# Patient Record
Sex: Male | Born: 1935 | Race: White | Hispanic: No | Marital: Married | Smoking: Never smoker
Health system: Southern US, Community
[De-identification: ages and names within clinical notes are randomized; demographics above are authoritative.]

## PROBLEM LIST (undated history)

## (undated) DIAGNOSIS — E669 Obesity, unspecified: Secondary | ICD-10-CM

## (undated) DIAGNOSIS — E875 Hyperkalemia: Secondary | ICD-10-CM

## (undated) DIAGNOSIS — Z96659 Presence of unspecified artificial knee joint: Secondary | ICD-10-CM

## (undated) DIAGNOSIS — T8459XA Infection and inflammatory reaction due to other internal joint prosthesis, initial encounter: Secondary | ICD-10-CM

## (undated) DIAGNOSIS — R001 Bradycardia, unspecified: Secondary | ICD-10-CM

## (undated) DIAGNOSIS — N183 Chronic kidney disease, stage 3 unspecified: Secondary | ICD-10-CM

## (undated) DIAGNOSIS — E785 Hyperlipidemia, unspecified: Secondary | ICD-10-CM

## (undated) DIAGNOSIS — K922 Gastrointestinal hemorrhage, unspecified: Secondary | ICD-10-CM

## (undated) DIAGNOSIS — I2510 Atherosclerotic heart disease of native coronary artery without angina pectoris: Secondary | ICD-10-CM

## (undated) DIAGNOSIS — F419 Anxiety disorder, unspecified: Secondary | ICD-10-CM

## (undated) DIAGNOSIS — I482 Chronic atrial fibrillation, unspecified: Secondary | ICD-10-CM

## (undated) DIAGNOSIS — D649 Anemia, unspecified: Secondary | ICD-10-CM

## (undated) DIAGNOSIS — N289 Disorder of kidney and ureter, unspecified: Secondary | ICD-10-CM

## (undated) DIAGNOSIS — I10 Essential (primary) hypertension: Secondary | ICD-10-CM

## (undated) DIAGNOSIS — E7800 Pure hypercholesterolemia, unspecified: Secondary | ICD-10-CM

## (undated) HISTORY — PX: CORONARY ARTERY BYPASS GRAFT: SHX141

## (undated) HISTORY — PX: TONSILLECTOMY: SUR1361

## (undated) HISTORY — DX: Hyperkalemia: E87.5

## (undated) HISTORY — PX: LEG SURGERY: SHX1003

## (undated) HISTORY — PX: APPENDECTOMY: SHX54

## (undated) HISTORY — PX: FOOT SURGERY: SHX648

## (undated) HISTORY — DX: Anemia, unspecified: D64.9

## (undated) HISTORY — DX: Disorder of kidney and ureter, unspecified: N28.9

## (undated) HISTORY — PX: JOINT REPLACEMENT: SHX530

---

## 1999-04-09 ENCOUNTER — Ambulatory Visit (HOSPITAL_COMMUNITY): Admission: RE | Admit: 1999-04-09 | Discharge: 1999-04-09 | Payer: Self-pay | Admitting: Cardiovascular Disease

## 2000-05-04 ENCOUNTER — Emergency Department (HOSPITAL_COMMUNITY): Admission: EM | Admit: 2000-05-04 | Discharge: 2000-05-04 | Payer: Self-pay | Admitting: Emergency Medicine

## 2000-06-24 ENCOUNTER — Encounter: Payer: Self-pay | Admitting: Physical Medicine and Rehabilitation

## 2000-06-24 ENCOUNTER — Ambulatory Visit (HOSPITAL_COMMUNITY)
Admission: RE | Admit: 2000-06-24 | Discharge: 2000-06-24 | Payer: Self-pay | Admitting: Physical Medicine and Rehabilitation

## 2000-07-08 ENCOUNTER — Encounter: Payer: Self-pay | Admitting: Physical Medicine and Rehabilitation

## 2000-07-08 ENCOUNTER — Ambulatory Visit (HOSPITAL_COMMUNITY)
Admission: RE | Admit: 2000-07-08 | Discharge: 2000-07-08 | Payer: Self-pay | Admitting: Physical Medicine and Rehabilitation

## 2001-11-02 ENCOUNTER — Ambulatory Visit (HOSPITAL_COMMUNITY)
Admission: RE | Admit: 2001-11-02 | Discharge: 2001-11-02 | Payer: Self-pay | Admitting: Physical Medicine and Rehabilitation

## 2001-11-03 ENCOUNTER — Encounter: Payer: Self-pay | Admitting: Physical Medicine and Rehabilitation

## 2001-11-03 ENCOUNTER — Encounter: Payer: Self-pay | Admitting: Cardiovascular Disease

## 2001-11-03 ENCOUNTER — Ambulatory Visit (HOSPITAL_COMMUNITY)
Admission: RE | Admit: 2001-11-03 | Discharge: 2001-11-03 | Payer: Self-pay | Admitting: Physical Medicine and Rehabilitation

## 2004-12-24 ENCOUNTER — Ambulatory Visit: Payer: Self-pay | Admitting: Gastroenterology

## 2006-09-14 ENCOUNTER — Encounter: Admission: RE | Admit: 2006-09-14 | Discharge: 2006-09-14 | Payer: Self-pay | Admitting: Orthopedic Surgery

## 2006-11-18 ENCOUNTER — Inpatient Hospital Stay (HOSPITAL_COMMUNITY): Admission: RE | Admit: 2006-11-18 | Discharge: 2006-11-26 | Payer: Self-pay | Admitting: Orthopedic Surgery

## 2009-01-27 IMAGING — CR DG CHEST 2V
2 series · 2 of 2 positions shown · non-contrast
Comparison: No prior studies are available for comparison.

CLINICAL DATA: Loose left THR. CABG. Some shortness of breath.
 CHEST - 2 VIEW:

[w chest pa *]
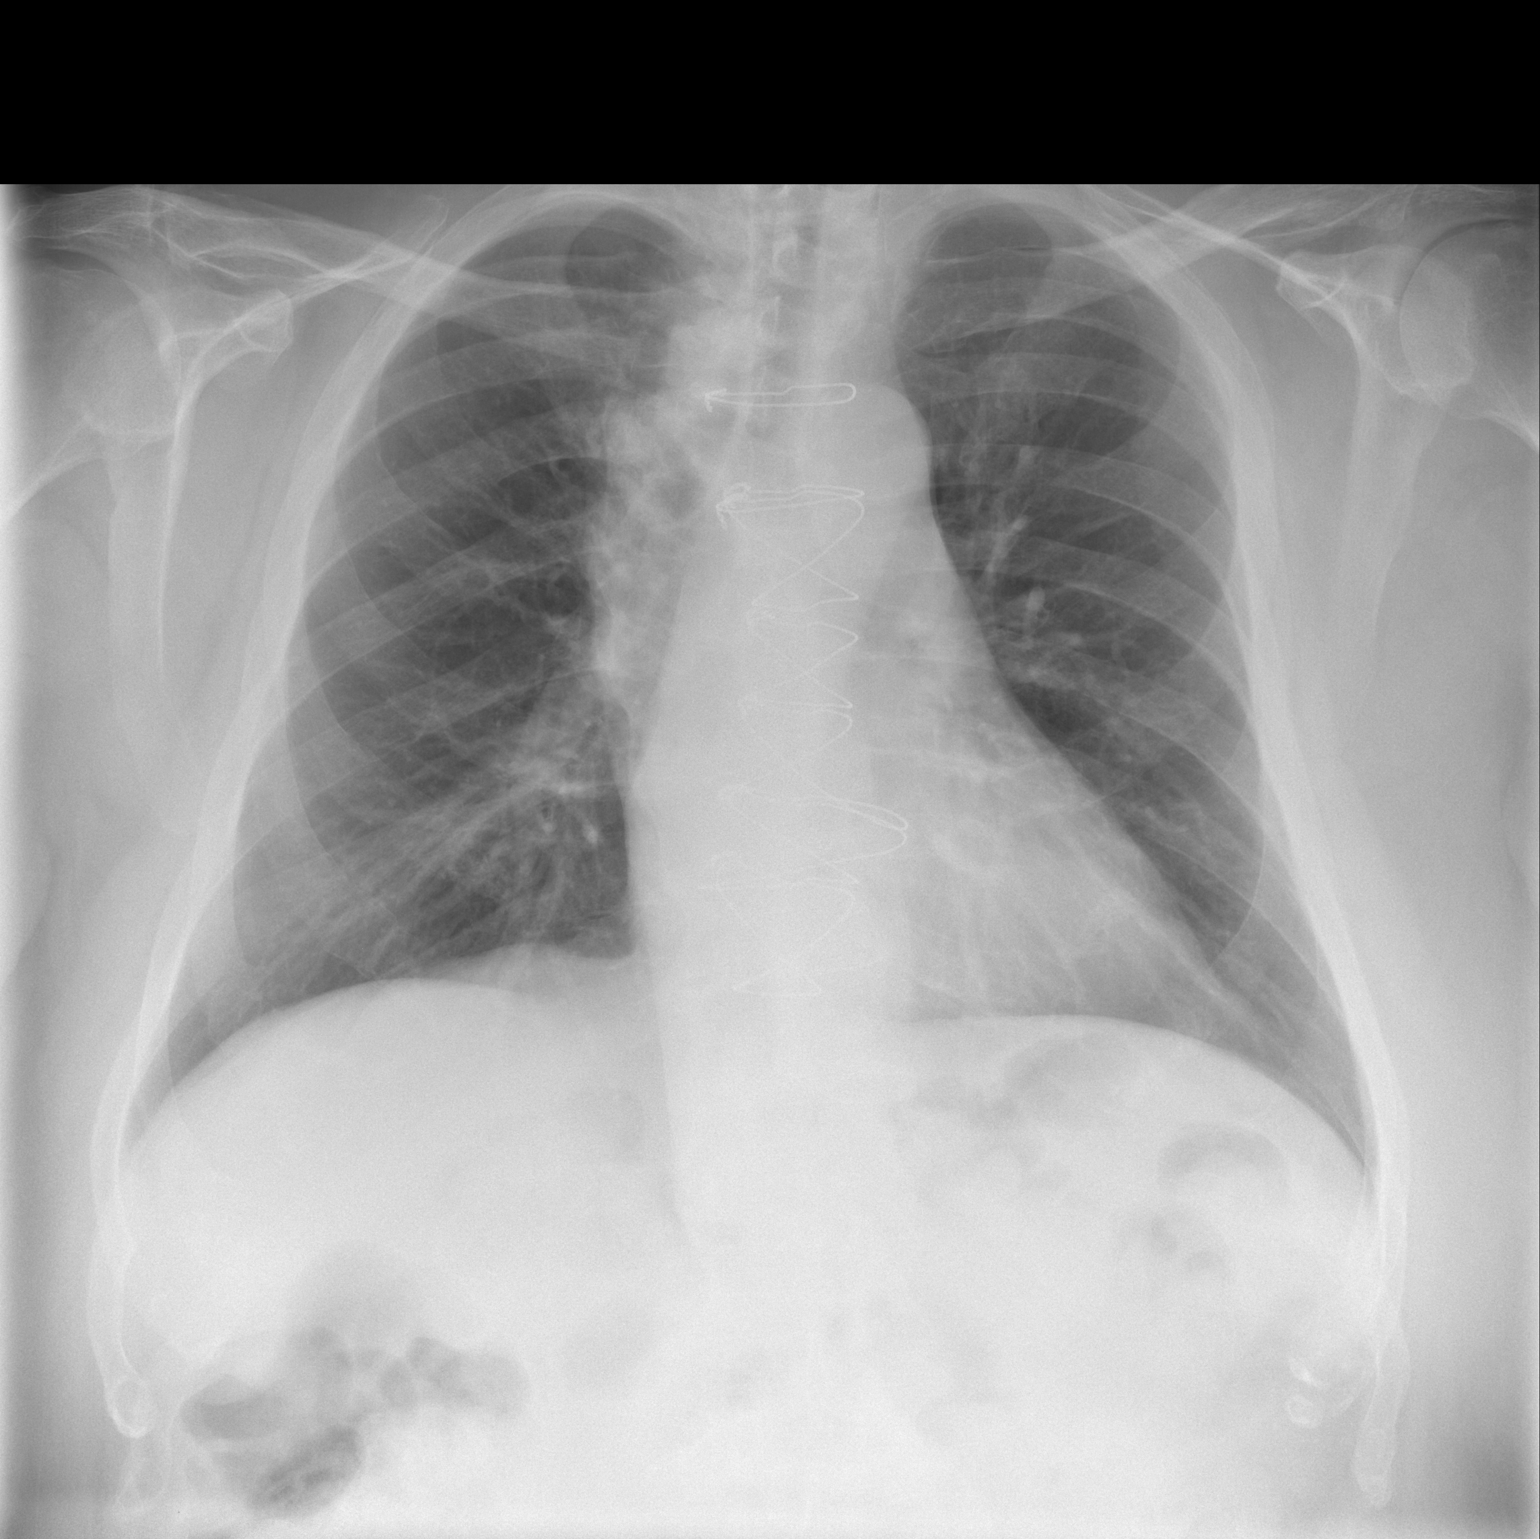

[w chest lat *]
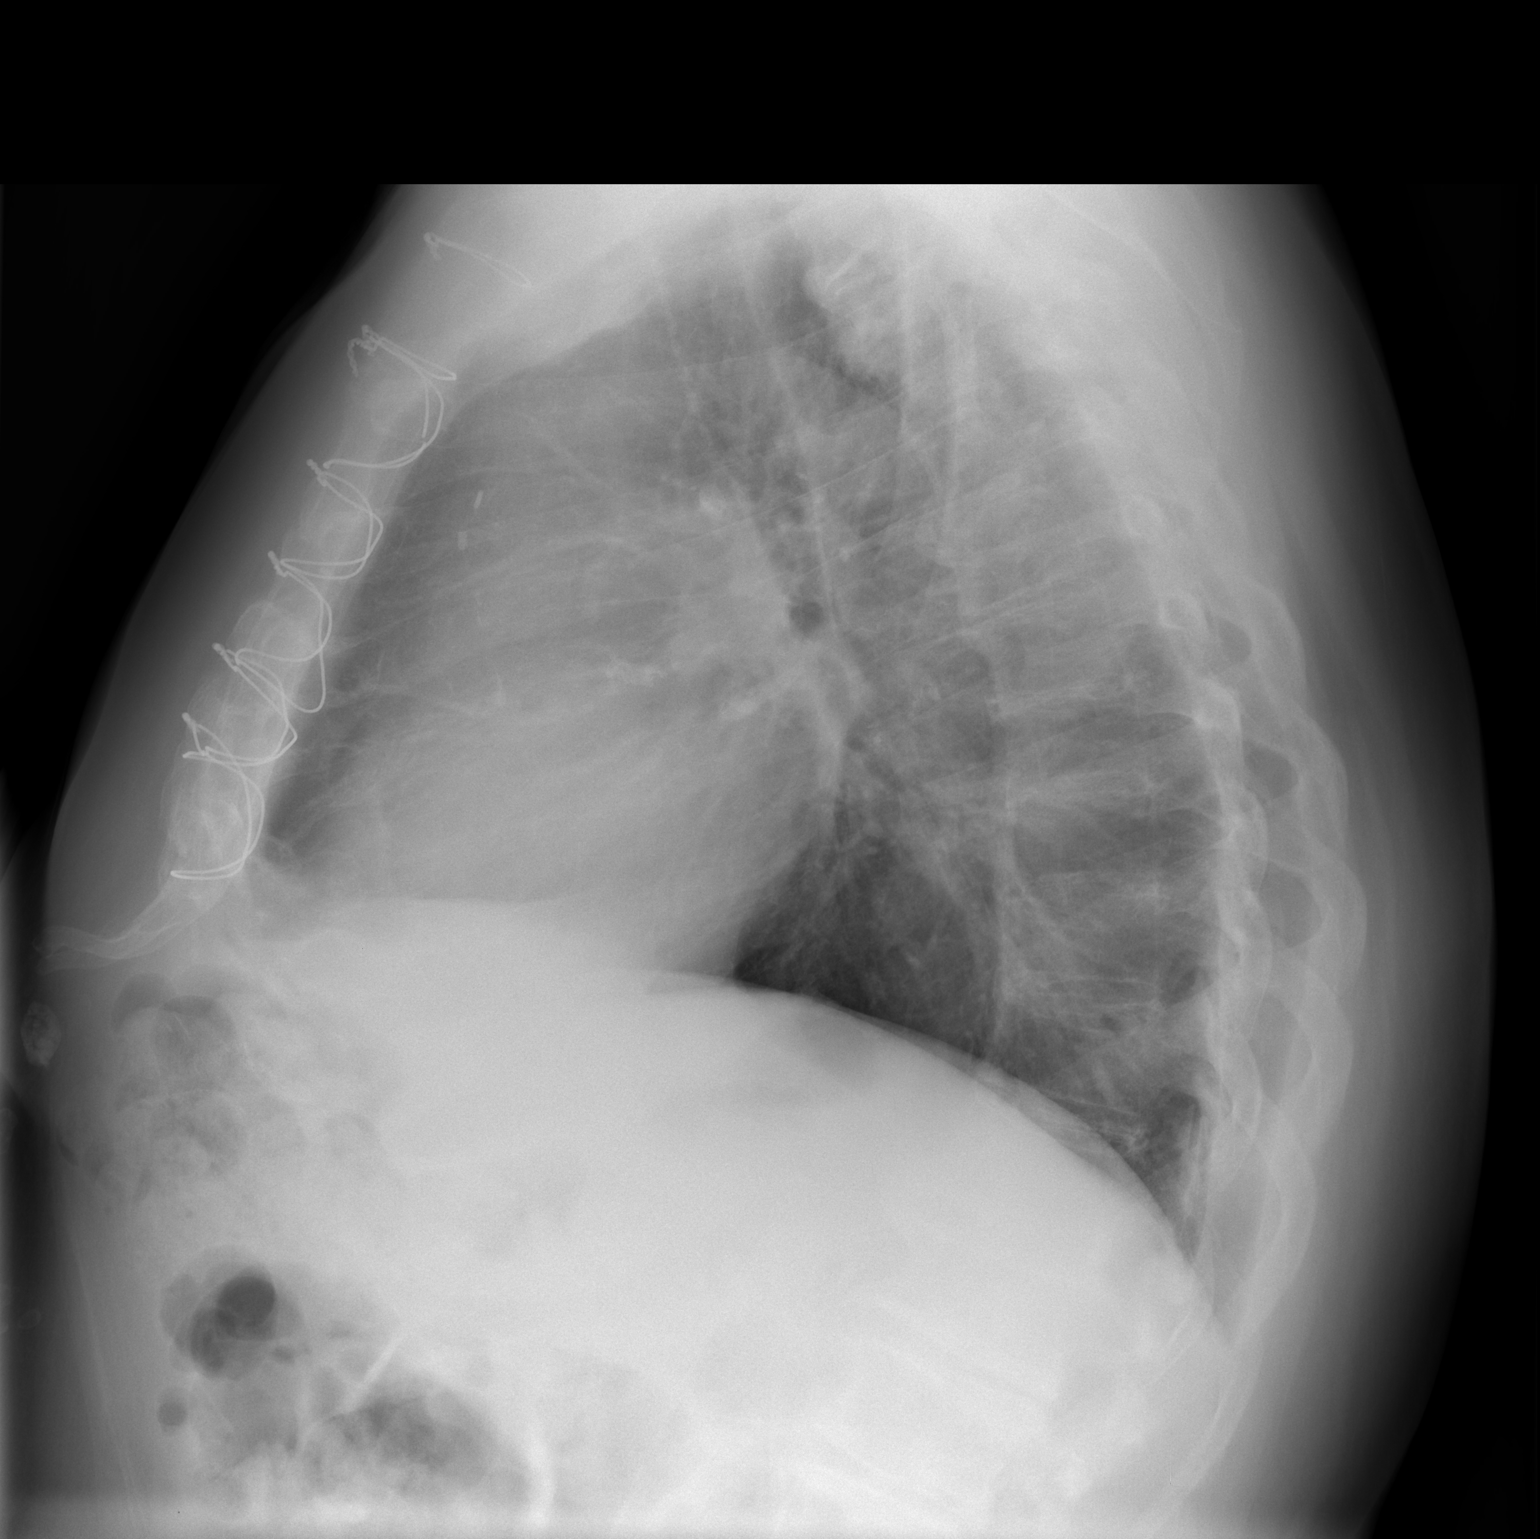

[2 of 2 positions shown; findings below may reference images not displayed]

FINDINGS: The cardiomediastinal silhouette appears unremarkable.  Cardiac size normal.  No pulmonary vascular congestion or active lung process.  Degenerative spondylotic changes in the thoracic spine. Sternal wire sutures and mediastinal clips.
IMPRESSION: No active chest disease. Moderate hyperaeration of the lungs.

## 2009-06-13 ENCOUNTER — Encounter: Admission: RE | Admit: 2009-06-13 | Discharge: 2009-06-13 | Payer: Self-pay | Admitting: Internal Medicine

## 2009-08-22 ENCOUNTER — Ambulatory Visit (HOSPITAL_COMMUNITY): Admission: RE | Admit: 2009-08-22 | Discharge: 2009-08-23 | Payer: Self-pay | Admitting: Orthopedic Surgery

## 2010-04-05 LAB — POCT I-STAT 4, (NA,K, GLUC, HGB,HCT)
Glucose, Bld: 89 mg/dL (ref 70–99)
Hemoglobin: 13.3 g/dL (ref 13.0–17.0)

## 2010-04-06 LAB — CBC
HCT: 36.4 % — ABNORMAL LOW (ref 39.0–52.0)
Hemoglobin: 12.1 g/dL — ABNORMAL LOW (ref 13.0–17.0)
MCH: 31.3 pg (ref 26.0–34.0)
MCHC: 33.2 g/dL (ref 30.0–36.0)
Platelets: 142 10*3/uL — ABNORMAL LOW (ref 150–400)
RBC: 3.86 MIL/uL — ABNORMAL LOW (ref 4.22–5.81)
RDW: 15.1 % (ref 11.5–15.5)
WBC: 6.1 10*3/uL (ref 4.0–10.5)

## 2010-04-06 LAB — DIFFERENTIAL
Eosinophils Relative: 4 % (ref 0–5)
Lymphocytes Relative: 17 % (ref 12–46)
Monocytes Absolute: 0.6 10*3/uL (ref 0.1–1.0)
Monocytes Relative: 10 % (ref 3–12)
Neutro Abs: 4.2 10*3/uL (ref 1.7–7.7)
Neutrophils Relative %: 68 % (ref 43–77)

## 2010-04-06 LAB — COMPREHENSIVE METABOLIC PANEL
ALT: 17 U/L (ref 0–53)
AST: 21 U/L (ref 0–37)
Albumin: 3.8 g/dL (ref 3.5–5.2)
Chloride: 109 mEq/L (ref 96–112)
GFR calc Af Amer: 44 mL/min — ABNORMAL LOW (ref >60)
GFR calc non Af Amer: 36 mL/min — ABNORMAL LOW (ref >60)
Glucose, Bld: 94 mg/dL (ref 70–99)
Total Bilirubin: <0.1 mg/dL — ABNORMAL LOW (ref 0.3–1.2)
Total Protein: 7 g/dL (ref 6.0–8.3)

## 2010-04-06 LAB — PROTIME-INR: Prothrombin Time: 14.2 seconds (ref 11.6–15.2)

## 2010-04-06 LAB — URINALYSIS, ROUTINE W REFLEX MICROSCOPIC
Bilirubin Urine: NEGATIVE
Hgb urine dipstick: NEGATIVE
Ketones, ur: NEGATIVE mg/dL
Nitrite: NEGATIVE
Urobilinogen, UA: 0.2 mg/dL (ref 0.0–1.0)
pH: 6 (ref 5.0–8.0)

## 2010-04-06 LAB — APTT: aPTT: 29 seconds (ref 24–37)

## 2010-04-06 LAB — SURGICAL PCR SCREEN: Staphylococcus aureus: NEGATIVE

## 2010-06-04 NOTE — Op Note (Signed)
NAMEKERMAN, PFOST                  ACCOUNT NO.:  [id number]   MEDICAL RECORD NO.:  [id number]          PATIENT TYPE:  INP   LOCATION:  1603                         FACILITY:  [facility]   PHYSICIAN:  Georges Lynch. Gioffre, M.D.DATE OF BIRTH:  [birth year]   DATE OF PROCEDURE:  11/18/2006  DATE OF DISCHARGE:                               OPERATIVE REPORT   SURGEON:  Georges Lynch. Darrelyn Hillock, M.D.   ASSISTANTS:  Jamelle Rushing, P.A., and Madlyn Frankel. Charlann Boxer, M.D.   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  He has had a left total knee (1995) and apparently a trailer hitch fell  and struck his knee and threw his knee into a deformed state and  loosened his prosthesis.   POSTOPERATIVE DIAGNOSIS:  Loosened and worn left total knee  arthroplasty.  He has had a left total knee (1995) and apparently a  trailer hitch fell and struck his knee and threw his knee into a  deformed state and loosened his prosthesis.   OPERATION:  1. Removal of a loosened and worn total knee arthroplasty on the left.  2. Insertion of a revision DePuy-type arthroplasty.  All three      components were cemented.  Note, we did cement the stems      bilaterally.   PROCEDURE:  Under general anesthesia, a routine orthopedic prep and  draping of the left lower extremity carried out.  The leg was  exsanguinated with an Esmarch.  The tourniquet was elevated to 350 mmHg.  An incision was made down through the old incision site.  We removed as  many as the old Ethibond sutures that we could at this time.  I then  carried out a median parapatellar incision.  I then had to do a  quadriceps snip and displaced the patella laterally.  A pin was placed  in the patellar tendon to prevent any dislodgement of the patellar  tendon from the tibial tubercle.  The knee was flexed and the implants  were noted to be loosened.  We easily removed the implants and the  cement.  We left the patella intact because the patella  was solidly  fixed.  Even though there were multiple fragments of bone which were old  fracture site, we decided not to remove that in order to disturb the  extensor mechanism.  We did debride the synovium around the patella.  At  this time we thoroughly curetted and debrided out knee first.  We  curetted out all the cystic changes in the tibia and femur.  We then  reamed our femur and tibia up in the usual procedure.  We decided to use  sleeves on both the femur and proximal tibia.  We utilized a modular  cemented stem, which was 13 x 30, for the femur.  The cement restricter  we used with a size 6 for the femur.  We utilized a TC3  femoral  component, a size 5.  The femoral adapter bolt was a +2 offset.  The  tibia cemented stem was a 15 x 60.  The tibia cement restricter was a  size 5.  Vancomycin was used in the cement.  The femoral sleeve was a  size 31 mm.  We utilized an 8-mm posterior augment on the femur.  This  was on the posterior medial posterior aspect of the femoral component.  We utilized a revision cemented mobile bearing tibial tray, size 4.  The  tibial sleeve was a size 29 mm.  The tibial insert was a size 5, 15-mm  thickness insert.    Under general anesthesia, routine orthopedic prep and draping of the  left lower extremity was carried out.  As I mentioned, the leg was  exsanguinated with Esmarch, tourniquet was elevated at 350 mmHg.  We  went down and did the procedures I mentioned above.  We did a median  parapatellar approach.  We did a quadriceps snip, reflected the patella  and removed all the components, curetted out the area.  We then did our  appropriate reaming for the femur and tibia in the usual fashion to  accommodate the above-mentioned sizes of the prostheses and stem.  We  obviously went through the trials.  We did utilize our broach for the  distal femur.  We also utilized a sleeve for distal femur, as I  mentioned, and the proximal tibia.  Once we  went through all the trials  and selected our sizes as mentioned above, we then removed the trial  components.  We did utilize a few pins for fixation purposes for our  lateral femoral condyle, which was fractured off from his fall.  We  anatomically reduced that and pinned it in place.  We then thoroughly  water-picked out the knee, cleaned the knee out, dried the knee out,  cemented both components in simultaneously.  We did cement both stems as  well.  Once the cement was hardened, we then removed all loose pieces of  cement.  We went through trials with the tibial insert, selected a 15-mm  thickness insert, size 5.  The synovium around the patella was debrided.  We inspected the patella.  It was solidly fixed so we decided not to  disturb it.  We thoroughly irrigated out the area, inserted a Hemovac  drain after we injected 30 mL of 0.25% Marcaine with Toradol.  We  injected that in the soft tissue structures.  The tourniquet then was  let down.  Following that we went ahead and inserted our Hemovac drain  and closed the knee in layers in the usual fashion.  Sterile dressings  were applied.  He had 1 g of IV vancomycin preop.           ______________________________  Georges Lynch. Darrelyn Hillock, M.D.     RAG/MEDQ  D:  11/18/2006  T:  11/18/2006  Job:  [id number]   cc:   Georga Hacking, M.D.  Fax: [phone]  Email: [email or web address]

## 2010-06-04 NOTE — H&P (Signed)
Joe [name], Joe [name]                  ACCOUNT NO.:  [id number]   MEDICAL RECORD NO.:  [id number]          PATIENT TYPE:  INP   LOCATION:  NA                           FACILITY:  [facility]   PHYSICIAN:  Georges Lynch. Gioffre, M.D.DATE OF BIRTH:  [birth year]   DATE OF ADMISSION:  11/18/2006  DATE OF DISCHARGE:                              HISTORY & PHYSICAL   CHIEF COMPLAINT:  Painful loss of range of motion, left total knee  arthroplasty.   HISTORY OF PRESENT ILLNESS:  The patient is a 75 year old gentleman with  a history of left total knee arthroplasty.  He states that, back in  August, he was doing some work, stepped off of trailer.  The hitch came  down, banged him in his left knee.  He had pain and swelling with range  of motion.  X-rays reveal that he has impaction of the femoral component  up into the femoral condyles with loss of space and a comminuted  fracture of his left patella.  The patient was allowed several months  for the patella to heel and will require revision of his left total knee  arthroplasty with complete revision.  The patient continues have pain  and loss of range of motion.   ALLERGIES:  BIAXIN.   CURRENT MEDICATIONS:  1. Aspirin 325 mg a day.  2. Isosorbide mononitrate 60 mg a day.  3. Lipitor 20 mg a day.  4. Mobic 15 mg a day.  Will stop today.  5. Singulair 10 mg once a day.  He has not used it recently.  6. Darvocet 1 or 2 tablets every 4 to 6 hours p.r.n. pain.  7. Percocet 5 mg, 1 or 2 tablets every 4 to 6 hours for severe pain.  8. Triamterine/hydrochlorothiazide 37.5/25 mg a day.  9. Alprazolam 0.05 mg q.h.s. p.r.n.  10.Nitroglycerin sublingual p.r.n.   PAST MEDICAL HISTORY:  1. Includes history of MI and CABG in 2000, with recent stress test      clearance for this upcoming surgery.  2. History of asthma.  3. History of a bilateral lower extremity staph infection.  4. Hypertension.  5. Hypercholesterolemia.   PAST SURGICAL HISTORY:  Includes back  surgery, tonsillectomy, carpal  tunnel release, bilateral knee replacements, coronary artery grafting.  The patient denies any complications of the above-mentioned surgical  procedures.   REVIEW OF SYSTEMS:  Is negative for any neurologic.  He has not had any  recent asthma attacks, the last was greater than 1 year ago.  He has  never been hospitalized.  CARDIOVASCULAR:  He states he has used one  nitroglycerin, probably a couple months ago.  He used it very  infrequently.  No recent chest pain, shortness of breath.  GI:  He  denies any ulcers.  No gallstones, liver problems, hepatitis.  GU:  He  just has weak stream.  He has never been treated for BPH.  No frequent  infections or kidney stones.  He denies any endocrine. HEMATOLOGIC:  He  has had blood transfusions with his knee replacements and heart surgery  in the  past, without any untoward events.   SOCIAL HISTORY:  The patient is married, lives with his wife.  He is  partially retired.  Drinks about one beer a day.  Does not smoke will  have his wife care for him post-op on a one-story house.   FAMILY MEDICAL HISTORY:  Mother is deceased from history of heart  disease and infection.  Father is deceased from Alzheimer's  complications.   PHYSICAL EXAM:  VITALS:  Height is 580, weight is 265, blood pressure is  178/82, re-checked unchanged.  Pulse of 70, respirations 12.  Patient is  afebrile.  GENERAL:  This is a centrally obese gentleman, conscious, alert and  appropriate with a knee brace on his left knee, ambulating with a cane  in his right hand.  HEENT:  Head was normocephalic.  Pupils equal, round and reactive.  Extraocular movements intact.  Oral buccal mucosa is pink and moist.  Gross hearing is intact.  NECK:  Was supple.  No palpable lymphadenopathy.  Thyroid region was  nontender.  Good range of motion.  CHEST:  Lung sounds were clear and equal bilaterally.  No wheezes,  rales, rhonchi.  HEART:  Regular rate and  rhythm.  He had a well-healed midline surgical  incision.  ABDOMEN:  Round, obese, soft, nontender.  Bowel sounds present.  EXTREMITIES:  Upper extremities had good range of motion, no __________  .  He had 5/5 motor strength.  LOWER EXTREMITIES:  Right left hip had full extension, flexion up to 120  with 20 degrees internal-external rotation without any discomfort.  Bilateral knees had well-healed midline surgical incisions.  Left knee:  He had no effusion.  No erythema or ecchymosis.  He was able to extend  it about 15 degrees short of full extension, flexes it back to about 80  degrees.  He did have some slight play with valgus varus stressing.  Right knee:  He was able to fully extend it, flex it back to 115  degrees.  No instability.  Calves were soft and nontender.  He had  multiple varicosities in lower extremity.  Ankles with good motion.  PERIPHERAL VASCULAR:  Carotid pulses were 2+, no bruits.  Radial pulses  were 2+.  Dorsalis pedis pulses were 1+.  He had 1+ pitting edema in the  left lower extremity.  He had multiple varicosities.  NEURO:  The patient is conscious, alert and appropriate nose.  Gross  neurologic defects noted.  BREAST, RECTAL AND GENITOURINARY:  Exams were deferred at this time.   IMPRESSION:  1. Loosening with impacted left femoral component in a total knee      arthroplasty, requiring revision.  2. Hypertension.  3. Coronary artery disease with history of coronary artery bypass      graft, with recent stress test for clearance.  4. History of asthma.  5. History of hypercholesterolemia.   PLAN:  The patient will undergo all routine labs and tests, prior to  having a revision of his left total knee arthroplasty by Dr. Ciro Backer at  [facility] on November 18, 2006.  The patient has had a recent  normal two-day adenosine Cardiolite scan, with no evidence of ischemia  or infarction, normal ejection fraction of 57%, with normal wall motion  and  thickening.  This was performed by Dr. Donnie Aho, his primary care  physician, Dr. Jacky Kindle.      Jamelle Rushing, P.A.    ______________________________  Georges Lynch Darrelyn Hillock, M.D.  RWK/MEDQ  D:  11/09/2006  T:  11/09/2006  Job:  [id number]   cc:   Georges Lynch. Darrelyn Hillock, M.D.  Fax: [phone]

## 2010-06-07 NOTE — Discharge Summary (Signed)
Joe [name], Joe [name]                  ACCOUNT NO.:  [id number]   MEDICAL RECORD NO.:  [id number]          PATIENT TYPE:  INP   LOCATION:  1603                         FACILITY:  [facility]   PHYSICIAN:  Georges Lynch. Gioffre, M.D.DATE OF BIRTH:  1935/01/21   DATE OF ADMISSION:  11/18/2006  DATE OF DISCHARGE:  11/26/2006                               DISCHARGE SUMMARY   ADMISSION DIAGNOSES:  __________  Blood-loss anemia, six units of packed red blood cells.  Hypertension.  Coronary artery disease, status post coronary artery bypass grafting.  __________   This patient is a 75 year old __________   SURGICAL PROCEDURE:  On November 18, 2006, patient was taken to the OR  __________  Blood loss.  He was given 2 units of packed red cells during the  procedure, due to the fact that he __________  probably bleed  excessively both postoperative and the condition of coronary artery  disease.   Tibial tray, size 15 X 60 tibial cemented stem, 29 mm tray sleeve and 31  mm femoral sleeve, a size 5 15 mm polyethylene bearing of polymethyl  methacrylate.   CONSULTATIONS:  __________  request for physical therapy consultation  __________   HOSPITAL COURSE:  On November 18, 2006, patient was taken to the OR for  revision of his left total knee arthroplasty.  The patient tolerated it  well.  He did receive two units of packed red blood cells during the  procedure.  Patient then did incur two more episodes of postoperative  blood-loss anemia.  Initially, his first postoperative day, his  hemoglobin dropped to 8.8, so he was typed and crossed and transfused  two units of packed red blood cells.  Then just prior to discharge, his  hemoglobin also dropped down to 6.8 and he was typed and crossed and  transfused two more units.  On discharge, his hemoglobin was at 11 with  hematocrit of 37__________  the patient's INR did become slightly  supratherapeutic, as suggested by the pharmacy, it was 2.9 on the date  of discharge.  Patient otherwise had a significant amount of bleeding  the first few days, due to the extensive revision in his knee and this  was felt to be expected.  Patient had no signs of infection __________  Robaxin to be intact.  Patient was able to transition from IV  medications to p.o. meds.  Once the patient had his hemoglobin  stabilized and anticipated by physical therapy, he was felt to be stable  for discharge home.  He was stable and in good condition.  Arrangements  were made for outpatient physical therapy and __________  .   LABS:  CBC on admission:  WBC 6.2, hemoglobin 13, __________ 58.1,  platelets 276.  The first postoperative day, hemoglobin dropped to 8.8.  he was transfused 2 units of packed red blood cells and did improve and,  just prior to discharge, his hemoglobin dropped to 6.8/19.8, transfused  2 more units of packed red blood cells and improved to 11/30.7.  The  patient's INR was 2.9 the date of discharge.  The pharmacy had suggested  that dosage.  The patient's chemistries on November 3 were sodium 134,  potassium 4, glucose 109, BUN 16, creatinine 1.2, GFR greater than 60.  Patient received a total of 6 units of packed red blood cells during  this hospitalization.   Patient's intraoperative specimens that were sent to the pathology lab  for evaluation for possible infection showed no signs of any growth or  organisms.   DISCHARGE INSTRUCTIONS:  DIET:  No restrictions.  ACTIVITY:  Patient is to slowly increase activity, walk with assistance  of a walker.  WOUND CARE:  Patient is to change his dressing daily.  FOLLOWUP:  Patient is to follow up with Dr. Darrelyn Hillock two weeks from  discharge.  Patient is to call [phone] for an appointment.   MEDICATIONS UPON DISCHARGE:  1. Percocet 10/650 one tablet every 4-6 hours for pain, if needed.  2. Coumadin 5 mg one tablet a day unless changed by pharmacy.  3. Robaxin 500 mg one tablet every 6 hours for muscle  spasms, if      needed, every 6 hours.  4. Colchicine 0.6 mg twice a day.  5. Lipitor 20 mg a day.  6. Triamterene/hydrochlorothiazide 37.5/25 mg p.r.n.  7. Isosorbide mononitrate 60 mg once a day.  8. Darvocet on hold.  9. Alprazolam 0.25 mg as needed.  10.NitroQuick sublingual 0.4 mg as needed.  11.Mobic is to be on hold until done with Coumadin.   CONDITION ON DISCHARGE:  The patient's condition upon discharge to home  is listed as improved and good.      Jamelle Rushing, P.A.    ______________________________  Georges Lynch Darrelyn Hillock, M.D.    RWK/MEDQ  D:  12/09/2006  T:  12/09/2006  Job:  [id number]

## 2010-06-07 NOTE — Cardiovascular Report (Signed)
[place]  Patient:    Joe [name], Joe [name]                         MRN: [id number] Proc. Date: 04/09/99 Adm. Date:  [id number] Disc. Date: [id number] Attending:  Koren Bound CC:         Cardiac Catheterization Laboratory             Richard A. Jacky Kindle, M.D.                        Cardiac Catheterization  PROCEDURE:  Heart catheterization.  CARDIOLOGIST:  Alvia Grove., M.D.  INDICATIONS:  Joe [name] is a 75 year old gentleman with a history of a coronary artery bypass grafting at [facility].  He had two-vessel coronary artery disease by heart catheterization back in 1995.  He went to [facility] and had a saphenous vein graft to his diagonal and LAD sequentially, and then a saphenous vein graft to his right coronary artery.  He presented to the office last week ith a months long history of progressive chest tightness and shortness of breath. e is referred for a heart catheterization, for further evaluation.  DESCRIPTION OF PROCEDURE:  The right femoral artery was cannulated using a modified Seldinger technique.  HEMODYNAMICS: LV pressure:  125/16. Aortic pressure:  121/71.  ANGIOGRAPHY: 1. Left main coronary artery:  The left main is rather long. 2. Left anterior descending coronary artery:  The LAD is occluded proximally. 3. Circumflex coronary artery:  The circumflex artery has only minor luminal    irregularities.  There is an OM-I that is fairly normal. 4. Right coronary artery:  Is occluded proximally. 5. Saphenous vein graft to the first diagonal artery and then the left anterior    descending coronary artery:  Is fairly normal.  The anastomosis to the first    diagonal artery has minor irregularities, but no critical stenosis.  The    anastomosis to the LAD is normal.  Flow down into the LAD is quite normal,    and this vessel appears to be normal distal to the graft.  It is clear to    say that the  LAD has an 80% stenosis in the very proximal aspect of this    vessel as it fills in a retrograde fashion. 6. Saphenous vein graft to the right coronary artery:  Is a very large and    somewhat ectatic vessel.  It was fairly difficult to fill this large artery    with the catheter, but the flow through it appears to be normal.  This graft    appears to be 5.0 to 6.0 mm in diameter.  The anastomosis to the distal    right coronary artery appears to be normal.  The flow into the PDA and    posterior lateral segment artery is normal.  LEFT VENTRICULOGRAM:  Is performed in a 30-degree RAO position.  It reveals normal left ventricular systolic function with an ejection fraction of around 65%-70%.  The heart is mildly dilated.  COMPLICATIONS:  None.  CONCLUSIONS: 1. Patent saphenous vein grafts to the diagonal and left anterior descending    coronary artery, and a patent saphenous vein graft to the right coronary    artery. 2. Mildly dilated but rather normally-functioning left ventricle. 3. Morbid obesity.  PLAN:  We will continue with medical therapy and weight loss therapy for [name].DD:  04/09/99 TD:  04/10/99 Job: [id number]

## 2010-06-07 NOTE — Discharge Summary (Signed)
NAMEALEEM, ELZA                  ACCOUNT NO.:  [id number]   MEDICAL RECORD NO.:  [id number]          PATIENT TYPE:  INP   LOCATION:  1603                         FACILITY:  [facility]   PHYSICIAN:  Georges Lynch. Gioffre, M.D.DATE OF BIRTH:  [birth year]   DATE OF ADMISSION:  11/18/2006  DATE OF DISCHARGE:  11/26/2006                               DISCHARGE SUMMARY   DISPOSITION:  To home.   ADMISSION DIAGNOSES:  1. Loosening with impaction of left femoral component in a total knee      arthroplasty requiring revision.  2. Hypertension.  3. Coronary artery disease with recent coronary artery bypass graft      with recent stress test for clearance.  4. History of asthma.  5. History of hypercholesterolemia.   DISCHARGE DIAGNOSES:  1. Revision of his left total knee arthroplasty.  2. Postoperative blood loss anemia requiring 2 units of packed red      blood cells without any untoward events.  3. History of hypertension.  4. History of coronary artery disease with coronary artery bypass      graft.  5. History of asthma.  6. History of hypercholesterolemia.   HISTORY OF PRESENT ILLNESS:  The patient is a 75 year old gentleman with  a left total knee arthroplasty by Dr. Darrelyn Hillock.  He states back in  August, he was doing some work when he stepped off the trail and the  hitch came down, banging his left knee.  The patient had pain and  swelling with range of motion.  X-rays revealed loosening with the total  knee arthroplasty components with impaction of the femoral component  into the femoral condyle.  The patient had a comminuted fracture of the  patella.  The patient was allowed several months for the patella to heel  and requires total knee arthroplasty revision.   ALLERGIES:  BIAXIN.   MEDICATIONS ON ADMISSION:  1. Aspirin 325 mg a day.  2. Isosorbide mononitrate 60 mg a day.  3. Lipitor 20 mg a day.  4. Mobic 15 mg a day.  5. Singulair 10 mg a day which he has not used  recently.  6. Darvocet 1-2 tablets every 4-6 hours for pain.  7. Percocet 5 mg 1-2 tablets every 4-6 hours for severe pain.  8. Triamterene/hydrochlorothiazide 37.5/25 mg a day.  9. Alprazolam 0.05 mg every bedtime p.r.n.  10.Nitroglycerin sublingual p.r.n.   SURGICAL PROCEDURE:  On November 18, 2006, the patient was taken to the  OR by Dr. Worthy Rancher, assisted by Dr. Lajoyce Corners and Oneida Alar, [facility].  Under general anesthesia, the patient underwent a revision of his left  total knee arthroplasty.  The patient tolerated the procedure well.  There were no complications, minimal blood loss.  The patient was  transferred to the recovery room and then to orthopedic floor in good  condition.  The patient had the following components implanted:  a size  5 left femoral component with a size 30 x 13 stem, a size 15 x 16 tibial  stem, a size 4 revision cemented tibial tray, size 15 mm polyethylene  bearing.  He also had multiple augment screws and sleeves.  The  components were implanted with methyl methacrylate with vancomycin  impregnated.   CONSULTATION:  The following routine consults requested:  Physical  therapy, Case Management, Pharmacy.   HOSPITAL COURSE:  On November 18, 2006, the patient was admitted to  [facility] under the care of Dr. Worthy Rancher.  The patient was  taken to the OR where revision of his left total knee arthroplasty was  performed without any complications.  The patient was transferred to the  recovery room and then to the orthopedic floor in good condition on IV  pain medicines, antibiotics, and Coumadin for DVT prophylaxis.  The  patient was to follow with total knee protocol.  The patient spent  several days on the orthopedic floor without any significant untoward  events.  He did have some postoperative blood loss anemia where his  hemoglobin did drop to 7.2, so he was typed, crossed, and transfused 2  units of packed red blood cells without any untoward  events.  His  hemoglobin did improve to 10.  The patient's wound remained benign for  any signs of infection, and his leg remained neuromotorvascularly  intact.  He worked well with physical therapy.  The patient was able  transition from IV medications to oral medications well without any  complications, and it was felt that on postoperative day  seven, he was orthopedically and medically ready for discharge home,and  Dr. Darrelyn Hillock discharged him in good condition.   LABS:  CBC on admission found WBC 6.2 with a hemoglobin of 13 with a  hematocrit 38.1, platelets 176,000.  On November 21, 2006, his hemoglobin  dropped to 7.2.  He was typed, crossed, and transfused 2 units of packed  red blood cells.  Upon discharge, his hemoglobin improved to 11 with  31.7.  His INR was 2.9 on the date of discharge, and Pharmacy was  adjusting his Coumadin.  Routine chemistries on admission found sodium  of 138, potassium of 4.4, glucose 94, BUN 14, creatinine 1.16.  Estimated GFR was greater than 60.  Specimens sent during the revision  showed no organisms grown.  The patient was given a total of 6 units of  packed red blood cells during his postoperative course including 2 units  during the surgical procedure.   DISCHARGE INSTRUCTIONS:  1. Diet:  No restrictions.  2. Wound care:  The patient is to change the dressing daily.  3. Activity:  The patient is to increase his activity slowly with the      assistance of a walker.  4. Follow-up:  The patient needs a follow-up appointment Dr. Darrelyn Hillock      in the office two weeks from date of discharge.  The patient is to      call [phone] for that follow-up appointment.  5. Home health care and Coumadin monitoring through [facility].   MEDICATIONS UPON DISCHARGE:  1. Percocet 10/650 one tablet every 4-6 hours for pain if needed.  2. Coumadin 5 mg once a day unless changed by the [facility] pharmacist.  3. Robaxin 500 mg once every 6 hours for muscle spasms if  needed.  4. Lipitor 20 mg a day.  5. Triamterene/hydrochlorothiazide 37.5/25 mg p.r.n.  6. Isosorbide mononitrate 60 mg once a day.  7. Alprazolam 0.25 mg p.r.n.  8. Nitroglycerin sublingual p.r.n.  9. Mobic 15 mg once a day to be placed on hold until he is completed  off his Coumadin.  10.Colchicine 0.6 mg twice a day.   CONDITION:  The patient's condition upon discharge to home is listed  improved and good.      Jamelle Rushing, P.A.    ______________________________  Georges Lynch Darrelyn Hillock, M.D.    RWK/MEDQ  D:  01/12/2007  T:  01/12/2007  Job:  [id number]   cc:   [name] A. Darrelyn Hillock, M.D.  Fax: [phone]

## 2010-10-29 LAB — CROSSMATCH: ABO/RH(D): A POS

## 2010-10-29 LAB — HEMOGLOBIN AND HEMATOCRIT, BLOOD
HCT: 31.4 — ABNORMAL LOW
Hemoglobin: 10.9 — ABNORMAL LOW
Hemoglobin: 7.2 — CL

## 2010-10-29 LAB — CBC
HCT: 24 — ABNORMAL LOW
MCHC: 34.5
MCHC: 34.7
Platelets: 190
RBC: 2.64 — ABNORMAL LOW
WBC: 9.6

## 2010-10-29 LAB — BASIC METABOLIC PANEL
Chloride: 100
GFR calc Af Amer: >60
GFR calc non Af Amer: >60
Potassium: 4
Sodium: 134 — ABNORMAL LOW

## 2010-10-29 LAB — PREPARE RBC (CROSSMATCH)

## 2010-10-29 LAB — PROTIME-INR
INR: 1.8 — ABNORMAL HIGH
INR: 2.9 — ABNORMAL HIGH
Prothrombin Time: 28.8 — ABNORMAL HIGH

## 2010-10-30 LAB — COMPREHENSIVE METABOLIC PANEL
ALT: 15
CO2: 26
Calcium: 9.8
Creatinine, Ser: 1.16
GFR calc non Af Amer: >60
Glucose, Bld: 94
Sodium: 138
Total Bilirubin: 0.7

## 2010-10-30 LAB — TYPE AND SCREEN: Antibody Screen: NEGATIVE

## 2010-10-30 LAB — CBC
Hemoglobin: 13
MCHC: 34
Platelets: 176
RBC: 4.21 — ABNORMAL LOW
RDW: 14.7 — ABNORMAL HIGH
WBC: 6.2

## 2010-10-30 LAB — URINALYSIS, ROUTINE W REFLEX MICROSCOPIC
Bilirubin Urine: NEGATIVE
Ketones, ur: NEGATIVE
Protein, ur: NEGATIVE
Urobilinogen, UA: 0.2

## 2010-10-30 LAB — PROTIME-INR
INR: 1
INR: 1.2
Prothrombin Time: 13.1
Prothrombin Time: 15

## 2010-10-30 LAB — HEMOGLOBIN AND HEMATOCRIT, BLOOD
HCT: 25.8 — ABNORMAL LOW
HCT: 27.3 — ABNORMAL LOW
HCT: 33 — ABNORMAL LOW
Hemoglobin: 11.1 — ABNORMAL LOW

## 2010-10-30 LAB — ANAEROBIC CULTURE

## 2010-10-30 LAB — DIFFERENTIAL
Basophils Absolute: 0
Basophils Relative: 1
Eosinophils Absolute: 0.2
Lymphs Abs: 1.4
Neutrophils Relative %: 61

## 2010-10-30 LAB — BODY FLUID CULTURE

## 2010-10-30 LAB — GRAM STAIN

## 2011-01-28 ENCOUNTER — Other Ambulatory Visit: Payer: Self-pay | Admitting: Cardiology

## 2011-04-11 ENCOUNTER — Encounter (HOSPITAL_COMMUNITY): Payer: Self-pay | Admitting: Pharmacy Technician

## 2011-04-15 ENCOUNTER — Encounter (HOSPITAL_BASED_OUTPATIENT_CLINIC_OR_DEPARTMENT_OTHER): Payer: Self-pay

## 2011-04-15 ENCOUNTER — Encounter (HOSPITAL_COMMUNITY)
Admission: RE | Admit: 2011-04-15 | Discharge: 2011-04-15 | Disposition: A | Discharge status: Home / Self Care | Payer: Medicare Other | Source: Ambulatory Visit | Attending: Orthopedic Surgery | Admitting: Orthopedic Surgery

## 2011-04-15 ENCOUNTER — Ambulatory Visit (HOSPITAL_BASED_OUTPATIENT_CLINIC_OR_DEPARTMENT_OTHER): Admit: 2011-04-15 | Payer: Self-pay | Admitting: Orthopedic Surgery

## 2011-04-15 ENCOUNTER — Inpatient Hospital Stay (HOSPITAL_COMMUNITY)
Admission: EM | Admit: 2011-04-15 | Discharge: 2011-04-28 | DRG: 579 | Disposition: A | Discharge status: Home Health | Payer: Medicare Other | Attending: Internal Medicine | Admitting: Internal Medicine

## 2011-04-15 ENCOUNTER — Encounter (HOSPITAL_COMMUNITY): Payer: Self-pay | Admitting: *Deleted

## 2011-04-15 ENCOUNTER — Other Ambulatory Visit: Payer: Self-pay

## 2011-04-15 ENCOUNTER — Inpatient Hospital Stay (HOSPITAL_COMMUNITY): Payer: Medicare Other

## 2011-04-15 ENCOUNTER — Emergency Department (HOSPITAL_COMMUNITY): Payer: Medicare Other

## 2011-04-15 DIAGNOSIS — Z951 Presence of aortocoronary bypass graft: Secondary | ICD-10-CM

## 2011-04-15 DIAGNOSIS — N179 Acute kidney failure, unspecified: Secondary | ICD-10-CM | POA: Diagnosis present

## 2011-04-15 DIAGNOSIS — T45515A Adverse effect of anticoagulants, initial encounter: Secondary | ICD-10-CM | POA: Diagnosis present

## 2011-04-15 DIAGNOSIS — L03119 Cellulitis of unspecified part of limb: Principal | ICD-10-CM | POA: Diagnosis present

## 2011-04-15 DIAGNOSIS — N189 Chronic kidney disease, unspecified: Secondary | ICD-10-CM | POA: Diagnosis present

## 2011-04-15 DIAGNOSIS — R791 Abnormal coagulation profile: Secondary | ICD-10-CM | POA: Diagnosis present

## 2011-04-15 DIAGNOSIS — L03116 Cellulitis of left lower limb: Secondary | ICD-10-CM

## 2011-04-15 DIAGNOSIS — K264 Chronic or unspecified duodenal ulcer with hemorrhage: Secondary | ICD-10-CM | POA: Diagnosis present

## 2011-04-15 DIAGNOSIS — K449 Diaphragmatic hernia without obstruction or gangrene: Secondary | ICD-10-CM | POA: Diagnosis present

## 2011-04-15 DIAGNOSIS — K922 Gastrointestinal hemorrhage, unspecified: Secondary | ICD-10-CM

## 2011-04-15 DIAGNOSIS — R112 Nausea with vomiting, unspecified: Secondary | ICD-10-CM | POA: Diagnosis present

## 2011-04-15 DIAGNOSIS — M7989 Other specified soft tissue disorders: Secondary | ICD-10-CM

## 2011-04-15 DIAGNOSIS — Y831 Surgical operation with implant of artificial internal device as the cause of abnormal reaction of the patient, or of later complication, without mention of misadventure at the time of the procedure: Secondary | ICD-10-CM | POA: Diagnosis present

## 2011-04-15 DIAGNOSIS — I2510 Atherosclerotic heart disease of native coronary artery without angina pectoris: Secondary | ICD-10-CM | POA: Diagnosis present

## 2011-04-15 DIAGNOSIS — R350 Frequency of micturition: Secondary | ICD-10-CM | POA: Diagnosis present

## 2011-04-15 DIAGNOSIS — K920 Hematemesis: Secondary | ICD-10-CM

## 2011-04-15 DIAGNOSIS — T8450XA Infection and inflammatory reaction due to unspecified internal joint prosthesis, initial encounter: Secondary | ICD-10-CM | POA: Diagnosis present

## 2011-04-15 DIAGNOSIS — D62 Acute posthemorrhagic anemia: Secondary | ICD-10-CM

## 2011-04-15 DIAGNOSIS — Z96659 Presence of unspecified artificial knee joint: Secondary | ICD-10-CM

## 2011-04-15 DIAGNOSIS — I129 Hypertensive chronic kidney disease with stage 1 through stage 4 chronic kidney disease, or unspecified chronic kidney disease: Secondary | ICD-10-CM | POA: Diagnosis present

## 2011-04-15 DIAGNOSIS — I498 Other specified cardiac arrhythmias: Secondary | ICD-10-CM | POA: Diagnosis present

## 2011-04-15 DIAGNOSIS — E875 Hyperkalemia: Secondary | ICD-10-CM | POA: Diagnosis present

## 2011-04-15 DIAGNOSIS — E669 Obesity, unspecified: Secondary | ICD-10-CM | POA: Diagnosis present

## 2011-04-15 DIAGNOSIS — M79609 Pain in unspecified limb: Secondary | ICD-10-CM

## 2011-04-15 DIAGNOSIS — I4891 Unspecified atrial fibrillation: Secondary | ICD-10-CM | POA: Diagnosis present

## 2011-04-15 DIAGNOSIS — L02419 Cutaneous abscess of limb, unspecified: Principal | ICD-10-CM | POA: Diagnosis present

## 2011-04-15 HISTORY — DX: Pure hypercholesterolemia, unspecified: E78.00

## 2011-04-15 HISTORY — DX: Obesity, unspecified: E66.9

## 2011-04-15 HISTORY — DX: Essential (primary) hypertension: I10

## 2011-04-15 HISTORY — DX: Anxiety disorder, unspecified: F41.9

## 2011-04-15 LAB — CBC
HCT: 40.3 % (ref 39.0–52.0)
Hemoglobin: 13.7 g/dL (ref 13.0–17.0)
MCH: 30 pg (ref 26.0–34.0)
MCHC: 34 g/dL (ref 30.0–36.0)
MCV: 88.4 fL (ref 78.0–100.0)
Platelets: 256 10*3/uL (ref 150–400)
RBC: 4.56 MIL/uL (ref 4.22–5.81)
RDW: 14.7 % (ref 11.5–15.5)
WBC: 20 10*3/uL — ABNORMAL HIGH (ref 4.0–10.5)

## 2011-04-15 LAB — COMPREHENSIVE METABOLIC PANEL
ALT: 73 U/L — ABNORMAL HIGH (ref 0–53)
AST: 75 U/L — ABNORMAL HIGH (ref 0–37)
Albumin: 2.7 g/dL — ABNORMAL LOW (ref 3.5–5.2)
Alkaline Phosphatase: 208 U/L — ABNORMAL HIGH (ref 39–117)
BUN: 71 mg/dL — ABNORMAL HIGH (ref 6–23)
CO2: 18 mEq/L — ABNORMAL LOW (ref 19–32)
Calcium: 9.6 mg/dL (ref 8.4–10.5)
Chloride: 97 mEq/L (ref 96–112)
Creatinine, Ser: 2.29 mg/dL — ABNORMAL HIGH (ref 0.50–1.35)
GFR calc Af Amer: 31 mL/min — ABNORMAL LOW (ref >90)
GFR calc non Af Amer: 27 mL/min — ABNORMAL LOW (ref >90)
Glucose, Bld: 106 mg/dL — ABNORMAL HIGH (ref 70–99)
Potassium: 5.8 mEq/L — ABNORMAL HIGH (ref 3.5–5.1)
Sodium: 131 mEq/L — ABNORMAL LOW (ref 135–145)
Total Bilirubin: 0.6 mg/dL (ref 0.3–1.2)
Total Protein: 7.4 g/dL (ref 6.0–8.3)

## 2011-04-15 LAB — URINALYSIS, ROUTINE W REFLEX MICROSCOPIC
Bilirubin Urine: NEGATIVE
Glucose, UA: NEGATIVE mg/dL
Hgb urine dipstick: NEGATIVE
Ketones, ur: NEGATIVE mg/dL
Leukocytes, UA: NEGATIVE
Nitrite: NEGATIVE
Protein, ur: NEGATIVE mg/dL
Specific Gravity, Urine: 1.011 (ref 1.005–1.030)
Urobilinogen, UA: 0.2 mg/dL (ref 0.0–1.0)
pH: 5.5 (ref 5.0–8.0)

## 2011-04-15 LAB — DIFFERENTIAL
Basophils Absolute: 0 10*3/uL (ref 0.0–0.1)
Basophils Relative: 0 % (ref 0–1)
Eosinophils Absolute: 0 10*3/uL (ref 0.0–0.7)
Eosinophils Relative: 0 % (ref 0–5)
Lymphocytes Relative: 4 % — ABNORMAL LOW (ref 12–46)
Lymphs Abs: 0.8 10*3/uL (ref 0.7–4.0)
Monocytes Absolute: 1.8 10*3/uL — ABNORMAL HIGH (ref 0.1–1.0)
Monocytes Relative: 9 % (ref 3–12)
Neutro Abs: 17.4 10*3/uL — ABNORMAL HIGH (ref 1.7–7.7)
Neutrophils Relative %: 87 % — ABNORMAL HIGH (ref 43–77)

## 2011-04-15 LAB — PROTIME-INR
INR: 1.15 (ref 0.00–1.49)
Prothrombin Time: 14.9 seconds (ref 11.6–15.2)

## 2011-04-15 LAB — APTT: aPTT: 24 seconds (ref 24–37)

## 2011-04-15 SURGERY — REMOVAL, HARDWARE
Anesthesia: General | Laterality: Left

## 2011-04-15 MED ORDER — POLYETHYLENE GLYCOL 3350 17 G PO PACK
17.0000 g | PACK | Freq: Every day | ORAL | Status: DC | PRN
  Filled 2011-04-15: qty 1

## 2011-04-15 MED ORDER — LISINOPRIL 10 MG PO TABS
10.0000 mg | ORAL_TABLET | Freq: Every day | ORAL | Status: DC
  Administered 2011-04-16 – 2011-04-24 (×7): 10 mg via ORAL
  Filled 2011-04-15 (×10): qty 1

## 2011-04-15 MED ORDER — TRAMADOL HCL 50 MG PO TABS
50.0000 mg | ORAL_TABLET | ORAL | Status: DC | PRN
  Administered 2011-04-16 – 2011-04-27 (×5): 50 mg via ORAL
  Filled 2011-04-15 (×5): qty 1

## 2011-04-15 MED ORDER — VANCOMYCIN HCL IN DEXTROSE 1-5 GM/200ML-% IV SOLN
1000.0000 mg | Freq: Once | INTRAVENOUS | Status: AC
  Administered 2011-04-15: 1000 mg via INTRAVENOUS
  Filled 2011-04-15: qty 200

## 2011-04-15 MED ORDER — ACETAMINOPHEN 325 MG PO TABS
650.0000 mg | ORAL_TABLET | Freq: Four times a day (QID) | ORAL | Status: DC | PRN
  Administered 2011-04-25: 650 mg via ORAL
  Filled 2011-04-15: qty 2

## 2011-04-15 MED ORDER — PATIENT'S GUIDE TO USING COUMADIN BOOK
Freq: Once | Status: AC
  Administered 2011-04-15: 19:00:00
  Filled 2011-04-15: qty 1

## 2011-04-15 MED ORDER — ATORVASTATIN CALCIUM 40 MG PO TABS
40.0000 mg | ORAL_TABLET | Freq: Every day | ORAL | Status: DC
  Administered 2011-04-16 – 2011-04-27 (×12): 40 mg via ORAL
  Filled 2011-04-15 (×13): qty 1

## 2011-04-15 MED ORDER — ONDANSETRON HCL 4 MG/2ML IJ SOLN
4.0000 mg | Freq: Four times a day (QID) | INTRAMUSCULAR | Status: DC | PRN
  Administered 2011-04-15 – 2011-04-25 (×6): 4 mg via INTRAVENOUS
  Filled 2011-04-15 (×5): qty 2

## 2011-04-15 MED ORDER — BISACODYL 10 MG RE SUPP
10.0000 mg | Freq: Every day | RECTAL | Status: DC | PRN
  Administered 2011-04-21: 10 mg via RECTAL
  Filled 2011-04-15: qty 1

## 2011-04-15 MED ORDER — ACETAMINOPHEN 650 MG RE SUPP
650.0000 mg | Freq: Four times a day (QID) | RECTAL | Status: DC | PRN
  Administered 2011-04-21: 650 mg via RECTAL

## 2011-04-15 MED ORDER — FLEET ENEMA 7-19 GM/118ML RE ENEM: 1.0000 | ENEMA | Freq: Once | RECTAL | Status: AC | PRN

## 2011-04-15 MED ORDER — WARFARIN SODIUM 7.5 MG PO TABS
7.5000 mg | ORAL_TABLET | Freq: Once | ORAL | Status: AC
  Administered 2011-04-15: 7.5 mg via ORAL
  Filled 2011-04-15: qty 1

## 2011-04-15 MED ORDER — ONDANSETRON HCL 4 MG PO TABS: 4.0000 mg | ORAL_TABLET | Freq: Four times a day (QID) | ORAL | Status: DC | PRN

## 2011-04-15 MED ORDER — SODIUM CHLORIDE 0.9 % IV BOLUS (SEPSIS)
1000.0000 mL | Freq: Once | INTRAVENOUS | Status: DC
  Administered 2011-04-15: 1000 mL via INTRAVENOUS

## 2011-04-15 MED ORDER — OXYCODONE-ACETAMINOPHEN 5-325 MG PO TABS
1.0000 | ORAL_TABLET | Freq: Four times a day (QID) | ORAL | Status: DC | PRN
  Administered 2011-04-16: 1 via ORAL
  Filled 2011-04-15: qty 1

## 2011-04-15 MED ORDER — MORPHINE SULFATE 4 MG/ML IJ SOLN
8.0000 mg | Freq: Once | INTRAMUSCULAR | Status: AC
  Administered 2011-04-15: 8 mg via INTRAVENOUS
  Filled 2011-04-15: qty 2

## 2011-04-15 MED ORDER — ALPRAZOLAM 0.25 MG PO TABS
0.2500 mg | ORAL_TABLET | Freq: Every day | ORAL | Status: DC
Start: 2011-04-15 — End: 2011-04-28
  Administered 2011-04-15 – 2011-04-28 (×14): 0.25 mg via ORAL
  Filled 2011-04-15 (×15): qty 1

## 2011-04-15 MED ORDER — ISOSORBIDE MONONITRATE ER 60 MG PO TB24
60.0000 mg | ORAL_TABLET | Freq: Every day | ORAL | Status: DC
  Administered 2011-04-16 – 2011-04-28 (×13): 60 mg via ORAL
  Filled 2011-04-15 (×13): qty 1

## 2011-04-15 MED ORDER — WARFARIN VIDEO: Freq: Once | Status: DC

## 2011-04-15 MED ORDER — PIPERACILLIN-TAZOBACTAM 3.375 G IVPB
3.3750 g | Freq: Once | INTRAVENOUS | Status: AC
  Administered 2011-04-15: 3.375 g via INTRAVENOUS
  Filled 2011-04-15: qty 50

## 2011-04-15 MED ORDER — WARFARIN - PHARMACIST DOSING INPATIENT: Freq: Every day | Status: DC

## 2011-04-15 MED ORDER — ASPIRIN 81 MG PO CHEW
81.0000 mg | CHEWABLE_TABLET | Freq: Every day | ORAL | Status: DC
  Filled 2011-04-15 (×2): qty 1

## 2011-04-15 NOTE — Progress Notes (Signed)
Subjective: Cellulitis of Left Leg and early Renal Failure.   Objective: Vital signs in last 24 hours: Temp:  [98.1 F (36.7 C)-98.6 F (37 C)] 98.6 F (37 C) (03/26 1540) Pulse Rate:  [74-84] 74  (03/26 1540) Resp:  [18-24] 18  (03/26 1540) BP: (133-145)/(58-74) 145/74 mmHg (03/26 1540) SpO2:  [96 %-97 %] 96 % (03/26 1540) Weight:  [119 kg (262 lb 5.6 oz)] 119 kg (262 lb 5.6 oz) (03/26 1540)  Intake/Output from previous day:   Intake/Output this shift:     Basename 04/15/11 0945  HGB 13.7    Basename 04/15/11 0945  WBC 20.0*  RBC 4.56  HCT 40.3  PLT 256    Basename 04/15/11 0945  NA 131*  K 5.8*  CL 97  CO2 18*  BUN 71*  CREATININE 2.29*  GLUCOSE 106*  CALCIUM 9.6    Basename 04/15/11 0945  LABPT --  INR 1.15    Neurologically intact Dorsiflexion/Plantar flexion intact  Assessment/Plan: Will have Dr. Jacky Kindle evaluate his overall medical Condition,with special attention on his Renal Function.[place] Blood and Urine Cultures.   Joe [name] A 04/15/2011, 5:25 PM

## 2011-04-15 NOTE — ED Provider Notes (Addendum)
Medical screening examination/treatment/procedure(s) were conducted as a shared visit with non-physician practitioner(s) and myself.  I personally evaluated the patient during the encounter  The patient has had recent orthopedic manipulation of his left lower extremity.  He now presents with a large cellulitis to his left lower extremity.  He has normal pulses in his left foot.  Antibiotics and blood cultures have been initiated.  I spoke with Dr. Jeannetta Ellis nurse reports that a mid-level provider will be evaluating the patient in the ER for admission.  The patient's pain was controlled.  His had no elevation in his BUN and creatinine however he was n.p.o. today.  I suspect this is prerenal in nature have recommended that if this does not improve after IV fluids that his primary care doctor be consulted by the orthopedics team  Lyanne Co, MD 04/15/11 1514  Lyanne Co, MD 04/15/11 [phone]

## 2011-04-15 NOTE — Pre-Procedure Instructions (Signed)
Pt arrived to Short Stay Dept per ambulance for pre-surgical testing appt.  Pt states "I've been sick for 3-4 days" .with body aches, fever, chills, nausea, vomiting, difficulty urinating. Also has swelling, warmth, reddness to left lower leg with gauze bandage over small puncture lateral left knee area. Dr. Darrelyn Hillock notified per phone, and requested pt be evaluated in Emergency dept. Pt was transported via stretcher  to ED, report given to triage nurse.

## 2011-04-15 NOTE — Progress Notes (Signed)
VASCULAR LAB PRELIMINARY  PRELIMINARY  PRELIMINARY  PRELIMINARY  Left lower extremity venous duplex completed.    Preliminary report:  Left:  No evidence of DVT, superficial thrombosis, or Baker's cyst.  Artice Holohan D, RVS 04/15/2011, 2:09 PM

## 2011-04-15 NOTE — H&P (Signed)
Joe [name] is an 76 y.o. male.   Chief Complaint: left leg pain HPI: The patient is a 76 year old male who presented to the hospital today for what he thought was a surgical procedure to remove a loose femoral pin from his left total knee revision. In reality, he was scheduled for preoperative labs and workup today, with his surgery scheduled for next Wednesday April 23, 2011. Upon reaching the hospital via ambulance, he reported feeling sick the last 3-4 days. He reports that since early over the weekend he has had a fever, nausea, vomiting, urinary urgency and frequency, abdominal pain and tenderness, generalized body aches, and left leg pain. He states that he has been unable to keep anything down and is going to the restroom very often. He reports that shortly thereafter he began to notice that his left lower extremity was becoming red and swollen. He denies any drainage from a surgical wound that he has on his leg. Dr. Darrelyn Hillock attempted to remove the femoral pin in the office under sterile procedure as on x-ray the femoral pin appeared to be very superficial. He was unable to do so and therefore scheduled the patient for surgery. He reports that at the present time his left leg has a throbbing pain in it constantly and he has increased sharp pain in the leg when he moves it, specifically at the left knee. He denies chest pain, shortness of breath, dysuira, and dizziness at the present time.   Past Medical History  Diagnosis Date  . Hypertension   . Hypercholesteremia   . Anxiety     Past Surgical History  Procedure Date  . Coronary artery bypass graft   . Joint replacement   . Foot surgery   . Leg surgery   . Appendectomy    Family History Heart disease: maternal grandmother Hypertension: maternal grandmother  Social History:  reports that he has never smoked. He has never used smokeless tobacco. He reports that he does not drink alcohol or use illicit drugs.  Allergies:  Allergies    Allergen Reactions  . Biaxin Rash    Medications Prior to Admission  Medication Dose Route Frequency Provider Last Rate Last Dose  . morphine 4 MG/ML injection 8 mg  8 mg Intravenous Once Lyanne Co, MD      . piperacillin-tazobactam (ZOSYN) IVPB 3.375 g  3.375 g Intravenous Once Fayrene Helper, PA-C      . sodium chloride 0.9 % bolus 1,000 mL  1,000 mL Intravenous Once Lyanne Co, MD      . vancomycin (VANCOCIN) IVPB 1000 mg/200 mL premix  1,000 mg Intravenous Once Fayrene Helper, PA-C       No current outpatient prescriptions on file as of 04/15/2011.   Home Medications Xanax 0.25mg  daily Atorvastatin 40mg  daily Isosorbide Mononitrate ER 60mg  daily Lisinopril 10mg  daily  Meloxicam 15mg  daily Tramadol 50mg  daily  Results for orders placed during the hospital encounter of 04/15/11 (from the past 48 hour(s))  APTT     Status: Normal   Collection Time   04/15/11  9:45 AM      Component Value Range Comment   aPTT 24  24 - 37 (seconds)   CBC     Status: Abnormal   Collection Time   04/15/11  9:45 AM      Component Value Range Comment   WBC 20.0 (*) 4.0 - 10.5 (K/uL)    RBC 4.56  4.22 - 5.81 (MIL/uL)    Hemoglobin  13.7  13.0 - 17.0 (g/dL)    HCT 40.9  81.1 - 91.4 (%)    MCV 88.4  78.0 - 100.0 (fL)    MCH 30.0  26.0 - 34.0 (pg)    MCHC 34.0  30.0 - 36.0 (g/dL)    RDW 78.2  95.6 - 21.3 (%)    Platelets 256  150 - 400 (K/uL)   COMPREHENSIVE METABOLIC PANEL     Status: Abnormal   Collection Time   04/15/11  9:45 AM      Component Value Range Comment   Sodium 131 (*) 135 - 145 (mEq/L)    Potassium 5.8 (*) 3.5 - 5.1 (mEq/L)    Chloride 97  96 - 112 (mEq/L)    CO2 18 (*) 19 - 32 (mEq/L)    Glucose, Bld 106 (*) 70 - 99 (mg/dL)    BUN 71 (*) 6 - 23 (mg/dL)    Creatinine, Ser 0.86 (*) 0.50 - 1.35 (mg/dL)    Calcium 9.6  8.4 - 10.5 (mg/dL)    Total Protein 7.4  6.0 - 8.3 (g/dL)    Albumin 2.7 (*) 3.5 - 5.2 (g/dL)    AST 75 (*) 0 - 37 (U/L)    ALT 73 (*) 0 - 53 (U/L)    Alkaline  Phosphatase 208 (*) 39 - 117 (U/L)    Total Bilirubin 0.6  0.3 - 1.2 (mg/dL)    GFR calc non Af Amer 27 (*) >90 (mL/min)    GFR calc Af Amer 31 (*) >90 (mL/min)   DIFFERENTIAL     Status: Abnormal   Collection Time   04/15/11  9:45 AM      Component Value Range Comment   Neutrophils Relative 87 (*) 43 - 77 (%)    Lymphocytes Relative 4 (*) 12 - 46 (%)    Monocytes Relative 9  3 - 12 (%)    Eosinophils Relative 0  0 - 5 (%)    Basophils Relative 0  0 - 1 (%)    Neutro Abs 17.4 (*) 1.7 - 7.7 (K/uL)    Lymphs Abs 0.8  0.7 - 4.0 (K/uL)    Monocytes Absolute 1.8 (*) 0.1 - 1.0 (K/uL)    Eosinophils Absolute 0.0  0.0 - 0.7 (K/uL)    Basophils Absolute 0.0  0.0 - 0.1 (K/uL)    WBC Morphology TOXIC GRANULATION     PROTIME-INR     Status: Normal   Collection Time   04/15/11  9:45 AM      Component Value Range Comment   Prothrombin Time 14.9  11.6 - 15.2 (seconds)    INR 1.15  0.00 - 1.49    URINALYSIS, ROUTINE W REFLEX MICROSCOPIC     Status: Normal   Collection Time   04/15/11  9:55 AM      Component Value Range Comment   Color, Urine YELLOW  YELLOW     APPearance CLEAR  CLEAR     Specific Gravity, Urine 1.011  1.005 - 1.030     pH 5.5  5.0 - 8.0     Glucose, UA NEGATIVE  NEGATIVE (mg/dL)    Hgb urine dipstick NEGATIVE  NEGATIVE     Bilirubin Urine NEGATIVE  NEGATIVE     Ketones, ur NEGATIVE  NEGATIVE (mg/dL)    Protein, ur NEGATIVE  NEGATIVE (mg/dL)    Urobilinogen, UA 0.2  0.0 - 1.0 (mg/dL)    Nitrite NEGATIVE  NEGATIVE     Leukocytes, UA NEGATIVE  NEGATIVE  MICROSCOPIC NOT DONE ON URINES WITH NEGATIVE PROTEIN, BLOOD, LEUKOCYTES, NITRITE, OR GLUCOSE <1000 mg/dL.    Review of Systems  Constitutional: Positive for fever, chills, malaise/fatigue and diaphoresis.  HENT: Negative for congestion, sore throat, neck pain and tinnitus.   Eyes: Negative for blurred vision, double vision and photophobia.  Respiratory: Negative for cough, hemoptysis, sputum production, shortness of breath  and wheezing.   Cardiovascular: Positive for leg swelling. Negative for chest pain and palpitations.  Gastrointestinal: Positive for nausea, vomiting and abdominal pain. Negative for heartburn, diarrhea, constipation and blood in stool.  Genitourinary: Positive for urgency and frequency. Negative for dysuria, hematuria and flank pain.  Musculoskeletal: Positive for myalgias and joint pain. Negative for back pain.  Skin: Negative for rash.  Neurological: Negative for dizziness, tingling, tremors, sensory change, seizures, loss of consciousness and headaches.  Endo/Heme/Allergies: Negative for environmental allergies and polydipsia. Does not bruise/bleed easily.  Psychiatric/Behavioral: Negative for depression. The patient is not nervous/anxious and does not have insomnia.     Blood pressure 133/58, pulse 84, temperature 98.1 F (36.7 C), temperature source Oral, resp. rate 24, SpO2 97.00%. Physical Exam  Constitutional: He appears well-developed and well-nourished. He appears lethargic. He has a sickly appearance.  HENT:  Head: Normocephalic.  Right Ear: External ear normal.  Left Ear: External ear normal.  Nose: Nose normal.  Mouth/Throat: Oropharynx is clear and moist.  Eyes: Conjunctivae and EOM are normal. Pupils are equal, round, and reactive to light.  Neck: Normal range of motion. Neck supple. No tracheal deviation present.  Cardiovascular: An irregularly irregular rhythm present. Exam reveals no friction rub.   No murmur heard. Pulses:      Radial pulses are 2+ on the right side, and 2+ on the left side.       Dorsalis pedis pulses are 2+ on the right side, and 1+ on the left side.       Posterior tibial pulses are 2+ on the right side, and 1+ on the left side.  Respiratory: Effort normal and breath sounds normal. No respiratory distress. He has no wheezes.  GI: Soft. Bowel sounds are normal. He exhibits no distension and no mass. There is tenderness. There is no rebound and no  guarding.  Musculoskeletal:       Right knee: He exhibits normal range of motion, no swelling and no effusion. no tenderness found.       Left knee: He exhibits decreased range of motion. He exhibits no swelling and no effusion. tenderness found.       Right lower leg: He exhibits no tenderness and no swelling.       Left lower leg: He exhibits tenderness and swelling.       Legs:      Right foot: He exhibits normal range of motion, no tenderness, no swelling and normal capillary refill.       Left foot: He exhibits decreased range of motion, tenderness and swelling. He exhibits normal capillary refill.  Lymphadenopathy:    He has no cervical adenopathy.  Neurological: He has normal strength. He appears lethargic. He displays normal reflexes. No cranial nerve deficit or sensory deficit.  Skin: Skin is warm. No rash noted. He is diaphoretic. There is erythema.     Psychiatric: His speech is normal. His mood appears anxious. He is agitated.     Assessment/Plan With the constitutional symptoms combined with the elevated white count with left shift it appears that he has some sort of infective process  going on. At the present time, the surgical wound and the left knee do not appear to be the source but it is possible. CXR and EKG are ordered. UA unremarkable. Blood cultures ordered. Will also get doppler of the left calf to rule out DVT. Patient placed on IV fluids to help improve creatinine. Will consult with his medical doctor and have hospitalist follow him as well while admitted.   Demarlo Riojas LAUREN 04/15/2011, 12:21 PM

## 2011-04-15 NOTE — ED Notes (Signed)
Doppler at bedside performing LLE doppler.

## 2011-04-15 NOTE — Progress Notes (Signed)
ANTICOAGULATION CONSULT NOTE - Initial Consult  Pharmacy Consult for Coumadin Indication: atrial fibrillation  Allergies  Allergen Reactions  . Biaxin Rash    Patient Measurements: Height: 5\' 10"  (177.8 cm) Weight: 262 lb 5.6 oz (119 kg) IBW/kg (Calculated) : 73    Vital Signs: Temp: 98.6 F (37 C) (03/26 1540) Temp src: Oral (03/26 1540) BP: 145/74 mmHg (03/26 1540) Pulse Rate: 74  (03/26 1540)  Labs:  Basename 04/15/11 0945  HGB 13.7  HCT 40.3  PLT 256  APTT 24  LABPROT 14.9  INR 1.15  HEPARINUNFRC --  CREATININE 2.29*  CKTOTAL --  CKMB --  TROPONINI --   Estimated Creatinine Clearance: 37.1 ml/min (by C-G formula based on Cr of 2.29).  Medical History: Past Medical History  Diagnosis Date  . Hypertension   . Hypercholesteremia   . Anxiety     Medications:  Scheduled:    . ALPRAZolam  0.25 mg Oral Daily  . atorvastatin  40 mg Oral Daily  . isosorbide mononitrate  60 mg Oral Daily  . lisinopril  10 mg Oral Daily  .  morphine injection  8 mg Intravenous Once  . piperacillin-tazobactam (ZOSYN)  IV  3.375 g Intravenous Once  . vancomycin  1,000 mg Intravenous Once  . DISCONTD: aspirin  81 mg Oral Daily  . DISCONTD: sodium chloride  1,000 mL Intravenous Once   Infusions:    Assessment:  76 year old male with cellulitis of left leg & early renal failure  Patient noted with remote history of coumadin  Coumadin to begin for afib per Dr Darrelyn Hillock.  Plans for Dr Jacky Kindle to evaluate overall medical condition.    Goal of Therapy:  INR 2-3   Plan:  1.  Coumadin 7.5 mg po x 1 tonight 2.  Check daily PT/INR 3.  Coumadin book/video  Maryellen Pile 04/15/2011,6:05 PM

## 2011-04-15 NOTE — ED Provider Notes (Signed)
History     CSN: [id number]  Arrival date & time 04/15/11  1003   First MD Initiated Contact with Patient 04/15/11 1030      Chief Complaint  Patient presents with  . Leg Swelling  . Cellulitis    (Consider location/radiation/quality/duration/timing/severity/associated sxs/prior treatment) HPI  76 year old male with history of left knee replacement with prosthetic femoral and tibial components who has recently has called the patient's with his prosthesis when his pins were coming loose.  He was scheduled to have surgical procedure by Dr. Darrelyn Hillock today to have his pins removed when he was found to have what appears to be cellulitis of his left leg. Therefore, Dr. Tinnie Gens wanted patient to be admitted for further management. Patient states for the past 3-4 days he has been experiencing increasing pain to his left leg along with redness and swelling. Described pain as a throbbing sensation, that is persistent and worsening with movement.  He also has been experiencing subjective fever but no chills. He denies headache, chest pain, shortness of breath, abdominal pain, nausea, vomiting, diarrhea. States he has had some trouble initiating his urinary stream but denies burning.     Past Medical History  Diagnosis Date  . Hypertension   . Hypercholesteremia   . Anxiety     Past Surgical History  Procedure Date  . Coronary artery bypass graft   . Joint replacement   . Foot surgery   . Leg surgery   . Appendectomy     No family history on file.  History  Substance Use Topics  . Smoking status: Never Smoker   . Smokeless tobacco: Not on file  . Alcohol Use: No      Review of Systems  All other systems reviewed and are negative.    Allergies  Biaxin  Home Medications  No current outpatient prescriptions on file.  BP 133/58  Pulse 84  Temp(Src) 98.1 F (36.7 C) (Oral)  Resp 24  SpO2 97%  Physical Exam  Nursing note and vitals reviewed. Constitutional: He appears  well-developed and well-nourished. No distress.  HENT:  Head: Normocephalic and atraumatic.       Facial psoriasis noted   Eyes: Conjunctivae are normal.  Cardiovascular: An irregularly irregular rhythm present. Exam reveals no gallop and no friction rub.   No murmur heard. Pulmonary/Chest: Effort normal and breath sounds normal.  Abdominal: Soft. There is no tenderness.  Musculoskeletal:       Moderate erythema, and swelling noted to left lower extremities. Well-healing surgical scar to the proximal anterior tibia. An open ulceration noted to the lateral aspects of the surgical scar with dressing covered. Tenderness throughout lower leg. Brisk cap refills.  Neurological: He is alert.  Skin: Skin is warm.  Psychiatric: His behavior is normal. Judgment normal. Cognition and memory are impaired. He exhibits abnormal recent memory.    ED Course  Procedures (including critical care time)  Labs Reviewed - No data to display No results found.   No diagnosis found.  Results for orders placed during the hospital encounter of 04/15/11  APTT      Component Value Range   aPTT 24  24 - 37 (seconds)  CBC      Component Value Range   WBC 20.0 (*) 4.0 - 10.5 (K/uL)   RBC 4.56  4.22 - 5.81 (MIL/uL)   Hemoglobin 13.7  13.0 - 17.0 (g/dL)   HCT 40.9  81.1 - 91.4 (%)   MCV 88.4  78.0 - 100.0 (fL)  MCH 30.0  26.0 - 34.0 (pg)   MCHC 34.0  30.0 - 36.0 (g/dL)   RDW 62.9  52.8 - 41.3 (%)   Platelets 256  150 - 400 (K/uL)  COMPREHENSIVE METABOLIC PANEL      Component Value Range   Sodium 131 (*) 135 - 145 (mEq/L)   Potassium 5.8 (*) 3.5 - 5.1 (mEq/L)   Chloride 97  96 - 112 (mEq/L)   CO2 18 (*) 19 - 32 (mEq/L)   Glucose, Bld 106 (*) 70 - 99 (mg/dL)   BUN 71 (*) 6 - 23 (mg/dL)   Creatinine, Ser 2.44 (*) 0.50 - 1.35 (mg/dL)   Calcium 9.6  8.4 - 01.0 (mg/dL)   Total Protein 7.4  6.0 - 8.3 (g/dL)   Albumin 2.7 (*) 3.5 - 5.2 (g/dL)   AST 75 (*) 0 - 37 (U/L)   ALT 73 (*) 0 - 53 (U/L)    Alkaline Phosphatase 208 (*) 39 - 117 (U/L)   Total Bilirubin 0.6  0.3 - 1.2 (mg/dL)   GFR calc non Af Amer 27 (*) >90 (mL/min)   GFR calc Af Amer 31 (*) >90 (mL/min)  DIFFERENTIAL      Component Value Range   Neutrophils Relative 87 (*) 43 - 77 (%)   Lymphocytes Relative 4 (*) 12 - 46 (%)   Monocytes Relative 9  3 - 12 (%)   Eosinophils Relative 0  0 - 5 (%)   Basophils Relative 0  0 - 1 (%)   Neutro Abs 17.4 (*) 1.7 - 7.7 (K/uL)   Lymphs Abs 0.8  0.7 - 4.0 (K/uL)   Monocytes Absolute 1.8 (*) 0.1 - 1.0 (K/uL)   Eosinophils Absolute 0.0  0.0 - 0.7 (K/uL)   Basophils Absolute 0.0  0.0 - 0.1 (K/uL)   WBC Morphology TOXIC GRANULATION    PROTIME-INR      Component Value Range   Prothrombin Time 14.9  11.6 - 15.2 (seconds)   INR 1.15  0.00 - 1.49   URINALYSIS, ROUTINE W REFLEX MICROSCOPIC      Component Value Range   Color, Urine YELLOW  YELLOW    APPearance CLEAR  CLEAR    Specific Gravity, Urine 1.011  1.005 - 1.030    pH 5.5  5.0 - 8.0    Glucose, UA NEGATIVE  NEGATIVE (mg/dL)   Hgb urine dipstick NEGATIVE  NEGATIVE    Bilirubin Urine NEGATIVE  NEGATIVE    Ketones, ur NEGATIVE  NEGATIVE (mg/dL)   Protein, ur NEGATIVE  NEGATIVE (mg/dL)   Urobilinogen, UA 0.2  0.0 - 1.0 (mg/dL)   Nitrite NEGATIVE  NEGATIVE    Leukocytes, UA NEGATIVE  NEGATIVE    No results found.   Date: 04/15/2011  Rate: 82  Rhythm: atrial fibrillation  QRS Axis: normal  Intervals: afib  ST/T Wave abnormalities: nonspecific T wave changes  Conduction Disutrbances:none  Narrative Interpretation:   Old EKG Reviewed: unchanged    MDM  Apparent cellulitis to left lower extremities. Lab was obtained a short stay. Is significant for a white count of 20, with a left shift. Sodium of 131, potassium of 5.8, CO2 15, BUN of 71, and creatinine of 2.29. UA shows no signs of infection.  It appears to be an infective process, questionable secondary to his prosthesis. I discussed with my attending. 2 sets of blood  cultures ordered. Patient will be started on vancomycin and Zosyn.  My attending has consulted with Dr. Thomas Hoff nurse who acknowledge that they  are aware of the patient and Dr. Thomas Hoff PA will be seeing pt in ED.  Pt is to be rehydrates with IVF due to increase BUN/Cr, likely prerenal.  Pain medication given.  Will facilitates admission.        Fayrene Helper, PA-C 04/15/11 1155

## 2011-04-15 NOTE — ED Notes (Signed)
Only able to obtain blue bottle on one set of BC. Pt stuck multiple times for blood samples. Greta Doom, EDPA made aware. Sts he will consult with EDP concerning this and need for 2nd set of cultures. Will postpone starting abx until this is obtained/cancelled.

## 2011-04-15 NOTE — ED Notes (Signed)
Pt was sent to [facility] for testing prior to surgery, came in to [facility] by ambulance, d/t no beds in [facility], pt was taken to Short Stay, per Agustin Cree, RN reporting "Dr. Darrelyn Hillock wanted pt admitted, labs already performed are CBC, CMET, PT, PTT & some urine was collected & hopefully is sufficient, pin was to be removed from his left leg, appears he has cellulitis to the left leg, have been looking for his wife for over an hour, she was supposed to be following the ambulance, he is in the back hallway on a stretcher."

## 2011-04-15 NOTE — ED Notes (Signed)
Pt reports swelling, redness and pain to LLE for a few days. The hardware placed in that leg has been "working itself out." Was supposed to have hardware removed today by dr. Netta Corrigan but sent here for cellulitis eval.

## 2011-04-15 NOTE — ED Notes (Signed)
Per [place], EDP okay to cancel one set of BC due to being unable to obtain blood. Antibiotics started after first and only set of cultures drawn.

## 2011-04-16 LAB — COMPREHENSIVE METABOLIC PANEL
ALT: 58 U/L — ABNORMAL HIGH (ref 0–53)
AST: 60 U/L — ABNORMAL HIGH (ref 0–37)
Albumin: 2.2 g/dL — ABNORMAL LOW (ref 3.5–5.2)
Alkaline Phosphatase: 178 U/L — ABNORMAL HIGH (ref 39–117)
BUN: 60 mg/dL — ABNORMAL HIGH (ref 6–23)
CO2: 19 mEq/L (ref 19–32)
Calcium: 9.1 mg/dL (ref 8.4–10.5)
Chloride: 98 mEq/L (ref 96–112)
Creatinine, Ser: 1.97 mg/dL — ABNORMAL HIGH (ref 0.50–1.35)
GFR calc Af Amer: 37 mL/min — ABNORMAL LOW (ref >90)
GFR calc non Af Amer: 32 mL/min — ABNORMAL LOW (ref >90)
Glucose, Bld: 103 mg/dL — ABNORMAL HIGH (ref 70–99)
Potassium: 5.6 mEq/L — ABNORMAL HIGH (ref 3.5–5.1)
Sodium: 130 mEq/L — ABNORMAL LOW (ref 135–145)
Total Bilirubin: 0.6 mg/dL (ref 0.3–1.2)
Total Protein: 6.7 g/dL (ref 6.0–8.3)

## 2011-04-16 LAB — CBC
HCT: 38.2 % — ABNORMAL LOW (ref 39.0–52.0)
Hemoglobin: 13.1 g/dL (ref 13.0–17.0)
MCH: 30.3 pg (ref 26.0–34.0)
MCHC: 34.3 g/dL (ref 30.0–36.0)
MCV: 88.2 fL (ref 78.0–100.0)
Platelets: 279 10*3/uL (ref 150–400)
RBC: 4.33 MIL/uL (ref 4.22–5.81)
RDW: 14.5 % (ref 11.5–15.5)
WBC: 21.7 10*3/uL — ABNORMAL HIGH (ref 4.0–10.5)

## 2011-04-16 LAB — CARDIAC PANEL(CRET KIN+CKTOT+MB+TROPI)
Relative Index: INVALID (ref 0.0–2.5)
Troponin I: <0.3 ng/mL (ref <0.30)

## 2011-04-16 LAB — PROTIME-INR
INR: 1.17 (ref 0.00–1.49)
Prothrombin Time: 15.1 seconds (ref 11.6–15.2)

## 2011-04-16 LAB — PHOSPHORUS: Phosphorus: 3.9 mg/dL (ref 2.3–4.6)

## 2011-04-16 MED ORDER — LACTATED RINGERS IV SOLN: INTRAVENOUS | Status: DC

## 2011-04-16 MED ORDER — ENOXAPARIN SODIUM 30 MG/0.3ML ~~LOC~~ SOLN
30.0000 mg | SUBCUTANEOUS | Status: AC
  Administered 2011-04-16 – 2011-04-19 (×4): 30 mg via SUBCUTANEOUS
  Filled 2011-04-16 (×4): qty 0.3

## 2011-04-16 MED ORDER — ASPIRIN 325 MG PO TABS: 325.0000 mg | ORAL_TABLET | Freq: Two times a day (BID) | ORAL | Status: DC

## 2011-04-16 MED ORDER — CEFAZOLIN SODIUM-DEXTROSE 2-3 GM-% IV SOLR: 2.0000 g | INTRAVENOUS | Status: DC

## 2011-04-16 MED ORDER — ALPRAZOLAM 0.25 MG PO TABS
0.2500 mg | ORAL_TABLET | Freq: Three times a day (TID) | ORAL | Status: DC | PRN
  Administered 2011-04-17 – 2011-04-24 (×2): 0.25 mg via ORAL
  Filled 2011-04-16: qty 1

## 2011-04-16 MED ORDER — OXYCODONE-ACETAMINOPHEN 5-325 MG PO TABS
1.0000 | ORAL_TABLET | Freq: Four times a day (QID) | ORAL | Status: DC | PRN
  Administered 2011-04-16 – 2011-04-20 (×6): 2 via ORAL
  Administered 2011-04-21: 1 via ORAL
  Administered 2011-04-24 – 2011-04-28 (×11): 2 via ORAL
  Filled 2011-04-16 (×3): qty 2
  Filled 2011-04-16: qty 1
  Filled 2011-04-16 (×10): qty 2
  Filled 2011-04-16: qty 1
  Filled 2011-04-16 (×4): qty 2

## 2011-04-16 MED ORDER — CEFAZOLIN SODIUM 1-5 GM-% IV SOLN
1.0000 g | Freq: Three times a day (TID) | INTRAVENOUS | Status: AC
  Administered 2011-04-16 – 2011-04-17 (×5): 1 g via INTRAVENOUS
  Filled 2011-04-16 (×7): qty 50

## 2011-04-16 MED ORDER — SODIUM CHLORIDE 0.9 % IV SOLN
INTRAVENOUS | Status: DC
  Administered 2011-04-16 – 2011-04-25 (×15): via INTRAVENOUS

## 2011-04-16 MED ORDER — OXYCODONE-ACETAMINOPHEN 5-325 MG PO TABS: 1.0000 | ORAL_TABLET | Freq: Four times a day (QID) | ORAL | Status: AC | PRN

## 2011-04-16 NOTE — Progress Notes (Signed)
Dragon server down--full note to follow. Patient seen and examined  1. afib--monitor, enzymes, lovenox--hemodynamically stable, no chest pain  2. Acute renal failure-hydrate  3. I.d--cultures pending, cellulitis, good abx coverage  4. Cad-stable

## 2011-04-16 NOTE — Progress Notes (Signed)
Subjective: Cellulitis is much better in left leg. Coumadin was restarted until Dr. Jacky Kindle decides otherwise. His Renal function studies need to be followed by Dr. Jacky Kindle.Xrays show that the pin is now absent from his left knee.Will DC Vancomycin since his renal function is abnormal.Will start Ancef.   Objective: Vital signs in last 24 hours: Temp:  [98.1 F (36.7 C)-98.8 F (37.1 C)] 98.4 F (36.9 C) (03/27 0524) Pulse Rate:  [74-108] 108  (03/27 0524) Resp:  [18-24] 18  (03/27 0524) BP: (127-150)/(58-82) 127/81 mmHg (03/27 0524) SpO2:  [95 %-98 %] 98 % (03/27 0524) Weight:  [119 kg (262 lb 5.6 oz)] 119 kg (262 lb 5.6 oz) (03/26 1540)  Intake/Output from previous day: 03/26 0701 - 03/27 0700 In: -  Out: 600 [Urine:600] Intake/Output this shift: Total I/O In: -  Out: 600 [Urine:600]   Basename 04/15/11 0945  HGB 13.7    Basename 04/15/11 0945  WBC 20.0*  RBC 4.56  HCT 40.3  PLT 256    Basename 04/15/11 0945  NA 131*  K 5.8*  CL 97  CO2 18*  BUN 71*  CREATININE 2.29*  GLUCOSE 106*  CALCIUM 9.6    Basename 04/15/11 0945  LABPT --  INR 1.15    Neurologically intact Dorsiflexion/Plantar flexion intact Cellulitis is improved.  Assessment/Plan: Dr. Jacky Kindle will evaluate.   Jorgina Binning A 04/16/2011, 6:00 AM

## 2011-04-16 NOTE — Progress Notes (Signed)
Patient's RN accidentally released the pre-op signed and held orders.  Dr. Ermelinda Das PA was notified due to it being after hours.  The PA on call was unable to give any advice and mentioned that the pre-op orders should have been released anyway.  Pharmacy is aware and is to be on the look out for possible order mistakes.

## 2011-04-17 LAB — BASIC METABOLIC PANEL
BUN: 51 mg/dL — ABNORMAL HIGH (ref 6–23)
Calcium: 8.5 mg/dL (ref 8.4–10.5)
Creatinine, Ser: 1.81 mg/dL — ABNORMAL HIGH (ref 0.50–1.35)
GFR calc Af Amer: 41 mL/min — ABNORMAL LOW (ref >90)
GFR calc non Af Amer: 35 mL/min — ABNORMAL LOW (ref >90)

## 2011-04-17 LAB — PROTIME-INR: Prothrombin Time: 18.6 seconds — ABNORMAL HIGH (ref 11.6–15.2)

## 2011-04-17 LAB — CBC
MCHC: 33.5 g/dL (ref 30.0–36.0)
RDW: 14.6 % (ref 11.5–15.5)

## 2011-04-17 LAB — CARDIAC PANEL(CRET KIN+CKTOT+MB+TROPI): Relative Index: INVALID (ref 0.0–2.5)

## 2011-04-17 LAB — TYPE AND SCREEN
ABO/RH(D): A POS
Antibody Screen: NEGATIVE

## 2011-04-17 MED ORDER — ASPIRIN 81 MG PO CHEW
81.0000 mg | CHEWABLE_TABLET | Freq: Every day | ORAL | Status: DC
  Administered 2011-04-17 – 2011-04-21 (×5): 81 mg via ORAL
  Filled 2011-04-17 (×5): qty 1

## 2011-04-17 MED ORDER — ZOLPIDEM TARTRATE 5 MG PO TABS
5.0000 mg | ORAL_TABLET | Freq: Every evening | ORAL | Status: DC | PRN
  Administered 2011-04-17 – 2011-04-25 (×3): 5 mg via ORAL
  Filled 2011-04-17 (×3): qty 1

## 2011-04-17 NOTE — Progress Notes (Signed)
I spoke with Dr. Jacky Kindle regarding the venous doppler studies and due to the fact that the LLE has been done, he asked to cancel the new order.  I also spoke with Dr. Darrelyn Hillock and we are to discontinue all of the pre-op orders.  There is no plan for the patient to go to surgery.  Dr. Darrelyn Hillock reported that he would have his PA to follow, but that DC plans has been deferred to Dr. Jacky Kindle and he can DC patient when he feels ready.

## 2011-04-17 NOTE — Progress Notes (Signed)
Patient had a LLE venous doppler study the other day and has now ordered a BLE doppler.  Dr. Jacky Kindle notified to ask for clarification.

## 2011-04-17 NOTE — Progress Notes (Signed)
Subjective: Joe [name] seems much better this morning. His coloring is better and respiratory status is calm. He denies any chest pain or shortness of breath. He does relate that the pain with the leg is slowly improving.  Objective: Vital signs in last 24 hours: Temp:  [97.4 F (36.3 C)-98 F (36.7 C)] 98 F (36.7 C) (03/28 0543) Pulse Rate:  [64-84] 64  (03/28 0543) Resp:  [18] 18  (03/28 0543) BP: (92-157)/(55-79) 92/55 mmHg (03/28 0543) SpO2:  [95 %-98 %] 98 % (03/28 0543) Weight:  [119 kg (262 lb 5.6 oz)] 119 kg (262 lb 5.6 oz) (03/27 1020) Weight change: 0 kg (0 lb)  CBG (last 3)  No results found for this basename: GLUCAP:3 in the last 72 hours  Intake/Output from previous day: 03/27 0701 - 03/28 0700 In: 2403.3 [I.V.:2253.3; IV Piggyback:150] Out: 1625 [Urine:1625]  Physical Exam: Patient is awake alert no distress. He is no JVD or bruits. Lungs are clear. Cardiovascular exam is regular rate and rhythm no murmur. Abdomen is obese soft nontender no hepatosplenomegaly. Extremities warm distally intact distal pulses. Left lower extremity does reveal pretibial cellulitis is slowly receding in approximately one to 2+ edema with a negative Homans sign. Right lower extremity trace edema intact pulses. Neurologically he is nonlateralizing.   Lab Results:  [facility] 04/17/11 0528 04/16/11 0638  NA 128* 130*  K 5.1 5.6*  CL 101 98  CO2 18* 19  GLUCOSE 106* 103*  BUN 51* 60*  CREATININE 1.81* 1.97*  CALCIUM 8.5 9.1  MG -- --  PHOS -- 3.9    Basename 04/16/11 0638 04/15/11 0945  AST 60* 75*  ALT 58* 73*  ALKPHOS 178* 208*  BILITOT 0.6 0.6  PROT 6.7 7.4  ALBUMIN 2.2* 2.7*    Basename 04/17/11 0528 04/16/11 0638 04/15/11 0945  WBC 14.8* 21.7* --  NEUTROABS -- -- 17.4*  HGB 11.5* 13.1 --  HCT 34.3* 38.2* --  MCV 89.8 88.2 --  PLT 297 279 --   Lab Results  Component Value Date   INR 1.52* 04/17/2011   INR 1.17 04/16/2011   INR 1.15 04/15/2011    Basename 04/16/11  2325 04/16/11 1529 04/16/11 0638  CKTOTAL 25 31 37  CKMB 1.6 1.7 1.7  CKMBINDEX -- -- --  TROPONINI <0.30 <0.30 <0.30   No results found for this basename: TSH,T4TOTAL,FREET3,T3FREE,THYROIDAB in the last 72 hours No results found for this basename: VITAMINB12:2,FOLATE:2,FERRITIN:2,TIBC:2,IRON:2,RETICCTPCT:2 in the last 72 hours  Studies/Results: X-ray Chest Pa And Lateral   04/15/2011  *RADIOLOGY REPORT*  Clinical Data: Elevated white blood cell count.  Weakness.  CHEST - 2 VIEW  Comparison: 11/12/2006  Findings: Prior median sternotomy.  Cardiomegaly.  Lungs are clear. No effusions.  No acute bony abnormality.  IMPRESSION: Cardiomegaly.  No active cardiopulmonary disease.  Original Report Authenticated By: Cyndie Chime, M.D.   Dg Knee Left Port  04/15/2011  *RADIOLOGY REPORT*  Clinical Data: Left knee pain.  History of knee replacement.  PORTABLE LEFT KNEE - 1-2 VIEW  Comparison: Plain films 11/18/2006.  Findings: The patient has a cemented and constrained total knee arthroplasty.  The device is located.  There is lucency at the bone cement interface subjacent to the lateral plateaus and medial femoral condyle. Bone cement interface in the tibial diaphysis is unremarkable.  No acute fracture is identified.  The patient has an old patellar fracture.  No joint effusion.  IMPRESSION:  1.  Status post left total knee replacement.  Lucency at the bone cement  interface at the level of tibial metaphyses and medial femoral condyle is suggestive of loosening. Note is again made that no lucency about the tibial stem is seen. 2.  Old patellar fracture.  Original Report Authenticated By: Bernadene Bell. Maricela Curet, M.D.     Assessment/Plan: #1 left lower extremity cellulitis slowly improving with dropping white count afebrile  #2 coronary artery disease stable  #3 atrial fibrillation paroxysmal currently regular rate controlled  #4 acute on chronic renal failure improved with hydration  Plan continue IV  antibiotics and IV fluids. Mobilize   LOS: 2 days   Chastin Riesgo A 04/17/2011, 8:00 AM  2

## 2011-04-18 ENCOUNTER — Inpatient Hospital Stay (HOSPITAL_COMMUNITY): Payer: Medicare Other

## 2011-04-18 LAB — CBC
HCT: 33 % — ABNORMAL LOW (ref 39.0–52.0)
Hemoglobin: 10.9 g/dL — ABNORMAL LOW (ref 13.0–17.0)
MCH: 30 pg (ref 26.0–34.0)
MCHC: 33 g/dL (ref 30.0–36.0)
MCV: 90.9 fL (ref 78.0–100.0)

## 2011-04-18 LAB — BASIC METABOLIC PANEL
BUN: 51 mg/dL — ABNORMAL HIGH (ref 6–23)
CO2: 18 mEq/L — ABNORMAL LOW (ref 19–32)
Chloride: 101 mEq/L (ref 96–112)
GFR calc non Af Amer: 39 mL/min — ABNORMAL LOW (ref >90)
Glucose, Bld: 102 mg/dL — ABNORMAL HIGH (ref 70–99)
Potassium: 5.1 mEq/L (ref 3.5–5.1)

## 2011-04-18 MED ORDER — VANCOMYCIN HCL 1000 MG IV SOLR
1500.0000 mg | Freq: Once | INTRAVENOUS | Status: AC
  Administered 2011-04-18: 1500 mg via INTRAVENOUS
  Filled 2011-04-18: qty 1500

## 2011-04-18 MED ORDER — PERFLUTREN LIPID MICROSPHERE 6.52 MG/ML IV SUSP
10.0000 uL/kg | Freq: Once | INTRAVENOUS | Status: DC
  Filled 2011-04-18: qty 2

## 2011-04-18 MED ORDER — VANCOMYCIN HCL 1000 MG IV SOLR
1250.0000 mg | INTRAVENOUS | Status: DC
  Administered 2011-04-19 – 2011-04-21 (×3): 1250 mg via INTRAVENOUS
  Filled 2011-04-18 (×3): qty 1250

## 2011-04-18 MED ORDER — PIPERACILLIN-TAZOBACTAM 3.375 G IVPB
3.3750 g | Freq: Three times a day (TID) | INTRAVENOUS | Status: DC
  Administered 2011-04-18 – 2011-04-23 (×16): 3.375 g via INTRAVENOUS
  Filled 2011-04-18 (×19): qty 50

## 2011-04-18 NOTE — Progress Notes (Signed)
*  PRELIMINARY RESULTS* Echocardiogram 2D Echocardiogram has been performed.  Katheren Puller 04/18/2011, 12:35 PM

## 2011-04-18 NOTE — Evaluation (Signed)
Physical Therapy Evaluation Patient Details Name: Joe [name] MRN: [id number] DOB: 30-Oct-1935 Today's Date: 04/18/2011  Problem List:  Patient Active Problem List  Diagnoses  . Cellulitis of leg  . Atrial fibrillation    Past Medical History:  Past Medical History  Diagnosis Date  . Hypertension   . Hypercholesteremia   . Anxiety    Past Surgical History:  Past Surgical History  Procedure Date  . Coronary artery bypass graft   . Joint replacement   . Foot surgery   . Leg surgery   . Appendectomy     PT Assessment/Plan/Recommendation PT Assessment Clinical Impression Statement: Obese male who has been in bed for 3 days is limited by pain and edema in left leg.  He is unable to bear weight on it to walk a distance, but he is able to stand and step/ pivot to a chair with 1 person assist.  He wants to go home with his wife and would benefit from a wide wheelchair to get from car to house and a wide RW.  He will need HHPT at D/C to progress strength and moblity. PT Recommendation/Assessment: Patient will need skilled PT in the acute care venue PT Problem List: Decreased strength;Decreased activity tolerance;Pain;Obesity PT Therapy Diagnosis : Difficulty walking;Acute pain;Generalized weakness PT Plan PT Frequency: Min 3X/week PT Treatment/Interventions: DME instruction;Gait training;Functional mobility training;Therapeutic activities;Therapeutic exercise;Stair training;Patient/family education PT Recommendation Follow Up Recommendations: Home health PT Equipment Recommended: Rolling walker with 5" wheels;Wheelchair (measurements) (wide) PT Goals  Acute Rehab PT Goals PT Goal Formulation: With patient/family Time For Goal Achievement: 7 days Pt will go Supine/Side to Sit: with min assist PT Goal: Supine/Side to Sit - Progress: Goal set today Pt will go Sit to Supine/Side: with min assist PT Goal: Sit to Supine/Side - Progress: Goal set today Pt will go Sit to Stand: with  min assist PT Goal: Sit to Stand - Progress: Goal set today Pt will Transfer Bed to Chair/Chair to Bed: with min assist PT Transfer Goal: Bed to Chair/Chair to Bed - Progress: Goal set today  PT Evaluation Precautions/Restrictions  Precautions Required Braces or Orthoses: No Restrictions Weight Bearing Restrictions: No Prior Functioning  Home Living Lives With: Spouse Receives Help From: Family Type of Home: House Home Layout: One level Home Access: Ramped entrance;Stairs to enter Entrance Stairs-Number of Steps: 1 Home Adaptive Equipment: Straight cane;Walker - standard Prior Function Level of Independence: Independent with transfers;Independent with gait Able to Take Stairs?: Yes Driving: Yes Cognition Cognition Arousal/Alertness: Awake/alert Overall Cognitive Status: Appears within functional limits for tasks assessed Orientation Level: Oriented X4 Sensation/Coordination Coordination Gross Motor Movements are Fluid and Coordinated: Yes Extremity Assessment RLE Assessment RLE Assessment: Within Functional Limits LLE Assessment LLE Assessment: Exceptions to WFL LLE AROM (degrees) LLE Overall AROM Comments: pt with edema, redness, and pain in left lower leg.  He is unable to tolerate touch of leg and is unable to bear weight through it Mobility (including Balance) Bed Mobility Bed Mobility: Yes Supine to Sit: 3: Mod assist Supine to Sit Details (indicate cue type and reason): pt had some difficulty moving on soft mattress Transfers Transfers: Yes Sit to Stand: 3: Mod assist Sit to Stand Details (indicate cue type and reason): pt practiced several times and improved with practice.  He occasionally needs cues to initiate lifting hips up and cues to push up with arms Stand to Sit: 4: Min assist Stand to Sit Details: cues to reach back with arms to control descent Ambulation/Gait Ambulation/Gait: Yes  Ambulation/Gait Assistance: 3: Mod assist Ambulation/Gait Assistance  Details (indicate cue type and reason): pt has much difficulty stepping on left leg due to pain.  He had diffficulty raising heel up to pivot on toes and so ultimately pushed up on arms to step to get to chair  Ambulation Distance (Feet): 3 Feet Assistive device: Rolling walker (wide) Gait Pattern: Step-to pattern;Decreased stance time - left Stairs: No Wheelchair Mobility Wheelchair Mobility: No  Posture/Postural Control Posture/Postural Control: Postural limitations Postural Limitations: pt obese.  He tends to keep trunk forward flexed Balance Balance Assessed: No Exercise    End of Session PT - End of Session Activity Tolerance: Patient tolerated treatment well Patient left: in chair;with family/visitor present General Behavior During Session: [facility] for tasks performed Cognition: [facility] for tasks performed  Donnetta Hail 04/18/2011, 3:27 PM

## 2011-04-18 NOTE — Progress Notes (Signed)
Subjective: Joe [name] does not feel as well today by his report. Nothing specific. No chest pain or shortness of breath. The left lower Joe [name] he does burn and sting quite [name] bit. The venous Doppler was noted to be negative.  Objective: Vital signs in last 24 hours: Temp:  [97.6 F (36.4 C)-98.7 F (37.1 C)] 98.7 F (37.1 C) (03/29 0600) Pulse Rate:  [58-85] 85  (03/29 0600) Resp:  [20] 20  (03/29 0600) BP: (99-120)/(49-76) 120/76 mmHg (03/29 0600) SpO2:  [89 %-95 %] 95 % (03/29 0600) Weight change:   CBG (last 3)  No results found for this basename: GLUCAP:3 in the last 72 hours  Intake/Output from previous day: 03/28 0701 - 03/29 0700 In: 2387.9 [P.O.:480; I.V.:1907.9] Out: 955 [Urine:955]  Physical Exam: Patient is awake alert supine has not been out of bed. He is no distress. No JVD or bruits. Lungs are clear. Cardiovascular exam is irregular no murmur gallop rub or heave. Abdomen is obese soft nontender bowel sounds normal. Extremities both reveal trace to 1+ edema left greater than right and cellulitis over the pretibial area has not improved any. He does have intact distal pulses.   Lab Results:  Basename 04/18/11 0536 04/17/11 0528 04/16/11 0638  NA 129* 128* --  K 5.1 5.1 --  CL 101 101 --  CO2 18* 18* --  GLUCOSE 102* 106* --  BUN 51* 51* --  CREATININE 1.67* 1.81* --  CALCIUM 8.6 8.5 --  MG -- -- --  PHOS -- -- 3.9    Basename 04/16/11 0638  AST 60*  ALT 58*  ALKPHOS 178*  BILITOT 0.6  PROT 6.7  ALBUMIN 2.2*    Basename 04/18/11 0536 04/17/11 0528  WBC 16.0* 14.8*  NEUTROABS -- --  HGB 10.9* 11.5*  HCT 33.0* 34.3*  MCV 90.9 89.8  PLT 347 297   Lab Results  Component Value Date   INR 1.52* 04/17/2011   INR 1.17 04/16/2011   INR 1.15 04/15/2011    Basename 04/16/11 2325 04/16/11 1529 04/16/11 0638  CKTOTAL 25 31 37  CKMB 1.6 1.7 1.7  CKMBINDEX -- -- --  TROPONINI <0.30 <0.30 <0.30   No results found for this basename:  TSH,T4TOTAL,FREET3,T3FREE,THYROIDAB in the last 72 hours No results found for this basename: VITAMINB12:2,FOLATE:2,FERRITIN:2,TIBC:2,IRON:2,RETICCTPCT:2 in the last 72 hours  Studies/Results: No results found.   Assessment/Plan: #1 left lower Joe [name] he cellulitis no evidence of DVT seems stagnant at this point will change the Ancef over to vancomycin and broaden the coverage. We'll also get [name] CT of the soft tissues rule out abscess particularly given the pins and potential for foreign bodies.  #2 atrial fibrillation rate is currently controlled once the CT is complete we will restart Coumadin I cannot find any results of the echo and we'll reorder this. He appears well compensated at this time.  #3 coronary artery disease stable  #4 essential hypertension stable  #5 acute on chronic renal failure slowly improving   LOS: 3 days   Joe [name] 04/18/2011, 10:14 AM

## 2011-04-18 NOTE — Progress Notes (Signed)
ANTIBIOTIC CONSULT NOTE - INITIAL  Pharmacy Consult for Vancomycin, Zosyn Indication: LLE Cellulitis  Allergies  Allergen Reactions  . Biaxin Rash    Patient Measurements: Height: 5\' 10"  (177.8 cm) Weight: 262 lb 5.6 oz (119 kg) IBW/kg (Calculated) : 73    Vital Signs: Temp: 98.7 F (37.1 C) (03/29 0600) Temp src: Oral (03/29 0600) BP: 120/76 mmHg (03/29 0600) Pulse Rate: 85  (03/29 0600) Intake/Output from previous day: 03/28 0701 - 03/29 0700 In: 2387.9 [P.O.:480; I.V.:1907.9] Out: 955 [Urine:955] Intake/Output from this shift: Total I/O In: -  Out: 200 [Urine:200]  Labs:  [facility] 04/18/11 0536 04/17/11 0528 04/16/11 0638  WBC 16.0* 14.8* 21.7*  HGB 10.9* 11.5* 13.1  PLT 347 297 279  LABCREA -- -- --  CREATININE 1.67* 1.81* 1.97*   Estimated Creatinine Clearance: 50.9 ml/min (by C-G formula based on Cr of 1.67). Normalized Creatinine Clearance: 85ml/min/72kg    Microbiology: Recent Results (from the past 720 hour(s))  CULTURE, BLOOD (ROUTINE X 2)     Status: Normal (Preliminary result)   Collection Time   04/15/11 12:30 PM      Component Value Range Status Comment   Specimen Description BLOOD RIGHT ARM   Final    Special Requests BOTTLES DRAWN AEROBIC ONLY 3CC   Final    Culture  Setup Time [id number]   Final    Culture     Final    Value:        BLOOD CULTURE RECEIVED NO GROWTH TO DATE CULTURE WILL BE HELD FOR 5 DAYS BEFORE ISSUING A FINAL NEGATIVE REPORT   Report Status PENDING   Incomplete     Medical History: Past Medical History  Diagnosis Date  . Hypertension   . Hypercholesteremia   . Anxiety     Medications:  Scheduled:    . ALPRAZolam  0.25 mg Oral Daily  . aspirin  81 mg Oral Daily  . atorvastatin  40 mg Oral Daily  .  ceFAZolin (ANCEF) IV  1 g Intravenous Q8H  . enoxaparin (LOVENOX) injection  30 mg Subcutaneous Q24H  . isosorbide mononitrate  60 mg Oral Daily  . lisinopril  10 mg Oral Daily   Infusions:    . sodium  chloride 75 mL/hr at 04/17/11 1543   PRN: acetaminophen, acetaminophen, ALPRAZolam, bisacodyl, ondansetron (ZOFRAN) IV, ondansetron, oxyCODONE-acetaminophen, polyethylene glycol, traMADol, zolpidem  Assessment:  76 year old M with cellulitis LLE, persistent leukocytosis despite empiric cefazolin.  Acute on chronic renal insufficiency, slowly improving.  Goal of Therapy:   Vancomycin trough 10-15  Zosyn adjusted to renal function  Plan:   Vancomycin 1500mg  IV x 1, then 1250mg  IV q24h  Zosyn 3.375 grams IV q8h (extended-infusion, each dose over 4 hours).  Follow serum creatinine.  Check Vancomycin trough if therapy continues beyond weekend, or sooner if serum creatinine rises significantly.  Elie Goody, PharmD, BCPS Pager: [phone] 04/18/2011  11:07 AM     Joe [name], Joe [name] 04/18/2011,11:00 AM

## 2011-04-18 NOTE — Progress Notes (Signed)
Echo technician here in room to perform ECHO with Contrast.  Definity solution drawn up in syringe with 8.51ml normal saline.  1.36ml of definity solution injected IV at technician instruction at 1158.  2.69ml of definity solution injected IV at technician instruction at 1203pm.  Patient verbalized he was without pain, vital signs charted in flow sheet.

## 2011-04-19 ENCOUNTER — Encounter (HOSPITAL_COMMUNITY)
Admission: EM | Disposition: A | Discharge status: Home Health | Payer: Self-pay | Source: Home / Self Care | Attending: Internal Medicine

## 2011-04-19 ENCOUNTER — Encounter (HOSPITAL_COMMUNITY): Payer: Self-pay | Admitting: Anesthesiology

## 2011-04-19 ENCOUNTER — Inpatient Hospital Stay (HOSPITAL_COMMUNITY): Payer: Medicare Other | Admitting: Anesthesiology

## 2011-04-19 HISTORY — PX: I&D EXTREMITY: SHX5045

## 2011-04-19 LAB — CBC
MCH: 30.8 pg (ref 26.0–34.0)
MCHC: 34.1 g/dL (ref 30.0–36.0)
MCV: 90.2 fL (ref 78.0–100.0)
Platelets: 372 10*3/uL (ref 150–400)

## 2011-04-19 LAB — BASIC METABOLIC PANEL
BUN: 57 mg/dL — ABNORMAL HIGH (ref 6–23)
CO2: 19 mEq/L (ref 19–32)
Calcium: 8.5 mg/dL (ref 8.4–10.5)
GFR calc non Af Amer: 37 mL/min — ABNORMAL LOW (ref >90)
Glucose, Bld: 101 mg/dL — ABNORMAL HIGH (ref 70–99)

## 2011-04-19 LAB — SYNOVIAL CELL COUNT + DIFF, W/ CRYSTALS
Eosinophils-Synovial: 0 % (ref 0–1)
Monocyte-Macrophage-Synovial Fluid: 2 % — ABNORMAL LOW (ref 50–90)

## 2011-04-19 SURGERY — IRRIGATION AND DEBRIDEMENT EXTREMITY
Anesthesia: General | Site: Leg Lower | Laterality: Left | Wound class: Dirty or Infected

## 2011-04-19 MED ORDER — FENTANYL CITRATE 0.05 MG/ML IJ SOLN
INTRAMUSCULAR | Status: DC | PRN
  Administered 2011-04-19: 50 ug via INTRAVENOUS
  Administered 2011-04-19: 100 ug via INTRAVENOUS
  Administered 2011-04-19: 50 ug via INTRAVENOUS

## 2011-04-19 MED ORDER — EPHEDRINE SULFATE 50 MG/ML IJ SOLN: 10.0000 mg | Freq: Once | INTRAMUSCULAR | Status: DC

## 2011-04-19 MED ORDER — SODIUM CHLORIDE 0.9 % IV BOLUS (SEPSIS)
500.0000 mL | Freq: Once | INTRAVENOUS | Status: AC
  Administered 2011-04-19: 500 mL via INTRAVENOUS

## 2011-04-19 MED ORDER — FENTANYL CITRATE 0.05 MG/ML IJ SOLN: 25.0000 ug | INTRAMUSCULAR | Status: DC | PRN

## 2011-04-19 MED ORDER — LIDOCAINE HCL (CARDIAC) 20 MG/ML IV SOLN
INTRAVENOUS | Status: DC | PRN
  Administered 2011-04-19: 100 mg via INTRAVENOUS

## 2011-04-19 MED ORDER — EPHEDRINE SULFATE 50 MG/ML IJ SOLN
10.0000 mg | Freq: Once | INTRAMUSCULAR | Status: AC
  Administered 2011-04-19: 10 mg via INTRAVENOUS
  Filled 2011-04-19: qty 0.2

## 2011-04-19 MED ORDER — SODIUM CHLORIDE 0.9 % IV SOLN: INTRAVENOUS | Status: DC

## 2011-04-19 MED ORDER — ONDANSETRON HCL 4 MG/2ML IJ SOLN
INTRAMUSCULAR | Status: DC | PRN
  Administered 2011-04-19: 4 mg via INTRAVENOUS

## 2011-04-19 MED ORDER — PROPOFOL 10 MG/ML IV EMUL
INTRAVENOUS | Status: DC | PRN
  Administered 2011-04-19: 200 mg via INTRAVENOUS

## 2011-04-19 MED ORDER — HYDROMORPHONE HCL PF 1 MG/ML IJ SOLN
INTRAMUSCULAR | Status: DC | PRN
  Administered 2011-04-19: .4 mg via INTRAVENOUS
  Administered 2011-04-19: .6 mg via INTRAVENOUS

## 2011-04-19 MED ORDER — SODIUM CHLORIDE 0.9 % IR SOLN
Status: DC | PRN
  Administered 2011-04-19: 3000 mL

## 2011-04-19 MED ORDER — LACTATED RINGERS IV SOLN: INTRAVENOUS | Status: DC

## 2011-04-19 MED ORDER — EPHEDRINE SULFATE 50 MG/ML IJ SOLN
5.0000 mg | INTRAMUSCULAR | Status: DC
  Filled 2011-04-19 (×2): qty 0.1

## 2011-04-19 MED ORDER — MEPERIDINE HCL 25 MG/ML IJ SOLN: 6.2500 mg | INTRAMUSCULAR | Status: DC | PRN

## 2011-04-19 MED ORDER — SUCCINYLCHOLINE CHLORIDE 20 MG/ML IJ SOLN
INTRAMUSCULAR | Status: DC | PRN
  Administered 2011-04-19: 100 mg via INTRAVENOUS

## 2011-04-19 MED ORDER — SODIUM CHLORIDE 0.9 % IV SOLN
INTRAVENOUS | Status: DC | PRN
  Administered 2011-04-19: 21:00:00 via INTRAVENOUS

## 2011-04-19 MED ORDER — PROMETHAZINE HCL 25 MG/ML IJ SOLN: 6.2500 mg | INTRAMUSCULAR | Status: DC | PRN

## 2011-04-19 MED ORDER — EPHEDRINE SULFATE 50 MG/ML IJ SOLN
INTRAMUSCULAR | Status: DC | PRN
  Administered 2011-04-19 (×2): 10 mg via INTRAVENOUS

## 2011-04-19 MED ORDER — 0.9 % SODIUM CHLORIDE (POUR BTL) OPTIME
TOPICAL | Status: DC | PRN
  Administered 2011-04-19: 1000 mL

## 2011-04-19 MED ORDER — ACETAMINOPHEN 10 MG/ML IV SOLN
INTRAVENOUS | Status: DC | PRN
  Administered 2011-04-19: 1000 mg via INTRAVENOUS

## 2011-04-19 SURGICAL SUPPLY — 35 items
BAG SPEC THK2 15X12 ZIP CLS (MISCELLANEOUS)
BAG ZIPLOCK 12X15 (MISCELLANEOUS) IMPLANT
BANDAGE ELASTIC 6 VELCRO ST LF (GAUZE/BANDAGES/DRESSINGS) ×2 IMPLANT
BANDAGE GAUZE ELAST BULKY 4 IN (GAUZE/BANDAGES/DRESSINGS) ×2 IMPLANT
CLOTH BEACON ORANGE TIMEOUT ST (SAFETY) ×2 IMPLANT
CUFF TOURN SGL QUICK 18 (TOURNIQUET CUFF) IMPLANT
CUFF TOURN SGL QUICK 24 (TOURNIQUET CUFF) ×2
CUFF TRNQT CYL 24X4X40X1 (TOURNIQUET CUFF) IMPLANT
DRSG ADAPTIC 3X8 NADH LF (GAUZE/BANDAGES/DRESSINGS) ×1 IMPLANT
DRSG PAD ABDOMINAL 8X10 ST (GAUZE/BANDAGES/DRESSINGS) ×4 IMPLANT
DURAPREP 26ML APPLICATOR (WOUND CARE) ×2 IMPLANT
ELECT REM PT RETURN 9FT ADLT (ELECTROSURGICAL) ×2
ELECTRODE REM PT RTRN 9FT ADLT (ELECTROSURGICAL) ×1 IMPLANT
EVACUATOR 1/8 PVC DRAIN (DRAIN) ×1 IMPLANT
GLOVE SURG ORTHO 8.0 STRL STRW (GLOVE) ×2 IMPLANT
GLOVE SURG SS PI 8.0 STRL IVOR (GLOVE) ×2 IMPLANT
GOWN STRL REIN XL XLG (GOWN DISPOSABLE) ×3 IMPLANT
HANDPIECE INTERPULSE COAX TIP (DISPOSABLE) ×2
KIT BASIN OR (CUSTOM PROCEDURE TRAY) ×2 IMPLANT
PACK LOWER EXTREMITY WL (CUSTOM PROCEDURE TRAY) ×2 IMPLANT
POSITIONER SURGICAL ARM (MISCELLANEOUS) ×2 IMPLANT
SET HNDPC FAN SPRY TIP SCT (DISPOSABLE) ×1 IMPLANT
SPONGE GAUZE 4X4 12PLY (GAUZE/BANDAGES/DRESSINGS) ×2 IMPLANT
SPONGE LAP 18X18 X RAY DECT (DISPOSABLE) ×2 IMPLANT
SPONGE LAP 4X18 X RAY DECT (DISPOSABLE) IMPLANT
STOCKINETTE 4X48 STRL (DRAPES) ×1 IMPLANT
SUT ETHILON 3 0 PS 1 (SUTURE) ×2 IMPLANT
SUT VIC AB 0 CT1 36 (SUTURE) ×2 IMPLANT
SUT VIC AB 2-0 CT1 27 (SUTURE) ×2
SUT VIC AB 2-0 CT1 TAPERPNT 27 (SUTURE) ×1 IMPLANT
SWAB COLLECTION DEVICE MRSA (MISCELLANEOUS) ×3 IMPLANT
SYR 50ML LL SCALE MARK (SYRINGE) ×2 IMPLANT
SYR CONTROL 10ML LL (SYRINGE) IMPLANT
TOWEL OR 17X26 10 PK STRL BLUE (TOWEL DISPOSABLE) ×4 IMPLANT
TUBE ANAEROBIC SPECIMEN COL (MISCELLANEOUS) ×4 IMPLANT

## 2011-04-19 NOTE — Op Note (Signed)
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 Insertion of drain. #2 left knee aspiration Surgeon Valma Cava M.D. Assistant Kingsley Spittle Anesthesia Gen. Drains one medium Hemovac sewn in place Complications none Disposition to PACU stable Findings left knee aspirate revealed approximately 150 cc of thin purulent material sent for Gram stain and culture and crystal analysis, left calf exploration revealed large deep abscess thin purulent material chronic fibrinous exudate doesn't tract to medial aspect of the knee. Completely decompressed and drained of this irrigation drying sewn in place.  Operative details Patient was counseled in the holding area cracks were marked and signed appropriately. Taken to the operating room for surgical anesthesia. Comment was done and confirmed by all involved in the room. Left knee was prepped with Betadine and aspiration was performed utilizing sterile technique. Approximately 150 cc of thin purulent material was removed from the left knee and sent for Gram stain culture cell count differential and crystal analysis. Labral structures and elevated it was prepped with DuraPrep the knee was draped out of the surgical site lower extremity was held elevated and tourniquet was inflated 300 mm of mercury. The area that was abscess was identified with topographical landmarks as best by the CT scan. An incision is made anteromedial aspect of his calf to the skin and subcutaneous tissue saphenous vein and nerve were retracted out of the way. Fascia was opened and with blunt digital aspiration Patient between the medial head of the gastrocnemius and the soleus muscle for a large abscess pocket with new CT scan didn't and purulent material was chronic fibrinous exudate present. This area was th opened drained and a  copious irrigation and more Gram stain and cultures have been obtained prior to this. Following this a medium Hemovac drain was placed tourniquet was deflated minimal bleeding. Surgical incision site was loosely approximated with nylon suture. The cath at this time was much softer and no other abscess pockets were palpated nor found surgically. Sterile dressing was applied drain was hooked to suction patient was awakened and taken from the operating room to the PACU in stable condition. At the patient's request his wife was called at home after the procedure.

## 2011-04-19 NOTE — Addendum Note (Signed)
Addendum  created 04/19/11 2157 by Florene Route, CRNA   Modules edited:Orders

## 2011-04-19 NOTE — Anesthesia Postprocedure Evaluation (Signed)
  Anesthesia Post-op Note  Patient: Joe [name]  Procedure(s) Performed: Procedure(s) (LRB): IRRIGATION AND DEBRIDEMENT EXTREMITY (Left)  Patient Location: PACU  Anesthesia Type: General  Level of Consciousness: awake and alert   Airway and Oxygen Therapy: Patient Spontanous Breathing  Post-op Pain: mild  Post-op Assessment: Post-op Vital signs reviewed, Patient's Cardiovascular Status Stable, Respiratory Function Stable, Patent Airway and No signs of Nausea or vomiting  Post-op Vital Signs: stable  Complications: No apparent anesthesia complications

## 2011-04-19 NOTE — Addendum Note (Signed)
Addendum  created 04/19/11 2133 by Chrisette Man L Dauna Ziska, CRNA   Modules edited:Orders    

## 2011-04-19 NOTE — Preoperative (Signed)
Beta Blockers   Reason not to administer Beta Blockers:Not Applicable 

## 2011-04-19 NOTE — Transfer of Care (Signed)
Immediate Anesthesia Transfer of Care Note  Patient: Joe [name]  Procedure(s) Performed: Procedure(s) (LRB): IRRIGATION AND DEBRIDEMENT EXTREMITY (Left)  Patient Location: PACU  Anesthesia Type: General  Level of Consciousness: awake and alert   Airway & Oxygen Therapy: Patient Spontanous Breathing and Patient connected to face mask oxygen  Post-op Assessment: Report given to PACU RN and Post -op Vital signs reviewed and stable  Post vital signs: Reviewed and stable  Complications: No apparent anesthesia complications

## 2011-04-19 NOTE — Anesthesia Procedure Notes (Signed)
Procedure Name: Intubation Date/Time: 04/19/2011 8:26 PM Performed by: Leroy Libman L Patient Re-evaluated:Patient Re-evaluated prior to inductionOxygen Delivery Method: Circle system utilized Preoxygenation: Pre-oxygenation with 100% oxygen Intubation Type: IV induction, Cricoid Pressure applied and Rapid sequence Laryngoscope Size: Miller and 3 Grade View: Grade I Tube type: Oral Tube size: 8.0 mm Number of attempts: 1 Airway Equipment and Method: Stylet Placement Confirmation: ETT inserted through vocal cords under direct vision,  breath sounds checked- equal and bilateral and positive ETCO2 Secured at: 21 cm Tube secured with: Tape Dental Injury: Teeth and Oropharynx as per pre-operative assessment

## 2011-04-19 NOTE — Progress Notes (Signed)
Subjective: Joe [name] claims to be feeling some better but the left lower extremity still results in significant pain and limited ambulation. The redness may have dissipated some but is just as tight and just is painful. He's had no nausea vomiting. He had no chest pain.  Objective: Vital signs in last 24 hours: Temp:  [98.4 F (36.9 C)-98.5 F (36.9 C)] 98.5 F (36.9 C) (03/30 0600) Pulse Rate:  [68-82] 72  (03/30 0600) Resp:  [18-20] 19  (03/30 0600) BP: (92-113)/(54-71) 113/71 mmHg (03/30 0600) SpO2:  [92 %-99 %] 99 % (03/30 0600) Weight change:   CBG (last 3)  No results found for this basename: GLUCAP:3 in the last 72 hours  Intake/Output from previous day: 03/29 0701 - 03/30 0700 In: 480 [P.O.:480] Out: 200 [Urine:200]  Physical Exam: Patient is awake alert supine no distress. He is no JVD or bruits. Lungs are clear. Cardiovascular there is irregular no murmur. Abdomen is obese soft nontender bowel sounds normal. Left lower Joe [name] he distal to the knee 1-2+ edema brawny tight significant erythema mid tibia wrapping around down to the ankle questionably improved. Distal pulses intact. Capillary refill of the foot intact. Right lower extremity is trace edema otherwise normal. Neurologically he is intact.   Lab Results:  [facility] 04/19/11 0613 04/18/11 0536  NA 129* 129*  K 5.1 5.1  CL 101 101  CO2 19 18*  GLUCOSE 101* 102*  BUN 57* 51*  CREATININE 1.74* 1.67*  CALCIUM 8.5 8.6  MG -- --  PHOS -- --   No results found for this basename: AST:2,ALT:2,ALKPHOS:2,BILITOT:2,PROT:2,ALBUMIN:2 in the last 72 hours  Basename 04/19/11 0613 04/18/11 0536  WBC 11.8* 16.0*  NEUTROABS -- --  HGB 10.1* 10.9*  HCT 29.6* 33.0*  MCV 90.2 90.9  PLT 372 347   Lab Results  Component Value Date   INR 1.52* 04/17/2011   INR 1.17 04/16/2011   INR 1.15 04/15/2011    Basename 04/16/11 2325 04/16/11 1529  CKTOTAL 25 31  CKMB 1.6 1.7  CKMBINDEX -- --  TROPONINI <0.30 <0.30   No results  found for this basename: TSH,T4TOTAL,FREET3,T3FREE,THYROIDAB in the last 72 hours No results found for this basename: VITAMINB12:2,FOLATE:2,FERRITIN:2,TIBC:2,IRON:2,RETICCTPCT:2 in the last 72 hours  Studies/Results: Ct Tibia Fibula Left Wo Contrast  04/18/2011  *RADIOLOGY REPORT*  Clinical Data: Leg redness.  Evaluate for abscess.  CT TIBIA FIBULA LEFT WITHOUT CONTRAST  Technique:  Contiguous noncontrast axial images were obtained of the left leg from both the knee to below the ankle.  Coronal and sagittal reconstructions were created in bone and soft tissue windows.  Comparison: 04/15/2011.  Findings: Please note that noncontrast technique gives suboptimal visualization of vascular/enhancement of fluid.  Within this limitation, there is pronounced infiltration of the dorsal subcutaneous tissues of the leg.  Additionally, in the posterior compartment, interposed between the medial head of the gastrocnemius and the soleus muscle, there is [name] focal fluid collection with [name] tiny locule of gas highly suspicious for multi locular abscess.  On axial image number 65, this measures 78 mm AP by 41 mm transverse.  Craniocaudal extent is 17 cm.  Medial leg varicosities are present with phleboliths.  Severe below-the-knee atherosclerosis.  The bones demonstrate osteolysis around the cemented Constrained knee arthroplasty which may represent chronic infection or particle disease.  Ordinary mechanical loosening is considered less likely.  This is most pronounced around the patellar component and femoral component.  There is also lucency around the bone cement in the tibial plateau.  IMPRESSION:  1.  Elongated medial posterior compartment fluid collection with gas locules suspicious for deep abscess interposed between the medial head gastrocnemius and the soleus muscle. 2.  Severe subcutaneous edema/fluid. 3.  Osteolysis around the total knee arthroplasty, likely representing either chronic infection or particle disease, with  mechanical loosening considered unlikely.  Original Report Authenticated By: Andreas Newport, M.D.     Assessment/Plan: #1 left lower extremity cellulitis with questionable abscess formation in the posterior compartment and questionable infection of the left knee prosthesis by CT scanning. White count has dropped with antibiotic change and he is afebrile but current symptoms and exam are still concerning involving this left leg with little progress. I have discussed this with Dr. Hayden Rasmussen her [place] or 2 who will reevaluate the patient and have ordered interventional radiology to try and drain and/or aspirate this area  #2 atrial fibrillation rate controlled on low-dose Lovenox will not restart Coumadin until we ascertain that no further intervention surgically or needed  #3 coronary artery disease stable  #4 essential hypertension stable  #5 acute on chronic renal failure close to baseline   LOS: 4 days   Joe [name] 04/19/2011, 11:57 AM

## 2011-04-19 NOTE — Addendum Note (Signed)
Addendum  created 04/19/11 2202 by Florene Route, CRNA   Modules edited:Inpatient [facility]   Inpatient [name] -- Order [id number] @ 04/19/11 2202

## 2011-04-19 NOTE — Addendum Note (Signed)
Addendum  created 04/19/11 2139 by Florene Route, CRNA   Modules edited:Inpatient [facility]   Inpatient [name] -- Order [id number] @ 04/19/11 2132

## 2011-04-19 NOTE — Progress Notes (Signed)
Subjective: Cross coverage note I was called today by Dr. Marius Ditch and reported to me that the patient had not responded to IV antibiotics and had been expected. The hours and had ordered a CT scan demonstrating abscess in the left calf. I have evaluated the patient due to all x-rays and CT scan I have also discussed this with Dr. Melina Modena. I explained to the patient I was concerned that he may have a chronic infection of his total knee arthroplasty which is the cause of the abscess secondary cellulitis of his leg. I examine the patient and I his knee was swollen with a large effusion and tender. However, he had marked erythema in the distal one third of the leg. He is very tender along the entire calf. I explained to the patient to go to my surgery. Objctive: Vital signs in last 24 hours: Temp:  [98.4 F (36.9 C)-98.5 F (36.9 C)] 98.4 F (36.9 C) (03/30 1413) Pulse Rate:  [67-75] 67  (03/30 1413) Resp:  [18-20] 18  (03/30 1413) BP: (90-113)/(53-71) 90/53 mmHg (03/30 1413) SpO2:  [96 %-99 %] 97 % (03/30 1413)  Intake/Output from previous day: 03/29 0701 - 03/30 0700 In: 480 [P.O.:480] Out: 200 [Urine:200] Intake/Output this shift: Total I/O In: 100 [I.V.:100] Out: -    Basename 04/19/11 0613 04/18/11 0536 04/17/11 0528  HGB 10.1* 10.9* 11.5*    Basename 04/19/11 0613 04/18/11 0536  WBC 11.8* 16.0*  RBC 3.28* 3.63*  HCT 29.6* 33.0*  PLT 372 347    Basename 04/19/11 0613 04/18/11 0536  NA 129* 129*  K 5.1 5.1  CL 101 101  CO2 19 18*  BUN 57* 51*  CREATININE 1.74* 1.67*  GLUCOSE 101* 102*  CALCIUM 8.5 8.6    Basename 04/17/11 0528  LABPT --  INR 1.52*   please see above note for physical examination.   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 I explained to Joe [name] to go to my surgery was to incise and drain the abscess in his calf I said drain also aspirate  his knee and sent it for definitive cultures. He does understand there may be was surgical procedures involved and Dr. Darrelyn Hillock return on Monday and reason his care I also explained this to Dr. Marius Ditch tonight via telephone. Patient understands risk and benefits of surgery wishes to proceed.   Dmiya Malphrus ANDREW 04/19/2011, 8:09 PM

## 2011-04-19 NOTE — Progress Notes (Signed)
Physical Therapy Treatment Patient Details Name: ARLAND USERY MRN: [id number] DOB: [birth year] Today's Date: 04/19/2011  L LE cellulitis 11:15 - 11:35 1 ta  PT Assessment/Plan  PT - Assessment/Plan Comments on Treatment Session: Only assisted pt from bed to recliner.  Pt unable to WB or even place L foot on floor 2nd MAX c/o pain.  Pt pivoted 1/4 turn only to his right. PT Plan: Discharge plan remains appropriate Follow Up Recommendations: Home health PT PT Goals  Acute Rehab PT Goals PT Goal Formulation: With patient Pt will go Supine/Side to Sit: with min assist PT Goal: Supine/Side to Sit - Progress: Progressing toward goal Pt will go Sit to Supine/Side: with min assist PT Goal: Sit to Supine/Side - Progress: Progressing toward goal Pt will go Sit to Stand: with min assist PT Goal: Sit to Stand - Progress: Progressing toward goal Pt will Transfer Bed to Chair/Chair to Bed: with min assist PT Transfer Goal: Bed to Chair/Chair to Bed - Progress: Progressing toward goal  PT Treatment Precautions/Restrictions  Precautions Required Braces or Orthoses: No Restrictions Weight Bearing Restrictions: Yes Mobility (including Balance) Bed Mobility Bed Mobility: Yes Supine to Sit: 4: Min assist Supine to Sit Details (indicate cue type and reason): only required increased time and min assist to support L LE off bed 2nd pain Transfers Transfers: Yes Sit to Stand: 4: Min assist;From bed;From elevated surface Sit to Stand Details (indicate cue type and reason): one VC on hand placement Stand to Sit: 4: Min assist;To chair/3-in-1 Stand to Sit Details: one VC on hand placement Ambulation/Gait Ambulation/Gait: No Ambulation/Gait Assistance Details (indicate cue type and reason): Pt unable to amb 2nd 8/10 L LE pain.  Pt describes it as "burning", "hot".  Pt unable to place foot on floor. Stairs: No Corporate treasurer: No    Exercise    End of Session PT - End of  Session Equipment Utilized During Treatment: Gait belt Activity Tolerance: Patient limited by pain Patient left: in chair;with call bell in reach Nurse Communication: Other (comment) (pain meds) General Behavior During Session: [facility] for tasks performed Cognition: [facility] for tasks performed  Felecia Shelling  PTA [facility]  Acute  Rehab Pager     [phone]

## 2011-04-19 NOTE — Addendum Note (Signed)
Addendum  created 04/19/11 2133 by Florene Route, CRNA   Modules edited:Orders

## 2011-04-19 NOTE — Anesthesia Preprocedure Evaluation (Signed)
Anesthesia Evaluation  Patient identified by MRN, date of birth, ID band Patient awake    Reviewed: Allergy & Precautions, H&P , NPO status , Patient's Chart, lab work & pertinent test results  Airway Mallampati: II TM Distance: >3 FB Neck ROM: Full    Dental No notable dental hx.    Pulmonary neg pulmonary ROS,  breath sounds clear to auscultation  Pulmonary exam normal       Cardiovascular hypertension, Pt. on medications negative cardio ROS  + dysrhythmias Atrial Fibrillation Rhythm:Regular Rate:Normal  S/p CABG   Neuro/Psych negative neurological ROS  negative psych ROS   GI/Hepatic negative GI ROS, Neg liver ROS,   Endo/Other  negative endocrine ROS  Renal/GU negative Renal ROS  negative genitourinary   Musculoskeletal negative musculoskeletal ROS (+)   Abdominal   Peds negative pediatric ROS (+)  Hematology negative hematology ROS (+)   Anesthesia Other Findings   Reproductive/Obstetrics negative OB ROS                           Anesthesia Physical Anesthesia Plan  ASA: III  Anesthesia Plan: General   Post-op Pain Management:    Induction: Intravenous  Airway Management Planned:   Additional Equipment:   Intra-op Plan:   Post-operative Plan:   Informed Consent: I have reviewed the patients History and Physical, chart, labs and discussed the procedure including the risks, benefits and alternatives for the proposed anesthesia with the patient or authorized representative who has indicated his/her understanding and acceptance.   Dental advisory given  Plan Discussed with: CRNA  Anesthesia Plan Comments:         Anesthesia Quick Evaluation

## 2011-04-19 NOTE — Addendum Note (Signed)
Addendum  created 04/19/11 2157 by Legrand Lasser L Kodie Pick, CRNA   Modules edited:Orders    

## 2011-04-20 ENCOUNTER — Inpatient Hospital Stay (HOSPITAL_COMMUNITY): Payer: Medicare Other

## 2011-04-20 LAB — BASIC METABOLIC PANEL
BUN: 50 mg/dL — ABNORMAL HIGH (ref 6–23)
Chloride: 104 mEq/L (ref 96–112)
Creatinine, Ser: 1.73 mg/dL — ABNORMAL HIGH (ref 0.50–1.35)
GFR calc non Af Amer: 37 mL/min — ABNORMAL LOW (ref >90)
Glucose, Bld: 122 mg/dL — ABNORMAL HIGH (ref 70–99)
Potassium: 5 mEq/L (ref 3.5–5.1)

## 2011-04-20 LAB — CBC
HCT: 23 % — ABNORMAL LOW (ref 39.0–52.0)
Hemoglobin: 7.7 g/dL — ABNORMAL LOW (ref 13.0–17.0)
MCH: 30.4 pg (ref 26.0–34.0)
MCHC: 33.5 g/dL (ref 30.0–36.0)

## 2011-04-20 MED ORDER — ENOXAPARIN SODIUM 30 MG/0.3ML ~~LOC~~ SOLN
30.0000 mg | SUBCUTANEOUS | Status: DC
Start: 2011-04-20 — End: 2011-04-21
  Administered 2011-04-20 – 2011-04-21 (×2): 30 mg via SUBCUTANEOUS
  Filled 2011-04-20 (×2): qty 0.3

## 2011-04-20 MED ORDER — PHENYLEPHRINE HCL 10 MG/ML IJ SOLN
30.0000 ug/min | INTRAVENOUS | Status: DC
  Administered 2011-04-20: 8 ug/min via INTRAVENOUS
  Administered 2011-04-20: 30 ug/min via INTRAVENOUS
  Filled 2011-04-20 (×2): qty 1

## 2011-04-20 NOTE — Progress Notes (Signed)
Report given to [place], rn

## 2011-04-20 NOTE — Progress Notes (Signed)
Subjective: Patient is doing well postoperative day #1. He is about a bit frustrated by the initial course and the approach taken by the initial orthopedic surgeon but I did explain that all seemed appropriate to me it was only after 48 hours but this the abscess declared itself in the course of been changed. He is stable medically and pain seems well-controlled.  Objective: Vital signs in last 24 hours: Temp:  [97.4 F (36.3 C)-98.9 F (37.2 C)] 97.7 F (36.5 C) (03/31 1200) Pulse Rate:  [29-94] 79  (03/31 1200) Resp:  [13-24] 22  (03/31 1200) BP: (67-137)/(28-98) 107/47 mmHg (03/31 1200) SpO2:  [80 %-100 %] 100 % (03/31 1100) FiO2 (%):  [2 %] 2 % (03/31 0400) Weight change:   CBG (last 3)  No results found for this basename: GLUCAP:3 in the last 72 hours  Intake/Output from previous day: 03/30 0701 - 03/31 0700 In: 2813.2 [I.V.:2300.7; IV Piggyback:512.5] Out: 1400 [Urine:1400]  Physical Exam: Patient is awake alert no distress. Facial symmetry. No JVD or bruits. Lungs are clear. Cardiovascular limits irregular no murmur. Abdomen soft nontender good bowel sounds. Left lower [name] he is wrapped with a Hemovac drain in place still nerve vascular intact left knee seems intact with no effusion. Right lower [name] he is normal. Neurologically he is nonlateralizing.   Lab Results:  [facility] 04/19/11 0613 04/18/11 0536  NA 129* 129*  K 5.1 5.1  CL 101 101  CO2 19 18*  GLUCOSE 101* 102*  BUN 57* 51*  CREATININE 1.74* 1.67*  CALCIUM 8.5 8.6  MG -- --  PHOS -- --   No results found for this basename: AST:2,ALT:2,ALKPHOS:2,BILITOT:2,PROT:2,ALBUMIN:2 in the last 72 hours  Basename 04/19/11 0613 04/18/11 0536  WBC 11.8* 16.0*  NEUTROABS -- --  HGB 10.1* 10.9*  HCT 29.6* 33.0*  MCV 90.2 90.9  PLT 372 347   Lab Results  Component Value Date   INR 1.52* 04/17/2011   INR 1.17 04/16/2011   INR 1.15 04/15/2011   No results found for this basename:  CKTOTAL:3,CKMB:3,CKMBINDEX:3,TROPONINI:3 in the last 72 hours No results found for this basename: TSH,T4TOTAL,FREET3,T3FREE,THYROIDAB in the last 72 hours No results found for this basename: VITAMINB12:2,FOLATE:2,FERRITIN:2,TIBC:2,IRON:2,RETICCTPCT:2 in the last 72 hours  Studies/Results: Ct Tibia Fibula Left Wo Contrast  04/18/2011  *RADIOLOGY REPORT*  Clinical Data: Leg redness.  Evaluate for abscess.  CT TIBIA FIBULA LEFT WITHOUT CONTRAST  Technique:  Contiguous noncontrast axial images were obtained of the left leg from both the knee to below the ankle.  Coronal and sagittal reconstructions were created in bone and soft tissue windows.  Comparison: 04/15/2011.  Findings: Please note that noncontrast technique gives suboptimal visualization of vascular/enhancement of fluid.  Within this limitation, there is pronounced infiltration of the dorsal subcutaneous tissues of the leg.  Additionally, in the posterior compartment, interposed between the medial head of the gastrocnemius and the soleus muscle, there is a focal fluid collection with a tiny locule of gas highly suspicious for multi locular abscess.  On axial image number 65, this measures 78 mm AP by 41 mm transverse.  Craniocaudal extent is 17 cm.  Medial leg varicosities are present with phleboliths.  Severe below-the-knee atherosclerosis.  The bones demonstrate osteolysis around the cemented Constrained knee arthroplasty which may represent chronic infection or particle disease.  Ordinary mechanical loosening is considered less likely.  This is most pronounced around the patellar component and femoral component.  There is also lucency around the bone cement in the tibial plateau.  IMPRESSION:  1.  Elongated medial posterior compartment fluid collection with gas locules suspicious for deep abscess interposed between the medial head gastrocnemius and the soleus muscle. 2.  Severe subcutaneous edema/fluid. 3.  Osteolysis around the total knee  arthroplasty, likely representing either chronic infection or particle disease, with mechanical loosening considered unlikely.  Original Report Authenticated By: Andreas Newport, M.D.     Assessment/Plan: #1 left lower extremity posterior compartment abscess day 1 status post incision and drainage with drain in place on antibiotics  #2 atrial fibrillation rate controlled currently not on anticoagulation because of #1 will need to be on Coumadin once this is settle down  #3 coronary artery disease status post coronary artery bypass grafting remote stable  #4 essential hypertension stable  #5 chronic kidney disease currently at his baseline  Plan antibiotics, fluids and extremity per orthopedic surgery   LOS: 5 days   Zyliah Schier A 04/20/2011, 1:18 PM

## 2011-04-20 NOTE — Progress Notes (Signed)
Subjective: Procedure(s) (LRB): IRRIGATION AND DEBRIDEMENT EXTREMITY (Left) 1 Day Post-Op  Patient reports pain as moderate.  Positive void Negative bowel movement Positive flatus Negative chest pain or shortness of breath  Objective: Vital signs in last 24 hours: Temp:  [97.4 F (36.3 C)-98.9 F (37.2 C)] 97.7 F (36.5 C) (03/31 0800) Pulse Rate:  [29-94] 94  (03/31 0730) Resp:  [13-22] 21  (03/31 0730) BP: (67-137)/(34-98) 91/73 mmHg (03/31 0730) SpO2:  [80 %-100 %] 99 % (03/31 0730) FiO2 (%):  [2 %] 2 % (03/31 0400)  Intake/Output from previous day: 03/30 0701 - 03/31 0700 In: 2813.2 [I.V.:2300.7; IV Piggyback:512.5] Out: 1400 [Urine:1400]   Basename 04/19/11 0613 04/18/11 0536  WBC 11.8* 16.0*  RBC 3.28* 3.63*  HCT 29.6* 33.0*  PLT 372 347    Basename 04/19/11 0613 04/18/11 0536  NA 129* 129*  K 5.1 5.1  CL 101 101  CO2 19 18*  BUN 57* 51*  CREATININE 1.74* 1.67*  GLUCOSE 101* 102*  CALCIUM 8.5 8.6   No results found for this basename: LABPT:2,INR:2 in the last 72 hours  ABD soft Sensation intact distally Intact pulses distally Dorsiflexion/Plantar flexion intact Incision: dressing C/D/I  Assessment/Plan: Patient stable  Continue mobilization with physical therapy Continue care   Dr. Shon Baton spoke to and evaluated the patient today and agrees with the plan   Gwinda Maine 04/20/2011, 9:05 AM

## 2011-04-21 ENCOUNTER — Other Ambulatory Visit: Payer: Self-pay

## 2011-04-21 ENCOUNTER — Encounter (HOSPITAL_COMMUNITY)
Admission: EM | Disposition: A | Discharge status: Home Health | Payer: Self-pay | Source: Home / Self Care | Attending: Internal Medicine

## 2011-04-21 ENCOUNTER — Encounter (HOSPITAL_COMMUNITY): Payer: Self-pay | Admitting: Specialist

## 2011-04-21 DIAGNOSIS — D62 Acute posthemorrhagic anemia: Secondary | ICD-10-CM

## 2011-04-21 DIAGNOSIS — K922 Gastrointestinal hemorrhage, unspecified: Secondary | ICD-10-CM

## 2011-04-21 DIAGNOSIS — K920 Hematemesis: Secondary | ICD-10-CM

## 2011-04-21 HISTORY — PX: ESOPHAGOGASTRODUODENOSCOPY: SHX5428

## 2011-04-21 LAB — BASIC METABOLIC PANEL
GFR calc Af Amer: 46 mL/min — ABNORMAL LOW (ref >90)
GFR calc non Af Amer: 40 mL/min — ABNORMAL LOW (ref >90)
Potassium: 5.1 mEq/L (ref 3.5–5.1)
Sodium: 134 mEq/L — ABNORMAL LOW (ref 135–145)

## 2011-04-21 LAB — CBC
Hemoglobin: 8.6 g/dL — ABNORMAL LOW (ref 13.0–17.0)
MCH: 29 pg (ref 26.0–34.0)
MCHC: 32.7 g/dL (ref 30.0–36.0)
Platelets: 426 10*3/uL — ABNORMAL HIGH (ref 150–400)
RBC: 2.88 MIL/uL — ABNORMAL LOW (ref 4.22–5.81)
RBC: 2.83 MIL/uL — ABNORMAL LOW (ref 4.22–5.81)
RDW: 17.2 % — ABNORMAL HIGH (ref 11.5–15.5)
WBC: 8.5 10*3/uL (ref 4.0–10.5)
WBC: 17.7 10*3/uL — ABNORMAL HIGH (ref 4.0–10.5)

## 2011-04-21 LAB — CULTURE, BLOOD (ROUTINE X 2): Culture  Setup Time: 201303262311

## 2011-04-21 LAB — PREPARE RBC (CROSSMATCH)

## 2011-04-21 LAB — PROTIME-INR: Prothrombin Time: 20 seconds — ABNORMAL HIGH (ref 11.6–15.2)

## 2011-04-21 SURGERY — EGD (ESOPHAGOGASTRODUODENOSCOPY)
Anesthesia: Moderate Sedation

## 2011-04-21 MED ORDER — METOCLOPRAMIDE HCL 5 MG/ML IJ SOLN
10.0000 mg | Freq: Four times a day (QID) | INTRAMUSCULAR | Status: AC
  Administered 2011-04-21 – 2011-04-22 (×4): 10 mg via INTRAVENOUS
  Filled 2011-04-21 (×5): qty 2

## 2011-04-21 MED ORDER — MIDAZOLAM HCL 10 MG/2ML IJ SOLN
INTRAMUSCULAR | Status: AC
  Filled 2011-04-21: qty 2

## 2011-04-21 MED ORDER — ATROPINE SULFATE 0.1 MG/ML IJ SOLN
INTRAMUSCULAR | Status: AC
  Filled 2011-04-21: qty 10

## 2011-04-21 MED ORDER — MIDAZOLAM HCL 10 MG/2ML IJ SOLN
INTRAMUSCULAR | Status: DC | PRN
  Administered 2011-04-21 (×2): 2 mg via INTRAVENOUS
  Administered 2011-04-22: 1 mg via INTRAVENOUS
  Administered 2011-04-22: 2 mg via INTRAVENOUS
  Administered 2011-04-22 (×2): 1 mg via INTRAVENOUS

## 2011-04-21 MED ORDER — ACETAMINOPHEN 650 MG RE SUPP
650.0000 mg | Freq: Once | RECTAL | Status: DC
  Filled 2011-04-21: qty 1

## 2011-04-21 MED ORDER — METOCLOPRAMIDE HCL 5 MG/ML IJ SOLN
10.0000 mg | Freq: Four times a day (QID) | INTRAMUSCULAR | Status: DC | PRN
  Administered 2011-04-21: 10 mg via INTRAVENOUS

## 2011-04-21 MED ORDER — ONDANSETRON HCL 4 MG/2ML IJ SOLN
INTRAMUSCULAR | Status: AC
  Filled 2011-04-21: qty 2

## 2011-04-21 MED ORDER — FENTANYL CITRATE 0.05 MG/ML IJ SOLN
INTRAMUSCULAR | Status: AC
  Filled 2011-04-21: qty 2

## 2011-04-21 MED ORDER — PANTOPRAZOLE SODIUM 40 MG IV SOLR
40.0000 mg | Freq: Once | INTRAVENOUS | Status: AC
  Administered 2011-04-21: 40 mg via INTRAVENOUS
  Filled 2011-04-21: qty 40

## 2011-04-21 MED ORDER — VITAMIN K1 10 MG/ML IJ SOLN
5.0000 mg | Freq: Once | INTRAMUSCULAR | Status: AC
  Administered 2011-04-21: 5 mg via SUBCUTANEOUS
  Filled 2011-04-21: qty 0.5

## 2011-04-21 MED ORDER — VANCOMYCIN HCL IN DEXTROSE 1-5 GM/200ML-% IV SOLN
1000.0000 mg | INTRAVENOUS | Status: DC
  Administered 2011-04-22 – 2011-04-23 (×2): 1000 mg via INTRAVENOUS
  Filled 2011-04-21 (×2): qty 200

## 2011-04-21 MED ORDER — PANTOPRAZOLE SODIUM 40 MG PO TBEC: 40.0000 mg | DELAYED_RELEASE_TABLET | Freq: Every day | ORAL | Status: DC

## 2011-04-21 MED ORDER — FENTANYL NICU IV SYRINGE 50 MCG/ML
INJECTION | INTRAMUSCULAR | Status: DC | PRN
  Administered 2011-04-21 (×2): 25 ug via INTRAVENOUS
  Administered 2011-04-22 (×3): 12.5 ug via INTRAVENOUS

## 2011-04-21 MED ORDER — PANTOPRAZOLE SODIUM 40 MG IV SOLR
8.0000 mg/h | INTRAVENOUS | Status: DC
  Administered 2011-04-21 – 2011-04-25 (×6): 8 mg/h via INTRAVENOUS
  Filled 2011-04-21 (×17): qty 80

## 2011-04-21 NOTE — Progress Notes (Signed)
Pt has episode of vomiting dark blood at 1200 along with episode of asymptomatic bradycardia that resolved within 15 seconds without intervention. MD notified. Order for protonix given. Pt had second episode of vomiting at 1300 along with bradycardia that resolved 15 seconds. MD notified. Orders for NG tube placement, low intermittent suction and GI consult initiated.  Will continue to monitor.  Geroge Baseman

## 2011-04-21 NOTE — Consult Note (Addendum)
Patient seen, examined, and I agree with the above documentation, including the assessment and plan. Here with LLE compartment abscess s/p I/D on abx. Also with afib on lovenox for anticoagulation, now with hematemesis and anemia (acute). Unclear why INR is 1.6 (I do not see that he has received warfarin to this point).  This increase is fairly acute and he does not have thrombocytopenia to suggest portal HTN.  No known hx of chronic liver dx.  AST/ALT mildly elevated, unknown reason to this point. Getting 2 units pRBC now. Will plan EGD at bedside. THe nature of the procedure, as well as the risks, benefits, and alternatives were carefully and thoroughly reviewed with the patient. Ample time for discussion and questions allowed. The patient understood, was satisfied, and agreed to proceed.   Addendum: Patient completed 3 units of pRBC overnight.  He has continued to have bloody outpt from NGT and now melenic stools.  Hgb is 9.0.  INR 1.55 and he did receive [place] Vit K yesterday evening. He remains on PPI gtt. Hemodynamics are stable. Will plan repeat EGD looking for source of bleeding. Pt consented, and I spoke with wife by phone. The nature of the procedure, as well as the risks, benefits, and alternatives were carefully and thoroughly reviewed with the patient. Ample time for discussion and questions allowed. The patient understood, was satisfied, and agreed to proceed.

## 2011-04-21 NOTE — Progress Notes (Signed)
ANTIBIOTIC CONSULT NOTE - INITIAL  Pharmacy Consult for Vancomycin, Zosyn Indication: LLE abscess, s/p I&D 3/30  Allergies  Allergen Reactions  . Biaxin Rash    Patient Measurements: Height: 5\' 10"  (177.8 cm) Weight: 262 lb 5.6 oz (119 kg) IBW/kg (Calculated) : 73    Vital Signs: Temp: 98.7 F (37.1 C) (04/01 1200) Temp src: Oral (04/01 1200) BP: 111/63 mmHg (04/01 1230) Pulse Rate: 80  (04/01 1230) Intake/Output from previous day: 03/31 0701 - 04/01 0700 In: 2492.5 [P.O.:620; I.V.:1485; IV Piggyback:387.5] Out: 2625 [Urine:1825; Drains:800] Intake/Output from this shift: Total I/O In: 487.5 [I.V.:225; IV Piggyback:262.5] Out: 850 [Urine:850]  Labs:  [facility] 04/21/11 0450 04/20/11 1745 04/19/11 0613  WBC 8.5 11.1* 11.8*  HGB 8.6* 7.7* 10.1*  PLT 472* 440* 372  LABCREA -- -- --  CREATININE 1.64* 1.73* 1.74*   Estimated Creatinine Clearance: 51.9 ml/min (by C-G formula based on Cr of 1.64). Normalized Creatinine Clearance: 53ml/min/72kg    Microbiology: Recent Results (from the past 720 hour(s))  CULTURE, BLOOD (ROUTINE X 2)     Status: Normal   Collection Time   04/15/11 12:30 PM      Component Value Range Status Comment   Specimen Description BLOOD RIGHT ARM   Final    Special Requests BOTTLES DRAWN AEROBIC ONLY 3CC   Final    Culture  Setup Time [id number]   Final    Culture NO GROWTH 5 DAYS   Final    Report Status 04/21/2011 FINAL   Final   BODY FLUID CULTURE     Status: Normal (Preliminary result)   Collection Time   04/19/11  9:47 PM      Component Value Range Status Comment   Specimen Description SYNOVIAL LEFT INTRAARTICULAR TOTAL KNEE FLUID   Final    Special Requests NONE   Final    Gram Stain     Final    Value: WBC PRESENT, PREDOMINANTLY PMN     NO ORGANISMS SEEN   Culture NO GROWTH   Final    Report Status PENDING   Incomplete   ANAEROBIC CULTURE     Status: Normal (Preliminary result)   Collection Time   04/19/11  9:49 PM      Component  Value Range Status Comment   Specimen Description SYNOVIAL LEFT INTRAARTICULAR TOTAL KNEE FLUID   Final    Special Requests NONE   Final    Gram Stain     Final    Value: WBC PRESENT, PREDOMINANTLY PMN     NO ORGANISMS SEEN   Culture     Final    Value: NO ANAEROBES ISOLATED; CULTURE IN PROGRESS FOR 5 DAYS   Report Status PENDING   Incomplete   CULTURE, ROUTINE-ABSCESS     Status: Normal (Preliminary result)   Collection Time   04/19/11  9:50 PM      Component Value Range Status Comment   Specimen Description LEG LEFT CALF ABCESS   Final    Special Requests NONE   Final    Gram Stain     Final    Value: MODERATE WBC PRESENT, PREDOMINANTLY PMN     NO SQUAMOUS EPITHELIAL CELLS SEEN     NO ORGANISMS SEEN   Culture NO GROWTH 1 DAY   Final    Report Status PENDING   Incomplete   ANAEROBIC CULTURE     Status: Normal (Preliminary result)   Collection Time   04/19/11  9:52 PM      Component Value  Range Status Comment   Specimen Description LEG LEFT CALF ABCESS   Final    Special Requests NONE   Final    Gram Stain     Final    Value: FEW WBC PRESENT, PREDOMINANTLY PMN     NO SQUAMOUS EPITHELIAL CELLS SEEN     NO ORGANISMS SEEN   Culture     Final    Value: NO ANAEROBES ISOLATED; CULTURE IN PROGRESS FOR 5 DAYS   Report Status PENDING   Incomplete     Medical History: Past Medical History  Diagnosis Date  . Hypertension   . Hypercholesteremia   . Anxiety     Medications:  Scheduled:     . ALPRAZolam  0.25 mg Oral Daily  . aspirin  81 mg Oral Daily  . atorvastatin  40 mg Oral Daily  . atropine      . enoxaparin (LOVENOX) injection  30 mg Subcutaneous Q24H  . isosorbide mononitrate  60 mg Oral Daily  . lisinopril  10 mg Oral Daily  . pantoprazole  40 mg Oral Q1200  . pantoprazole (PROTONIX) IV  40 mg Intravenous Once  . perflutren lipid microspheres  10 microL/kg Intravenous Once  . piperacillin-tazobactam (ZOSYN)  IV  3.375 g Intravenous Q8H  . vancomycin  1,250 mg  Intravenous Q24H   Infusions:     . sodium chloride 75 mL/hr at 04/21/11 1111  . phenylephrine (NEO-SYNEPHRINE) Adult infusion Stopped (04/21/11 0358)  . DISCONTD: sodium chloride     PRN: acetaminophen, acetaminophen, ALPRAZolam, bisacodyl, ondansetron (ZOFRAN) IV, ondansetron, oxyCODONE-acetaminophen, polyethylene glycol, traMADol, zolpidem, DISCONTD: 0.9 % irrigation (POUR BTL), DISCONTD: sodium chloride irrigation  Assessment:  76 year old M with LLE abscess, r/o infected TKA.  S/P I&D abscess and aspiration L knee 3/30.  Day #4 Vancomycin 1250mg  IV q24h / Zosyn 3.375 grams IV q8h (extended-infusion)  SCr elevated but stable  Vancomycin trough therapeutic, but anticipate possible further accumulation of drug in setting of renal insufficiency  Goal of Therapy:   Vancomycin trough 15-20  Zosyn adjusted to renal function  Plan:   Reduce Vancomycin to 1000mg  IV q24h - next dose tomorrow.  Continue Zosyn 3.375 grams IV q8h (extended-infusion, each dose over 4 hours).  Follow serum creatinine.  Await culture results  Elie Goody, PharmD, BCPS Pager: [phone] 04/21/2011  1:04 PM

## 2011-04-21 NOTE — Progress Notes (Signed)
Subjective: Dressing changed and drain Dcd since it is no longer draining. Wound looks fine . Knee is slightly swollen but no erythema is present. Thus far ,ALL cultures have shown No Growth,including Blood C&S and knee and wound cultures. At present time he has an upper GI Bleed and is being transfused.He is afebrile and WBC is normal.I did call his wife and discussed his case with her and informed her that I just returned from vacation and did speak with Dr. Thomasena Edis concerning the surgery,and I will resume his care.His renal functions have been decreasing.   Objective: Vital signs in last 24 hours: Temp:  [98.1 F (36.7 C)-98.8 F (37.1 C)] 98.1 F (36.7 C) (04/01 1810) Pulse Rate:  [56-89] 82  (04/01 1810) Resp:  [15-25] 19  (04/01 1810) BP: (79-140)/(45-79) 116/75 mmHg (04/01 1810) SpO2:  [92 %-100 %] 100 % (04/01 1800) FiO2 (%):  [2 %] 2 % (04/01 1400) Weight:  [118.842 kg (262 lb)] 118.842 kg (262 lb) (04/01 1629)  Intake/Output from previous day: 03/31 0701 - 04/01 0700 In: 2492.5 [P.O.:620; I.V.:1485; IV Piggyback:387.5] Out: 2625 [Urine:1825; Drains:800] Intake/Output this shift: Total I/O In: 762.5 [I.V.:425; Blood:25; IV Piggyback:312.5] Out: 1550 [Urine:1350; Emesis/NG output:200]   Basename 04/21/11 1400 04/21/11 0450 04/20/11 1745 04/19/11 0613  HGB 7.4* 8.6* 7.7* 10.1*    Basename 04/21/11 1400 04/21/11 0450 04/20/11 1745  WBC -- 8.5 11.1*  RBC -- 2.88* 2.53*  HCT 22.1* 26.3* --  PLT -- 472* 440*    Basename 04/21/11 0450 04/20/11 1745  NA 134* 131*  K 5.1 5.0  CL 105 104  CO2 21 20  BUN 43* 50*  CREATININE 1.64* 1.73*  GLUCOSE 97 122*  CALCIUM 8.7 8.2*    Basename 04/21/11 1400  LABPT --  INR 1.67*    Drain was Dcd and his wound looks fine.  Assessment/Plan: Will follow daily.   Felisha Claytor A 04/21/2011, 6:37 PM

## 2011-04-21 NOTE — Progress Notes (Signed)
Subjective: Interesting that Doppler on admission did not show any signs of an abscess. In reviewing his records I believe that when we started his  IV Antibiotics that this resulted in a localization of the Fluid collection,which thus far is Sterile. I believe that he may be harboring a low grade infection in hi Total Knee. This is a revision Knee and would be very difficult to remove. If this does end up being a Low Grade Infection,taking into consideration his present GI Bleed,it may be safer to treat with long term antibiotics. Once again all Cultures have shown  No Growth, but we will follow carefully.   Objective: Vital signs in last 24 hours: Temp:  [98.1 F (36.7 C)-98.8 F (37.1 C)] 98.6 F (37 C) (04/01 1825) Pulse Rate:  [56-89] 82  (04/01 1810) Resp:  [15-25] 19  (04/01 1810) BP: (79-140)/(45-79) 94/54 mmHg (04/01 1825) SpO2:  [92 %-100 %] 100 % (04/01 1800) FiO2 (%):  [2 %] 2 % (04/01 1400) Weight:  [118.842 kg (262 lb)] 118.842 kg (262 lb) (04/01 1629)  Intake/Output from previous day: 03/31 0701 - 04/01 0700 In: 2492.5 [P.O.:620; I.V.:1485; IV Piggyback:387.5] Out: 2625 [Urine:1825; Drains:800] Intake/Output this shift: Total I/O In: 762.5 [I.V.:425; Blood:25; IV Piggyback:312.5] Out: 1550 [Urine:1350; Emesis/NG output:200]   Basename 04/21/11 1400 04/21/11 0450 04/20/11 1745 04/19/11 0613  HGB 7.4* 8.6* 7.7* 10.1*    Basename 04/21/11 1400 04/21/11 0450 04/20/11 1745  WBC -- 8.5 11.1*  RBC -- 2.88* 2.53*  HCT 22.1* 26.3* --  PLT -- 472* 440*    Basename 04/21/11 0450 04/20/11 1745  NA 134* 131*  K 5.1 5.0  CL 105 104  CO2 21 20  BUN 43* 50*  CREATININE 1.64* 1.73*  GLUCOSE 97 122*  CALCIUM 8.7 8.2*    Basename 04/21/11 1400  LABPT --  INR 1.67*    Noted in my initial note.  Assessment/Plan: Will follow cultures in A.M.   Clee Pandit A 04/21/2011, 6:58 PM

## 2011-04-21 NOTE — Consult Note (Addendum)
Admit date: 04/15/2011   Referring Physician  Rodrigo Ran M.D.  Primary Physician  Dr Jacky Kindle  Primary Cardiologist  Dr. Donnie Aho  Reason for Consultation:  Bradycardia  ASSESSMENT: 1. Transient episodes of bradycardia/high-grade A-V block probably related to increased vagal tone during nausea and vomiting.  2.Chronic atrial fibrillation  3. Chronic kidney disease  4. Hypertension  5. Coronary artery disease with prior coronary bypass surgery  PLAN:  1. No specific intervention at this time. Bradycardia will probably resolve once nausea vomiting resolve.  2. Avoid medications that could cause AV conduction delay (digoxin, non-dihydropyridine calcium channel blockers, and beta blockers).  3. Watch for hyperkalemia   HPI: Dr. Waynard Edwards called early this evening and asked that I become aware of this patient who has had 2 episodes of sudden severe bradycardia in relationship to nausea and vomiting. I have reviewed the patient's records. His vital signs are currently stable. He is on no medications I can disturb AV conduction   PMH:   Past Medical History  Diagnosis Date  . Hypertension   . Hypercholesteremia   . Anxiety   . Obesity (BMI 35.0-39.9 without comorbidity)      PSH:   Past Surgical History  Procedure Date  . Coronary artery bypass graft   . Joint replacement   . Foot surgery   . Leg surgery   . Appendectomy   . I&d extremity 04/19/2011    Procedure: IRRIGATION AND DEBRIDEMENT EXTREMITY;  Surgeon: Eugenia Mcalpine, MD;  Location: [facility];  Service: Orthopedics;  Laterality: Left;  aspiration of left total knee fluid and irrigation and debridement of left calf abscess    Allergies:  Biaxin Prior to Admit Meds:   Prescriptions prior to admission  Medication Sig Dispense Refill  . ALPRAZolam (XANAX) 0.25 MG tablet Take 0.25 mg by mouth daily.      [name] atorvastatin (LIPITOR) 40 MG tablet Take 40 mg by mouth daily.      [name] doxycycline (VIBRA-TABS) 100 MG tablet Take  100 mg by mouth 2 (two) times daily. He is taking for 14 days. He started on 04/08/11 and has not completed.      . isosorbide mononitrate (IMDUR) 60 MG 24 hr tablet Take 60 mg by mouth daily.      [name] lisinopril (PRINIVIL,ZESTRIL) 10 MG tablet Take 10 mg by mouth daily.      . meloxicam (MOBIC) 15 MG tablet Take 15 mg by mouth daily as needed. For pain.      . traMADol (ULTRAM) 50 MG tablet Take 50 mg by mouth every 4 (four) hours as needed. For pain.      [name] DISCONTD: aspirin EC 81 MG tablet Take 81 mg by mouth daily.      [name] DISCONTD: oxyCODONE-acetaminophen (PERCOCET) 5-325 MG per tablet Take 1 tablet by mouth every 6 (six) hours as needed. For pain.       Fam HX:   History reviewed. No pertinent family history. Social HX:    History   Social History  . Marital Status: Married    Spouse Name: N/A    Number of Children: N/A  . Years of Education: N/A   Occupational History  . Not on file.   Social History Main Topics  . Smoking status: Never Smoker   . Smokeless tobacco: Never Used  . Alcohol Use: No  . Drug Use: No  . Sexually Active: No   Other Topics Concern  . Not on file   Social History Narrative  .  No narrative on file     Review of Systems: Chart reviewed.  Physical Exam: Blood pressure 142/62, pulse 82, temperature 98.5 F (36.9 C), temperature source Oral, resp. rate 19, height 5\' 10"  (1.778 m), weight 118.842 kg (262 lb), SpO2 100.00%. Weight change:   NG tube in place.  Irregular heart rhythm. No murmur or gallop.  Neurologically intact.  No abdominal tenderness Labs:   Lab Results  Component Value Date   WBC 8.5 04/21/2011   HGB 7.4* 04/21/2011   HCT 22.1* 04/21/2011   MCV 91.3 04/21/2011   PLT 472* 04/21/2011    Lab 04/21/11 0450 04/16/11 0638  NA 134* --  K 5.1 --  CL 105 --  CO2 21 --  BUN 43* --  CREATININE 1.64* --  CALCIUM 8.7 --  PROT -- 6.7  BILITOT -- 0.6  ALKPHOS -- 178*  ALT -- 58*  AST -- 60*  GLUCOSE 97 --   No results found for  this basename: PTT   Lab Results  Component Value Date   INR 1.67* 04/21/2011   INR 1.52* 04/17/2011   INR 1.17 04/16/2011   Lab Results  Component Value Date   CKTOTAL 25 04/16/2011   CKMB 1.6 04/16/2011   TROPONINI <0.30 04/16/2011     No results found for this basename: CHOL   No results found for this basename: HDL   No results found for this basename: LDLCALC   No results found for this basename: TRIG   No results found for this basename: CHOLHDL   No results found for this basename: LDLDIRECT      Radiology:  Dg Chest Port 1 View  04/20/2011  *RADIOLOGY REPORT*  Clinical Data: Line placement.  PORTABLE CHEST - 1 VIEW  Comparison: 04/15/2011.  Findings: There is a new right upper extremity PICC.  The tip is poorly visualized but appears to extend to at least the mid SVC. The position of the tip is not definitely visualized.  The lung volumes remain low with pulmonary vascular congestion and basilar atelectasis.  Cardiomegaly remains present.  IMPRESSION: New right upper extremity PICC.  The tip is not well visualized due to overlapping structures.  The tip is at least in the mid SVC.  A repeat PA chest radiograph with full inspiration may better demonstrate the position of the PICC line tip.  Radiology department radiographs are typically better than portable radiographs.  Otherwise unchanged appearance of the chest.  Original Report Authenticated By: Andreas Newport, M.D.   EKG:  Atrial fibrillation with controlled ventricular rate. No acute ST-T wave changes are noted.    Lesleigh Noe 04/21/2011 8:45 PM

## 2011-04-21 NOTE — Progress Notes (Signed)
Physical therapy note. PT orders were discontinued. Will need reordered for PT to continue. Please reorder if indicated.thank you

## 2011-04-21 NOTE — Op Note (Signed)
[facility] [address] [place], [place]  [address]  ENDOSCOPY PROCEDURE REPORT  PATIENT:  Joe [name], Joe [name]  MR#:  [id number] BIRTHDATE:  [birth year], 75 yrs. old  GENDER:  male ENDOSCOPIST:  Carie Caddy. Lucella Pommier, MD Referred by:  Geoffry Paradise, M.D. PROCEDURE DATE:  04/21/2011 PROCEDURE:  EGD, diagnostic 43235 ASA CLASS:  Class III INDICATIONS:  hematemesis, anemia MEDICATIONS:   Fentanyl 50 mcg IV, Versed 5 mg IV TOPICAL ANESTHETIC:  none  DESCRIPTION OF PROCEDURE:   After the risks benefits and alternatives of the procedure were thoroughly explained, informed consent was obtained.  The Pentax Gastroscope [id number] endoscope was introduced through the mouth and advanced to the bulb of duodenum, limited by blood and clots.   The instrument was slowly withdrawn as the mucosa was fully examined. <<PROCEDUREIMAGES>>  The esophagus and gastroesophageal junction were completely normal in appearance.  A hiatal hernia was found.  A large blood clot was found in the cardia/fundus and from the pylorus extending onto the incisura.  Also large blood clot was found in the bulb of the duodenum.  There appeared to be fresh blood but bleeding source could not be identified due to the presence of the large clots. Retroflexed views revealed findings as previously described.  While trying to clear the stomach of blood and clots the patient developed transient bradycardia, but did not drop blood pressure or lose consciousness.   The scope was then withdrawn from the patient and the procedure completed.  COMPLICATIONS:  Transient bradycardia  ENDOSCOPIC IMPRESSION: 1) Normal esophagus 2) Hiatal hernia 3) Blood clot in the cardia/fundus, pylorus, and incisura obscuring view.  No active bleeding spot/source was found. 4) Blood and large clot in the bulb of duodenum obscuring view. No active bleeding spot/source was found.  RECOMMENDATIONS: 1) Continue PPI infusion 2) Hold lovenox 3)  Complete 2 units of pRBC as previously ordered and closely follow Hgb/HCT.  Additional transfusion may be required. 4) Reglan 10 mg IV q6 hours 5) Plan to repeat EGD tomorrow (hopefully clot will migrate and allow for better visualization). 6) Consider cardiology consult re: bradycardia.  Carie Caddy. Rhea Belton, MD  CC:  Jacky Kindle, MD.  n. eSIGNED:   Carie Caddy. Nyeli Holtmeyer at 04/21/2011 05:43 PM  Harrington Challenger, [phone]

## 2011-04-21 NOTE — Progress Notes (Signed)
Subjective: Pt feeling ok, sore left leg.   Objective: Vital signs in last 24 hours: Temp:  [97.7 F (36.5 C)-98.6 F (37 C)] 98.6 F (37 C) (04/01 0400) Pulse Rate:  [56-94] 76  (04/01 0700) Resp:  [15-24] 15  (04/01 0700) BP: (75-128)/(28-75) 115/50 mmHg (04/01 0700) SpO2:  [95 %-100 %] 99 % (04/01 0700)  Intake/Output from previous day: 03/31 0701 - 04/01 0700 In: 2405 [P.O.:620; I.V.:1410; IV Piggyback:375] Out: 2625 [Urine:1825; Drains:800] Intake/Output this shift:     Basename 04/21/11 0450 04/20/11 1745 04/19/11 0613  HGB 8.6* 7.7* 10.1*    Basename 04/21/11 0450 04/20/11 1745  WBC 8.5 11.1*  RBC 2.88* 2.53*  HCT 26.3* 23.0*  PLT 472* 440*    Basename 04/21/11 0450 04/20/11 1745  NA 134* 131*  K 5.1 5.0  CL 105 104  CO2 21 20  BUN 43* 50*  CREATININE 1.64* 1.73*  GLUCOSE 97 122*  CALCIUM 8.7 8.2*   No results found for this basename: LABPT:2,INR:2 in the last 72 hours  Pt cons alert appropriate, no distress. Left leg dressing intact, toes NMVI. Hemavac inplace  And charged, left knee less tender, minimal effusion, not hot.  Assessment/Plan: POD # 2 S/P I+D left calf abscess with drain secured, doing well. Aspiration of left TKA shows 19k WBC, cultures no organisms. Plan will discuss with Dr Darrelyn Hillock to resume treatment.  Joe [name] 04/21/2011, 7:12 AM

## 2011-04-21 NOTE — Consult Note (Signed)
[facility] Consult: 3:20 PM 04/21/2011   Referring Provider: Jacky Kindle Primary Care Physician:  Jacky Kindle Primary Gastroenterologist:  No GI MD found in records.   Reason for Consultation:  GI bleed  HPI: Joe [name] is a 76 y.o. male.  Admitted 3/26 with n/v/body aches, urinary frequency, throbbing left leg pain.  Had been scheduled for surgical removal of femoral pin by Dr Darrelyn Hillock. WBCs 20K.  Diagnosed with left LE cellulitis, abcess, left knee arthropasty infection.  04/19/11 underwent I & D of calf abcess, insertion of drain.   Has rate controlled A Fib,  Appears to be a new problem as not listed in "past medical history".  Currently receiving Lovenox as of 04/16/11.  .  Following surgery the Hgb dropped from 10.1 to 7.7.  It is 8.6 this AM.  Was 13.7 on admission last week.    2 units of PRBCs ordered.  Noon today had small volume bloody emesis, "spit UP" per RN, did this a second time within the hour, 3rd episode was 300 cc of pure blood.  NGT placed and suctioned. 600 cc in canister so far.   Still feels nauseated.  No belly pain.   Episodes of bradycardia preceding and along with episodes of bleeding with rates of mid 20's.  SBPs in low 100s to 140.    No prior hx of UGIB, ulcers. Takes Mobic once daily and 81 Mg ASA.  Last BM was 2 days ago, norm is once daily.     Past Medical History  Diagnosis Date  . Hypertension   . Hypercholesteremia   . Anxiety     Past Surgical History  Procedure Date  . Coronary artery bypass graft   . Joint replacement   . Foot surgery   . Leg surgery   . Appendectomy   . I&d extremity 04/19/2011    Procedure: IRRIGATION AND DEBRIDEMENT EXTREMITY;  Surgeon: Eugenia Mcalpine, MD;  Location: [facility];  Service: Orthopedics;  Laterality: Left;  aspiration of left total knee fluid and irrigation and debridement of left calf abscess    Prior to Admission medications   Medication Sig Start Date End Date Taking?  Authorizing Provider  ALPRAZolam (XANAX) 0.25 MG tablet Take 0.25 mg by mouth daily.   Yes Historical Provider, MD  atorvastatin (LIPITOR) 40 MG tablet Take 40 mg by mouth daily.   Yes Historical Provider, MD  doxycycline (VIBRA-TABS) 100 MG tablet Take 100 mg by mouth 2 (two) times daily. He is taking for 14 days. He started on 04/08/11 and has not completed.   Yes Historical Provider, MD  isosorbide mononitrate (IMDUR) 60 MG 24 hr tablet Take 60 mg by mouth daily.   Yes Historical Provider, MD  lisinopril (PRINIVIL,ZESTRIL) 10 MG tablet Take 10 mg by mouth daily.   Yes Historical Provider, MD  meloxicam (MOBIC) 15 MG tablet Take 15 mg by mouth daily as needed. For pain.   Yes Historical Provider, MD  traMADol (ULTRAM) 50 MG tablet Take 50 mg by mouth every 4 (four) hours as needed. For pain.   Yes Historical Provider, MD  oxyCODONE-acetaminophen (PERCOCET) 5-325 MG per tablet Take 1 tablet by mouth every 6 (six) hours as needed. 04/16/11 04/26/11  Amber Tamala Ser, PA    Scheduled Meds:    . acetaminophen  650 mg Rectal Once  . ALPRAZolam  0.25 mg Oral Daily  . atorvastatin  40 mg Oral Daily  . atropine      . isosorbide mononitrate  60 mg  Oral Daily  . lisinopril  10 mg Oral Daily  . ondansetron      . pantoprazole  40 mg Oral Q1200  . pantoprazole (PROTONIX) IV  40 mg Intravenous Once  . perflutren lipid microspheres  10 microL/kg Intravenous Once  . piperacillin-tazobactam (ZOSYN)  IV  3.375 g Intravenous Q8H  . vancomycin  1,000 mg Intravenous Q24H  . DISCONTD: aspirin  81 mg Oral Daily  . DISCONTD: enoxaparin (LOVENOX) injection  30 mg Subcutaneous Q24H  . DISCONTD: vancomycin  1,250 mg Intravenous Q24H   Infusions:    . sodium chloride 75 mL/hr at 04/21/11 1111  . phenylephrine (NEO-SYNEPHRINE) Adult infusion Stopped (04/21/11 0358)  . DISCONTD: sodium chloride     PRN Meds: acetaminophen, acetaminophen, ALPRAZolam, bisacodyl, metoCLOPramide (REGLAN) injection,  ondansetron (ZOFRAN) IV, ondansetron, oxyCODONE-acetaminophen, polyethylene glycol, traMADol, zolpidem, DISCONTD: 0.9 % irrigation (POUR BTL), DISCONTD: sodium chloride irrigation   Allergies as of 04/15/2011 - Review Complete 04/15/2011  Allergen Reaction Noted  . Biaxin Rash 04/11/2011    History reviewed. No pertinent family history.  History   Social History  . Marital Status: Married    Spouse Name: N/A    Number of Children: N/A  . Years of Education: N/A   Occupational History  . Not on file.   Social History Main Topics  . Smoking status: Never Smoker   . Smokeless tobacco: Never Used  . Alcohol Use: No  . Drug Use: No  . Sexually Active: No   Other Topics Concern  . Not on file   Social History Narrative  . No narrative on file    REVIEW OF SYSTEMS: Constitutional:  No weight loss ENT:  No nose bleeds Pulm:  No SOB or cough CV:  No chest pain.  Some LE edema GU:  No hematuria GI:  No dysphagia, no prior bleeding Heme:  No transfusions in past.    Transfusions:  above Neuro:  No headaches,  Feels abit dizzy Derm:  No rash or sores Endocrine:  No hx of diabetes. Immunization:  unknown Travel:  none   PHYSICAL EXAM: Vital signs in last 24 hours: Temp:  [98.3 F (36.8 C)-98.7 F (37.1 C)] 98.7 F (37.1 C) (04/01 1200) Pulse Rate:  [56-85] 77  (04/01 1500) Resp:  [15-23] 21  (04/01 1500) BP: (86-140)/(45-79) 103/54 mmHg (04/01 1430) SpO2:  [96 %-100 %] 100 % (04/01 1500) FiO2 (%):  [2 %] 2 % (04/01 1400)  General: pale, morbidly obese WM.  Looks unwell Head:  No asssymetry or signs of trauma  Eyes:  Pale conjunctiva Ears:  Not HOH  Nose:  No nasal bleeding.  NGT with burgundy blood Mouth:  Some bloody staining of oral MM Neck:  No JVD or masses Lungs:  No wheezes, rales.  No dyspnea.  Heart: Irregular, current rate in 70 Abdomen:  Soft, obese, tender in left UQ, no mass, no bruits..   Rectal: deferred   Musc/Skeltl: left leg  painful Extremities:  Edema marked in left LE which is bandaged. Minor pitting edema in right foot.   Neurologic:  Oriented x 3.  Skin:  No rash or itching.  Tattoos:  None seen Nodes:  None at neck or groin   Psych:  Pleasant, not anxious.  Intake/Output from previous day: 03/31 0701 - 04/01 0700 In: 2492.5 [P.O.:620; I.V.:1485; IV Piggyback:387.5] Out: 2625 [Urine:1825; Drains:800] Intake/Output this shift: Total I/O In: 587.5 [I.V.:300; IV Piggyback:287.5] Out: 1250 [Urine:1050; Emesis/NG output:200]  LAB RESULTS:   Ref. Range  04/15/2011 09:45 04/16/2011 06:38 04/17/2011 05:28 04/18/2011 05:36 04/19/2011 06:13 04/20/2011 17:45 04/21/2011 04:50  WBC Latest Range: 4.0-10.5 K/uL 20.0 (H) 21.7 (H) 14.8 (H) 16.0 (H) 11.8 (H) 11.1 (H) 8.5  RBC Latest Range: 4.22-5.81 MIL/uL 4.56 4.33 3.82 (L) 3.63 (L) 3.28 (L) 2.53 (L) 2.88 (L)  HGB Latest Range: 13.0-17.0 g/dL 40.9 81.1 91.4 (L) 78.2 (L) 10.1 (L) 7.7 (L) 8.6 (L)  HCT Latest Range: 39.0-52.0 % 40.3 38.2 (L) 34.3 (L) 33.0 (L) 29.6 (L) 23.0 (L) 26.3 (L)  MCV Latest Range: 78.0-100.0 fL 88.4 88.2 89.8 90.9 90.2 90.9 91.3  MCH Latest Range: 26.0-34.0 pg 30.0 30.3 30.1 30.0 30.8 30.4 29.9  MCHC Latest Range: 30.0-36.0 g/dL 95.6 21.3 08.6 57.8 46.9 33.5 32.7  RDW Latest Range: 11.5-15.5 % 14.7 14.5 14.6 14.4 14.5 14.5 14.5  Platelets Latest Range: 150-400 K/uL 256 279 297 347 372 440 (H) 472 (H)    BMET Lab Results  Component Value Date   NA 134* 04/21/2011   NA 131* 04/20/2011   NA 129* 04/19/2011   K 5.1 04/21/2011   K 5.0 04/20/2011   K 5.1 04/19/2011   CL 105 04/21/2011   CL 104 04/20/2011   CL 101 04/19/2011   CO2 21 04/21/2011   CO2 20 04/20/2011   CO2 19 04/19/2011   GLUCOSE 97 04/21/2011   GLUCOSE 122* 04/20/2011   GLUCOSE 101* 04/19/2011   BUN 43* 04/21/2011   BUN 50* 04/20/2011   BUN 57* 04/19/2011   CREATININE 1.64* 04/21/2011   CREATININE 1.73* 04/20/2011   CREATININE 1.74* 04/19/2011   CALCIUM 8.7 04/21/2011   CALCIUM 8.2* 04/20/2011   CALCIUM  8.5 04/19/2011   LFT No results found for this basename: PROT:3,ALBUMIN:3,AST:3,ALT:3,ALKPHOS:3,BILITOT:3,BILIDIR:3,IBILI:3 in the last 72 hours PT/INR Lab Results  Component Value Date   INR 1.67* 04/21/2011   INR 1.52* 04/17/2011   INR 1.17 04/16/2011     RADIOLOGY STUDIES: Dg Chest Port 1 View  04/20/2011  *RADIOLOGY REPORT*  Clinical Data: Line placement.  PORTABLE CHEST - 1 VIEW  Comparison: 04/15/2011.  Findings: There is a new right upper extremity PICC.  The tip is poorly visualized but appears to extend to at least the mid SVC. The position of the tip is not definitely visualized.  The lung volumes remain low with pulmonary vascular congestion and basilar atelectasis.  Cardiomegaly remains present.  IMPRESSION: New right upper extremity PICC.  The tip is not well visualized due to overlapping structures.  The tip is at least in the mid SVC.  A repeat PA chest radiograph with full inspiration may better demonstrate the position of the PICC line tip.  Radiology department radiographs are typically better than portable radiographs.  Otherwise unchanged appearance of the chest.  Original Report Authenticated By: Andreas Newport, M.D.    ENDOSCOPIC STUDIES: Pt says Dr Jacky Kindle did his colonoscopies but he is not sure.  Nothing in Epic or E chart.   IMPRESSION: 1.  UGI bleed.  R/O NSAID induced ulcer, r/o Mallory-Weiss tear.  Less likely is esophageal varices.  2.  Left calf abcess.  S/P I & D.   3.  Anemia.  To get two units PRBCs today. 4.  New onset Atrial Fib.  Last received Lovenox 8 AM today. 5.  Coagulopathy.  LOvenox should not drive increase in PT/INR. 6.  Elevated alk phos and AST/ALT 6 days ago.  May be from sepsis.  Need to recheck these.   Ultrasound in 05/2009 showed possible fatty liver.  PLAN: 1.  EGD today. Dr. Rhea Belton seeing pt now 2.  Would get critical care on board.  RN to discuss with Dr Waynard Edwards.    LOS: 6 days   Jennye Moccasin  04/21/2011, 3:20 PM Pager:  [phone]

## 2011-04-21 NOTE — Progress Notes (Signed)
Subjective: Joe [name] has developed acute upper gi bleeding. He required NG and Gi consult. He has been scoped and found to have a lot of clots in the stomach.  He denies pain. He has had some transient episodes of bradycardia (twice when he vomited blood and once during the endoscopy).  He denies pain currently.  Objective: Vital signs in last 24 hours: Temp:  [98.3 F (36.8 C)-98.8 F (37.1 C)] 98.8 F (37.1 C) (04/01 1629) Pulse Rate:  [56-89] 79  (04/01 1725) Resp:  [15-25] 23  (04/01 1725) BP: (79-140)/(45-79) 120/68 mmHg (04/01 1725) SpO2:  [92 %-100 %] 98 % (04/01 1725) FiO2 (%):  [2 %] 2 % (04/01 1400) Weight:  [161.096 kg (262 lb)] 118.842 kg (262 lb) (04/01 1629) Weight change:  Last BM Date: 04/19/11  Intake/Output from previous day: 03/31 0701 - 04/01 0700 In: 2492.5 [P.O.:620; I.V.:1485; IV Piggyback:387.5] Out: 2625 [Urine:1825; Drains:800] Intake/Output this shift: Total I/O In: 750 [I.V.:425; Blood:12.5; IV Piggyback:312.5] Out: 1550 [Urine:1350; Emesis/NG output:200]  General appearance: alert, cooperative and appears stated age Resp: clear to auscultation bilaterally anterolaterally Cardio: irregularly irregular rhythm, no sig. M/r/g GI: soft, non-tender; bowel sounds normal; no masses,  no organomegaly Extremities: extremities normal, atraumatic, no cyanosis or edema Neurologic: Grossly normal   Lab Results:  Basename 04/21/11 1400 04/21/11 0450 04/20/11 1745  WBC -- 8.5 11.1*  HGB 7.4* 8.6* --  HCT 22.1* 26.3* --  PLT -- 472* 440*   BMET  Basename 04/21/11 0450 04/20/11 1745  NA 134* 131*  K 5.1 5.0  CL 105 104  CO2 21 20  GLUCOSE 97 122*  BUN 43* 50*  CREATININE 1.64* 1.73*  CALCIUM 8.7 8.2*   CMET CMP     Component Value Date/Time   NA 134* 04/21/2011 0450   K 5.1 04/21/2011 0450   CL 105 04/21/2011 0450   CO2 21 04/21/2011 0450   GLUCOSE 97 04/21/2011 0450   BUN 43* 04/21/2011 0450   CREATININE 1.64* 04/21/2011 0450   CALCIUM 8.7 04/21/2011 0450   PROT 6.7 04/16/2011 0638   ALBUMIN 2.2* 04/16/2011 0638   AST 60* 04/16/2011 0638   ALT 58* 04/16/2011 0638   ALKPHOS 178* 04/16/2011 0638   BILITOT 0.6 04/16/2011 0638   GFRNONAA 40* 04/21/2011 0450   GFRAA 46* 04/21/2011 0450     Studies/Results: Dg Chest Port 1 View  04/20/2011  *RADIOLOGY REPORT*  Clinical Data: Line placement.  PORTABLE CHEST - 1 VIEW  Comparison: 04/15/2011.  Findings: There is a new right upper extremity PICC.  The tip is poorly visualized but appears to extend to at least the mid SVC. The position of the tip is not definitely visualized.  The lung volumes remain low with pulmonary vascular congestion and basilar atelectasis.  Cardiomegaly remains present.  IMPRESSION: New right upper extremity PICC.  The tip is not well visualized due to overlapping structures.  The tip is at least in the mid SVC.  A repeat PA chest radiograph with full inspiration may better demonstrate the position of the PICC line tip.  Radiology department radiographs are typically better than portable radiographs.  Otherwise unchanged appearance of the chest.  Original Report Authenticated By: Andreas Newport, M.D.    Medications: I have reviewed the patient's current medications.  Scheduled Meds:   . acetaminophen  650 mg Rectal Once  . ALPRAZolam  0.25 mg Oral Daily  . atorvastatin  40 mg Oral Daily  . atropine      . isosorbide mononitrate  60 mg Oral Daily  . lisinopril  10 mg Oral Daily  . metoCLOPramide (REGLAN) injection  10 mg Intravenous Q6H  . ondansetron      . pantoprazole (PROTONIX) IV  40 mg Intravenous Once  . perflutren lipid microspheres  10 microL/kg Intravenous Once  . phytonadione  5 mg Subcutaneous Once  . piperacillin-tazobactam (ZOSYN)  IV  3.375 g Intravenous Q8H  . vancomycin  1,000 mg Intravenous Q24H  . DISCONTD: aspirin  81 mg Oral Daily  . DISCONTD: enoxaparin (LOVENOX) injection  30 mg Subcutaneous Q24H  . DISCONTD: pantoprazole  40 mg Oral Q1200  . DISCONTD:  vancomycin  1,250 mg Intravenous Q24H   Continuous Infusions:   . sodium chloride 75 mL/hr at 04/21/11 1111  . pantoprozole (PROTONIX) infusion 8 mg/hr (04/21/11 1700)  . phenylephrine (NEO-SYNEPHRINE) Adult infusion Stopped (04/21/11 0358)  . DISCONTD: sodium chloride     PRN Meds:.acetaminophen, acetaminophen, ALPRAZolam, bisacodyl, fentaNYL, midazolam, ondansetron (ZOFRAN) IV, ondansetron, oxyCODONE-acetaminophen, polyethylene glycol, traMADol, zolpidem, DISCONTD: 0.9 % irrigation (POUR BTL), DISCONTD: metoCLOPramide (REGLAN) injection, DISCONTD: sodium chloride irrigation   Assessment/Plan:  Active Problems:  Cellulitis of leg-s/p drainage. Area is dressed. Continue the antibiotics.  Atrial fibrillation-permanent.  Agree with the dose of vitamin K given by GI.  His asa/lovenox/and coumadin all stopped due to significant acute upper gi bleed.  He is getting 2 uprbc and may need more. For the transient episodes of bradycardia, continue to monitor. Atropine at bedside. Dr. Donnie Aho is his cardiologist if further needs arise.  Hematemesis-see above.  Anemia, posthemorrhagic, acute-serial cbc's orderd. Continue ICU level care.   LOS: 6 days   Ezequiel Kayser, MD 04/21/2011, 6:01 PM

## 2011-04-22 ENCOUNTER — Encounter (HOSPITAL_COMMUNITY): Payer: Self-pay | Admitting: Internal Medicine

## 2011-04-22 ENCOUNTER — Encounter (HOSPITAL_COMMUNITY)
Admission: EM | Disposition: A | Discharge status: Home Health | Payer: Self-pay | Source: Home / Self Care | Attending: Internal Medicine

## 2011-04-22 HISTORY — PX: ESOPHAGOGASTRODUODENOSCOPY: SHX5428

## 2011-04-22 LAB — BASIC METABOLIC PANEL
Calcium: 8.4 mg/dL (ref 8.4–10.5)
Creatinine, Ser: 1.63 mg/dL — ABNORMAL HIGH (ref 0.50–1.35)
GFR calc non Af Amer: 40 mL/min — ABNORMAL LOW (ref >90)
Glucose, Bld: 131 mg/dL — ABNORMAL HIGH (ref 70–99)
Sodium: 137 mEq/L (ref 135–145)

## 2011-04-22 LAB — PROTIME-INR
INR: 1.55 — ABNORMAL HIGH (ref 0.00–1.49)
Prothrombin Time: 18.9 seconds — ABNORMAL HIGH (ref 11.6–15.2)

## 2011-04-22 LAB — COMPREHENSIVE METABOLIC PANEL
AST: 41 U/L — ABNORMAL HIGH (ref 0–37)
Albumin: 1.9 g/dL — ABNORMAL LOW (ref 3.5–5.2)
Calcium: 8.5 mg/dL (ref 8.4–10.5)
Creatinine, Ser: 1.7 mg/dL — ABNORMAL HIGH (ref 0.50–1.35)
Total Protein: 6.1 g/dL (ref 6.0–8.3)

## 2011-04-22 LAB — CBC
MCH: 29 pg (ref 26.0–34.0)
MCHC: 33.5 g/dL (ref 30.0–36.0)
MCV: 86.8 fL (ref 78.0–100.0)
Platelets: 415 10*3/uL — ABNORMAL HIGH (ref 150–400)
RDW: 17 % — ABNORMAL HIGH (ref 11.5–15.5)
WBC: 22.4 10*3/uL — ABNORMAL HIGH (ref 4.0–10.5)

## 2011-04-22 LAB — HEMOGLOBIN AND HEMATOCRIT, BLOOD: HCT: 24.1 % — ABNORMAL LOW (ref 39.0–52.0)

## 2011-04-22 SURGERY — EGD (ESOPHAGOGASTRODUODENOSCOPY)
Anesthesia: Moderate Sedation

## 2011-04-22 MED ORDER — ACETAMINOPHEN 325 MG PO TABS
325.0000 mg | ORAL_TABLET | Freq: Once | ORAL | Status: AC
  Administered 2011-04-22: 325 mg via ORAL
  Filled 2011-04-22: qty 1

## 2011-04-22 MED ORDER — MIDAZOLAM HCL 10 MG/2ML IJ SOLN
INTRAMUSCULAR | Status: AC
  Filled 2011-04-22: qty 2

## 2011-04-22 MED ORDER — VITAMINS A & D EX OINT
TOPICAL_OINTMENT | CUTANEOUS | Status: AC
  Filled 2011-04-22: qty 50

## 2011-04-22 MED ORDER — FENTANYL CITRATE 0.05 MG/ML IJ SOLN
INTRAMUSCULAR | Status: AC
  Filled 2011-04-22: qty 2

## 2011-04-22 NOTE — Progress Notes (Signed)
Subjective: Had a brady episode with nausea last night, HR in the 80's now.  About 500 cc of NG output, darker blood appearing now.  Feels ok, mostly thirsty this AM, alert and awake.  Being followed by GI and Cards for above, as well as Ortho for his leg abscess, which was his initial problem.  Objective: Vital signs in last 24 hours: Temp:  [97.9 F (36.6 C)-98.9 F (37.2 C)] 97.9 F (36.6 C) (04/02 0400) Pulse Rate:  [61-89] 88  (04/02 0600) Resp:  [13-27] 20  (04/02 0600) BP: (79-158)/(46-82) 158/73 mmHg (04/02 0600) SpO2:  [92 %-100 %] 100 % (04/02 0600) FiO2 (%):  [2 %] 2 % (04/01 1400) Weight:  [118.842 kg (262 lb)] 118.842 kg (262 lb) (04/01 1629) Weight change:  Last BM Date: 04/19/11  Intake/Output from previous day: 04/01 0701 - 04/02 0700 In: 1112.5 [I.V.:425; Blood:375; IV Piggyback:312.5] Out: 2402 [Urine:1700; Emesis/NG output:700; Stool:2] Intake/Output this shift:    General appearance: alert, cooperative and appears stated age Neck: no adenopathy, no carotid bruit, no JVD, supple, symmetrical, trachea midline and thyroid not enlarged, symmetric, no tenderness/mass/nodules Resp: clear to auscultation bilaterally Cardio: rate in the 80's, stable GI: soft, non-tender; bowel sounds normal; no masses,  no organomegaly Skin: Skin color, texture, turgor normal. No rashes or lesions Neurologic: Grossly normal  Lab Results:  Basename 04/22/11 0507 04/21/11 2110  WBC 22.4* 17.7*  HGB 9.0* 8.2*  HCT 26.9* 24.4*  PLT 415* 426*   BMET  Basename 04/22/11 0507 04/21/11 0450  NA 135 134*  K 5.3* 5.1  CL 106 105  CO2 20 21  GLUCOSE 134* 97  BUN 50* 43*  CREATININE 1.70* 1.64*  CALCIUM 8.5 8.7    Studies/Results: Dg Chest Port 1 View  04/20/2011  *RADIOLOGY REPORT*  Clinical Data: Line placement.  PORTABLE CHEST - 1 VIEW  Comparison: 04/15/2011.  Findings: There is a new right upper extremity PICC.  The tip is poorly visualized but appears to extend to at least  the mid SVC. The position of the tip is not definitely visualized.  The lung volumes remain low with pulmonary vascular congestion and basilar atelectasis.  Cardiomegaly remains present.  IMPRESSION: New right upper extremity PICC.  The tip is not well visualized due to overlapping structures.  The tip is at least in the mid SVC.  A repeat PA chest radiograph with full inspiration may better demonstrate the position of the PICC line tip.  Radiology department radiographs are typically better than portable radiographs.  Otherwise unchanged appearance of the chest.  Original Report Authenticated By: Andreas Newport, M.D.    Medications:  I have reviewed the patient's current medications. Scheduled:   . acetaminophen  650 mg Rectal Once  . ALPRAZolam  0.25 mg Oral Daily  . atorvastatin  40 mg Oral Daily  . atropine      . isosorbide mononitrate  60 mg Oral Daily  . lisinopril  10 mg Oral Daily  . metoCLOPramide (REGLAN) injection  10 mg Intravenous Q6H  . ondansetron      . pantoprazole (PROTONIX) IV  40 mg Intravenous Once  . perflutren lipid microspheres  10 microL/kg Intravenous Once  . phytonadione  5 mg Subcutaneous Once  . piperacillin-tazobactam (ZOSYN)  IV  3.375 g Intravenous Q8H  . vancomycin  1,000 mg Intravenous Q24H  . vitamin A & D      . DISCONTD: aspirin  81 mg Oral Daily  . DISCONTD: enoxaparin (LOVENOX) injection  30  mg Subcutaneous Q24H  . DISCONTD: pantoprazole  40 mg Oral Q1200  . DISCONTD: vancomycin  1,250 mg Intravenous Q24H   Continuous:   . sodium chloride 75 mL/hr at 04/21/11 1111  . pantoprozole (PROTONIX) infusion 8 mg/hr (04/22/11 0240)  . phenylephrine (NEO-SYNEPHRINE) Adult infusion Stopped (04/21/11 0358)   ZOX:[id number], acetaminophen, ALPRAZolam, bisacodyl, fentaNYL, midazolam, ondansetron (ZOFRAN) IV, ondansetron, oxyCODONE-acetaminophen, polyethylene glycol, traMADol, zolpidem, DISCONTD: 0.9 % irrigation (POUR BTL), DISCONTD: metoCLOPramide  (REGLAN) injection, DISCONTD: sodium chloride irrigation  Assessment/Plan: 1) Cellulitis L leg.  Empiric Vacn/Zosyn as cultures remain negative.  Ortho following (Gioffre) s/p drainage. 2) UGIB-  Followed by GI  Does not appear to be actively bleeding, Hgb up to 9 after last unit of transfusion.  Will check again this afternoon and transfuse further if needed.  IV PPI, NPO with NG 3) IVF for volume replacement 4) Bradycardia.  Seen by Cards, thought to be vasovagal in nature.  Atropine at bedside, but watch and wait approach.  No indication for pacing. 5) Renal insufficiency-  IVF, volume replacement, maintain BP, continue to follow. 6) Hyperkalemia-  Mild, should improve with IVF 7) Anticoagulation held due to GI bleed. Continue ICU care until H+H stabilize.  Will let GI decide on NG vs refeeding, likely in another day.  EGD planned repeat per their note.  LOS: 7 days   Tammie Ellsworth W 04/22/2011, 7:28 AM

## 2011-04-22 NOTE — Progress Notes (Signed)
CRITICAL VALUE ALERT  Critical value received:  Synovial body fluid culture positive ( co-ag negative staph)   Date of notification:  04/22/11  Time of notification:  1200  Critical value read back yes  Nurse who received alert:  Stanford Breed RN  MD notified (1st page):  Dr. Wylene Simmer  Time of first page:  1200  MD notified (2nd page):  Time of second page:  Responding MD:   Time MD responded:

## 2011-04-22 NOTE — Progress Notes (Signed)
Pt had three large, dark loose bms. Hgb 8.0 at 1600. Dr. Wylene Simmer notified and orders to transfuse 2 units given. Will continue to monitor. Geroge Baseman

## 2011-04-22 NOTE — Progress Notes (Signed)
Pt with episode of N/V  Noted with bright red blood @2300 .  During this episode pt noted with SB 30-35 unsustained  With a brief episode of LOC. Rapid Response Nurse at the bedside, E-Link called and Dr. Cathi Roan to video in. Pt's vitals: B/P 135/82, P 86, R 13, T 98.5.  Pox sat on 2L nasal cannula @ 100%. Per Rapid Response Nurse and Dr. Cathi Roan... Pacer pads placed on patient and atropine maintained at the bedside. See cardiologist consult note. Will continue to monitor patient per M.D. Orders and unit protocol. One unit of PRBC's to infusing..start time @ 2300 on 04/21/11

## 2011-04-22 NOTE — Progress Notes (Signed)
Patient Name: Joe [name] Date of Encounter: 04/22/2011    SUBJECTIVE: There are no specific cardiac complaints. He complains of being thirsty and dry. The NG tube is uncomfortable. He has intermittent nausea.  TELEMETRY:  Atrial fibrillation with controlled rate and occasional severe bradycardia in the 20-30 range, transient. Had one pause greater than 6 seconds around 3 AM. Filed Vitals:   04/22/11 0300 04/22/11 0400 04/22/11 0500 04/22/11 0600  BP: 146/68 147/65 145/65 158/73  Pulse: 84 86 88 88  Temp:  97.9 F (36.6 C)    TempSrc:  Oral    Resp: 22 21 21 20   Height:      Weight:      SpO2: 98% 100% 100% 100%    Intake/Output Summary (Last 24 hours) at 04/22/11 0807 Last data filed at 04/22/11 0600  Gross per 24 hour  Intake   1025 ml  Output   2252 ml  Net  -1227 ml    LABS: Basic Metabolic Panel:  Basename 04/22/11 0507 04/21/11 0450  NA 135 134*  K 5.3* 5.1  CL 106 105  CO2 20 21  GLUCOSE 134* 97  BUN 50* 43*  CREATININE 1.70* 1.64*  CALCIUM 8.5 8.7  MG -- --  PHOS -- --   CBC:  Basename 04/22/11 0507 04/21/11 2110  WBC 22.4* 17.7*  NEUTROABS -- --  HGB 9.0* 8.2*  HCT 26.9* 24.4*  MCV 86.8 86.2  PLT 415* 426*    Radiology/Studies:  None pertinent to cardiology  Physical Exam: Blood pressure 158/73, pulse 88, temperature 97.9 F (36.6 C), temperature source Oral, resp. rate 20, height 5\' 10"  (1.778 m), weight 118.842 kg (262 lb), SpO2 100.00%. Weight change:    Irregularly irregular rhythm. No murmur.  ASSESSMENT:  1. Transient high grade AV block in this patient with an NG tube, abdominal tenderness/pain, and overall uncomfortable. The bradycardia is most likely due to neurogenic causes (vasovagal).  2. Upper GI bleed, with NG tube in place  3. Lower extremity abscess drained   Plan:  1. Remove NG tube as soon as possible as this is the source of the patient's vagal component most likely.  2. If he develops recurrent/persistent  bradycardia with started IV dopamine to improve AV conduction.  3. No indication for pacemaker at this time.  Joe [name] 04/22/2011, 8:07 AM

## 2011-04-22 NOTE — Op Note (Signed)
[facility] [address] [place], [place]  [address]  ENDOSCOPY PROCEDURE REPORT  PATIENT:  Joe, [name]  MR#:  [id number] BIRTHDATE:  08/21/1935, 75 yrs. old  GENDER:  male ENDOSCOPIST:  Carie Caddy. , MD Referred by:  Geoffry Paradise, M.D. PROCEDURE DATE:  04/22/2011 PROCEDURE:  EGD, diagnostic 43235 ASA CLASS:  Class III INDICATIONS:  melena, hematemesis MEDICATIONS:   Fentanyl 37.5 mcg IV, Versed 5 mg IV TOPICAL ANESTHETIC:  Cetacaine Spray  DESCRIPTION OF PROCEDURE:   After the risks benefits and alternatives of the procedure were thoroughly explained, informed consent was obtained.  The  endoscope was introduced through the mouth and advanced to the bulb of duodenum, limited by blood and clots.   The instrument was slowly withdrawn as the mucosa was fully examined. <<PROCEDUREIMAGES>>  Mild NG tube trauma noted in the mid esophagus.  Otherwise normal esophagus.  A hiatal hernia was found.  Clotted blood and heme was found in the throughout the stomach, and limited view particularly in the gastric fundus.  No active bleeding or evidence of recent bleeding was seen in the stomach.  A large blood clot was again seen protruding from the duodenal bulb through the pylorus and into the gastric antrum.  The duodenal bulb was entered and again was filled with clotted blood.  No active bleeding was seen in the bulb.  Attempts were made with irrigation, lavage and suction to disrupt the large clot, but this was not possible and the source of the clot (presumed to be a duodenal ulcer) was not seen. Retroflexed views revealed findings as previously described.    The scope was then withdrawn from the patient and the procedure completed.  COMPLICATIONS:  None  ENDOSCOPIC IMPRESSION: 1) Mild NG tube trauma in the mid esophagus 2) Otherwise normal esophagus 3) Hiatal hernia 4) Old blood and clots in the entire stomach limiting views of the gastric cardia and fundus. 5)  Large blood clot in the bulb of duodenum extending through the pyloric channel and into the antrum. 6) Presumed source of GI bleeding is duodenal bulb ulceration, but source was not seen due to the presence of large clots.  RECOMMENDATIONS: 1) Continue PPI infusion 2) Continue to hold anticoagulation. 3) Closely monitor Hgb/HCT and transfuse as needed. 4) Avoid NSAIDs 5) Check H. Pylori serum antibody and treat if positive. 6) For active rebleeding, considerations include repeat EGD and VIR for angiography.  Carie Caddy. Rhea Belton, MD  CC:  Geoffry Paradise, MD  n. Rosalie DoctorCarie Caddy.  at 04/22/2011 10:16 AM  Harrington Challenger, [phone]

## 2011-04-22 NOTE — Progress Notes (Signed)
Subjective: Negative Cultures thus far. He will have another Endoscopy this A.M. Still has bloody Drainage in NG Tube. WBC is 22.4 and Hbg is 9.0. Denies knee pain and no erythema around his knee.No change in his calf status.  Objective: Vital signs in last 24 hours: Temp:  [97.9 F (36.6 C)-98.9 F (37.2 C)] 97.9 F (36.6 C) (04/02 0400) Pulse Rate:  [61-89] 88  (04/02 0600) Resp:  [13-27] 18  (04/02 0857) BP: (79-158)/(46-82) 141/68 mmHg (04/02 0857) SpO2:  [92 %-100 %] 99 % (04/02 0857) FiO2 (%):  [2 %] 2 % (04/01 1400) Weight:  [161.096 kg (262 lb)] 118.842 kg (262 lb) (04/01 1629)  Intake/Output from previous day: 04/01 0701 - 04/02 0700 In: 1112.5 [I.V.:425; Blood:375; IV Piggyback:312.5] Out: 2402 [Urine:1700; Emesis/NG output:700; Stool:2] Intake/Output this shift:     Basename 04/22/11 0507 04/21/11 2110 04/21/11 1400 04/21/11 0450 04/20/11 1745  HGB 9.0* 8.2* 7.4* 8.6* 7.7*    Basename 04/22/11 0507 04/21/11 2110  WBC 22.4* 17.7*  RBC 3.10* 2.83*  HCT 26.9* 24.4*  PLT 415* 426*    Basename 04/22/11 0507 04/21/11 0450  NA 135 134*  K 5.3* 5.1  CL 106 105  CO2 20 21  BUN 50* 43*  CREATININE 1.70* 1.64*  GLUCOSE 134* 97  CALCIUM 8.5 8.7    Basename 04/22/11 0507 04/21/11 1400  LABPT -- --  INR 1.55* 1.67*    Neurologically intact Dorsiflexion/Plantar flexion intact Compartment soft  Assessment/Plan: Awaiting Culture updates.   Gini Caputo A 04/22/2011, 9:01 AM

## 2011-04-22 NOTE — Progress Notes (Signed)
Willette Cluster NP with Dr. Rhea Belton notified of hgb at 8.2, down from 9.0 this am. No orders for transfusion given. Told to continue to monitor for any active bleeding. No signs of active bleeding currently. Will continue to monitor. Geroge Baseman

## 2011-04-23 ENCOUNTER — Encounter (HOSPITAL_COMMUNITY): Payer: Self-pay

## 2011-04-23 ENCOUNTER — Ambulatory Visit (HOSPITAL_COMMUNITY): Admission: RE | Admit: 2011-04-23 | Payer: Medicare Other | Source: Ambulatory Visit | Admitting: Orthopedic Surgery

## 2011-04-23 ENCOUNTER — Encounter (HOSPITAL_COMMUNITY): Admission: RE | Payer: Self-pay | Source: Ambulatory Visit

## 2011-04-23 ENCOUNTER — Encounter (HOSPITAL_COMMUNITY): Payer: Self-pay | Admitting: Internal Medicine

## 2011-04-23 DIAGNOSIS — L02419 Cutaneous abscess of limb, unspecified: Secondary | ICD-10-CM

## 2011-04-23 DIAGNOSIS — L03119 Cellulitis of unspecified part of limb: Secondary | ICD-10-CM

## 2011-04-23 DIAGNOSIS — K922 Gastrointestinal hemorrhage, unspecified: Secondary | ICD-10-CM

## 2011-04-23 LAB — CBC
HCT: 24.7 % — ABNORMAL LOW (ref 39.0–52.0)
Hemoglobin: 8.3 g/dL — ABNORMAL LOW (ref 13.0–17.0)
MCH: 29.1 pg (ref 26.0–34.0)
RBC: 2.85 MIL/uL — ABNORMAL LOW (ref 4.22–5.81)

## 2011-04-23 LAB — PREPARE RBC (CROSSMATCH)

## 2011-04-23 LAB — COMPREHENSIVE METABOLIC PANEL
ALT: 23 U/L (ref 0–53)
Alkaline Phosphatase: 63 U/L (ref 39–117)
CO2: 22 mEq/L (ref 19–32)
GFR calc Af Amer: 48 mL/min — ABNORMAL LOW (ref >90)
GFR calc non Af Amer: 41 mL/min — ABNORMAL LOW (ref >90)
Glucose, Bld: 96 mg/dL (ref 70–99)
Potassium: 4.4 mEq/L (ref 3.5–5.1)
Sodium: 135 mEq/L (ref 135–145)

## 2011-04-23 LAB — SEDIMENTATION RATE: Sed Rate: 80 mm/hr — ABNORMAL HIGH (ref 0–16)

## 2011-04-23 LAB — PROTIME-INR
INR: 1.39 (ref 0.00–1.49)
Prothrombin Time: 17.3 seconds — ABNORMAL HIGH (ref 11.6–15.2)

## 2011-04-23 LAB — CULTURE, ROUTINE-ABSCESS

## 2011-04-23 LAB — LIPASE, BLOOD: Lipase: 82 U/L — ABNORMAL HIGH (ref 11–59)

## 2011-04-23 LAB — C-REACTIVE PROTEIN: CRP: 1.61 mg/dL — ABNORMAL HIGH (ref <0.60)

## 2011-04-23 SURGERY — REMOVAL, HARDWARE
Anesthesia: General | Laterality: Left

## 2011-04-23 MED ORDER — VITAMIN K1 10 MG/ML IJ SOLN
5.0000 mg | Freq: Once | INTRAVENOUS | Status: AC
  Administered 2011-04-23: 5 mg via INTRAVENOUS
  Filled 2011-04-23: qty 0.5

## 2011-04-23 MED ORDER — CEFAZOLIN SODIUM 1-5 GM-% IV SOLN
1.0000 g | Freq: Three times a day (TID) | INTRAVENOUS | Status: DC
  Administered 2011-04-23 – 2011-04-28 (×15): 1 g via INTRAVENOUS
  Filled 2011-04-23 (×19): qty 50

## 2011-04-23 MED ORDER — ACETAMINOPHEN 325 MG PO TABS
650.0000 mg | ORAL_TABLET | Freq: Once | ORAL | Status: AC
  Administered 2011-04-23: 650 mg via ORAL
  Filled 2011-04-23: qty 1

## 2011-04-23 MED ORDER — RIFAMPIN 300 MG PO CAPS
300.0000 mg | ORAL_CAPSULE | Freq: Every day | ORAL | Status: DC
  Administered 2011-04-23 – 2011-04-28 (×6): 300 mg via ORAL
  Filled 2011-04-23 (×6): qty 1

## 2011-04-23 MED ORDER — TAMSULOSIN HCL 0.4 MG PO CAPS
0.4000 mg | ORAL_CAPSULE | Freq: Every day | ORAL | Status: DC
  Administered 2011-04-23 – 2011-04-28 (×6): 0.4 mg via ORAL
  Filled 2011-04-23 (×6): qty 1

## 2011-04-23 NOTE — Progress Notes (Signed)
Subjective: Still having stool with blood.  Was transfused 2 units yesterday and his H+H is no different due to loss.  Feels ok, still thirsty, NG tube out post yesterday's endoscopy.  Objective: Vital signs in last 24 hours: Temp:  [97.5 F (36.4 C)-98.9 F (37.2 C)] 98.5 F (36.9 C) (04/03 0400) Pulse Rate:  [25-99] 81  (04/03 0600) Resp:  [13-25] 19  (04/03 0600) BP: (95-162)/(35-83) 105/43 mmHg (04/03 0600) SpO2:  [96 %-100 %] 100 % (04/03 0600) Weight change:  Last BM Date: 04/22/11  Intake/Output from previous day: 04/02 0701 - 04/03 0700 In: 3007.5 [P.O.:520; I.V.:1675; Blood:512.5; IV Piggyback:300] Out: 2430 [Urine:2430] Intake/Output this shift:   General appearance: alert, cooperative and appears stated age  Neck: no adenopathy, no carotid bruit, no JVD, supple, symmetrical, trachea midline and thyroid not enlarged, symmetric, no tenderness/mass/nodules  Resp: clear to auscultation bilaterally  Cardio: rate in the 80's, stable  GI: soft, non-tender; bowel sounds normal; no masses, no organomegaly  Skin: Skin color, texture, turgor normal. No rashes or lesions  Neurologic: Grossly normal   Lab Results:  Basename 04/23/11 0445 04/23/11 0200 04/22/11 0507  WBC 12.3* -- 22.4*  HGB 8.3* 8.3* --  HCT 24.7* 24.6* --  PLT 382 -- 415*   BMET  Basename 04/23/11 0445 04/22/11 1535  NA 135 137  K 4.4 4.5  CL 107 107  CO2 22 21  GLUCOSE 96 131*  BUN 40* 43*  CREATININE 1.59* 1.63*  CALCIUM 8.0* 8.4    Studies/Results: No results found.  Medications:  I have reviewed the patient's current medications. Scheduled:   . acetaminophen  650 mg Rectal Once  . acetaminophen  325 mg Oral Once  . acetaminophen  650 mg Oral Once  . ALPRAZolam  0.25 mg Oral Daily  . atorvastatin  40 mg Oral Daily  . isosorbide mononitrate  60 mg Oral Daily  . lisinopril  10 mg Oral Daily  . metoCLOPramide (REGLAN) injection  10 mg Intravenous Q6H  . perflutren lipid microspheres   10 microL/kg Intravenous Once  . phytonadione (VITAMIN K) IV  5 mg Intravenous Once  . piperacillin-tazobactam (ZOSYN)  IV  3.375 g Intravenous Q8H  . vancomycin  1,000 mg Intravenous Q24H  . vitamin A & D       Continuous:   . sodium chloride 100 mL/hr at 04/23/11 0619  . pantoprozole (PROTONIX) infusion 8 mg/hr (04/23/11 0026)  . phenylephrine (NEO-SYNEPHRINE) Adult infusion Stopped (04/21/11 0358)   ZOX:[id number], acetaminophen, ALPRAZolam, bisacodyl, fentaNYL, midazolam, ondansetron (ZOFRAN) IV, ondansetron, oxyCODONE-acetaminophen, polyethylene glycol, traMADol, zolpidem  Assessment/Plan: 1) Cellulitis L leg. Empiric Vacn/Zosyn as cultures show coag negative staph.  Sensitivities pending. Ortho following (Gioffre) s/p drainage.  2) UGIB- Followed by GI Actively bleeding, Hgb still in 8's and blood loss s/p 2 units yesterday evening.  Transfuse further 2 units this AM  IV PPI,  Will also give IV Vit K again 3) IVF for volume replacement  4) Bradycardia. Seen by Cards, thought to be vasovagal in nature. Atropine at bedside, but watch and wait approach. No indication for pacing. Has not recurred in past day 5) Renal insufficiency- IVF, volume replacement, maintain BP, continue to follow.  6) Hyperkalemia- resolved 7) Anticoagulation held due to GI bleed.  Continue ICU care until H+H stabilize.GI to see, may wish to make another attempt at EGD given his continued loss. 8) Notes difficulty with urination.  Will add Flomax, if still issues, ok to place Foley.  LOS: 8 days  Joe [name] W 04/23/2011, 7:28 AM

## 2011-04-23 NOTE — Progress Notes (Signed)
Subjective: Improving. Case discussed with patient and Dr. Marshell Garfinkel.I also called and spoke with Dr. Bertram Millard Disease and she will follow. Presently has a [employer] and is on Vancomycin. His WBC has been decreasing and Hbg is 8.3. GI Bleeding is under control and NG tube has been Dcd. I changed his dressing again and his wound is very good. Left Knee is non-tender and no Erythema is present.   Objective: Vital signs in last 24 hours: Temp:  [98.1 F (36.7 C)-98.9 F (37.2 C)] 98.3 F (36.8 C) (04/03 1315) Pulse Rate:  [63-96] 69  (04/03 1500) Resp:  [13-22] 19  (04/03 1500) BP: (95-139)/(35-85) 103/57 mmHg (04/03 1500) SpO2:  [96 %-100 %] 98 % (04/03 1500)  Intake/Output from previous day: 04/02 0701 - 04/03 0700 In: 3157.5 [P.O.:520; I.V.:1800; Blood:512.5; IV Piggyback:325] Out: 2430 [Urine:2430] Intake/Output this shift: Total I/O In: 2075 [I.V.:1000; Blood:825; IV Piggyback:250] Out: 650 [Urine:650]   Basename 04/23/11 0445 04/23/11 0200 04/22/11 1535 04/22/11 1100 04/22/11 0507  HGB 8.3* 8.3* 8.0* 8.2* 9.0*    Basename 04/23/11 0445 04/23/11 0200 04/22/11 0507  WBC 12.3* -- 22.4*  RBC 2.85* -- 3.10*  HCT 24.7* 24.6* --  PLT 382 -- 415*    Basename 04/23/11 0445 04/22/11 1535  NA 135 137  K 4.4 4.5  CL 107 107  CO2 22 21  BUN 40* 43*  CREATININE 1.59* 1.63*  GLUCOSE 96 131*  CALCIUM 8.0* 8.4    Basename 04/22/11 0507 04/21/11 1400  LABPT -- --  INR 1.55* 1.67*    Neurovascular intact No cellulitis present Compartment soft  Assessment/Plan: Will follow with Infectious Disease. I informed patient that there is Rare Staph. In wound-Knee only, that we will try to treat him with IV antibiotics and follow him closely. He has had a long History of a low grade infection in this knee The surgical Site "Abscess" is thus far Sterile.   Breydan Shillingburg A 04/23/2011, 4:02 PM

## 2011-04-23 NOTE — Progress Notes (Addendum)
Patient seen, examined, and I agree with the above documentation, including the assessment and plan. Pt denies complaints at present. No further nausea or vomiting. Per patient and RN he is have melenic stools, but smaller volume today.   EGD x 2 showed large blood clot in duodenal bulb and gastric antrum, likely DU DU felt NSAID induced, H pylori AB neg. He received 2 units pRBC finished recently, awaiting repeat Hgb. If he continues to need blood transfusion, then further therapy needs to be attempted, with repeat EGD or VIR for possible embolization. I favor repeated attempt at EGD. Still off anticoagulation.  Received additional Vit K today. Ortho not planning repeat procedure, but rather treatment with likely prolonged course of ABX Will follow closely.

## 2011-04-23 NOTE — Progress Notes (Signed)
SUBJECTIVE:  No palpitations or syncope.  OBJECTIVE:   Vitals:   Filed Vitals:   04/23/11 1315 04/23/11 1400 04/23/11 1500 04/23/11 1600  BP: 131/63 110/50 103/57 117/52  Pulse:  71 69 72  Temp: 98.3 F (36.8 C)     TempSrc:      Resp:  22 19 17   Height:      Weight:      SpO2:  100% 98% 98%   I&O's:   Intake/Output Summary (Last 24 hours) at 04/23/11 1607 Last data filed at 04/23/11 1600  Gross per 24 hour  Intake 3982.5 ml  Output   2280 ml  Net 1702.5 ml   TELEMETRY: Reviewed telemetry pt in AFib, rate controlled.     PHYSICAL EXAM General: Well developed, well nourished, in no acute distress Head:    Normal cephalic and atraumatic  Lungs:   Clear bilaterally to auscultation and percussion. Heart:  Irregularly irregular rhythm, rate controlled Abdomen: obese Msk:  Back normal, normal gait. Normal strength and tone for age. Extremities:   No edema.   Neuro: Alert and oriented X 3. Psych:  Good affect, responds appropriately   LABS: Basic Metabolic Panel:  Basename 04/23/11 0445 04/22/11 1535  NA 135 137  K 4.4 4.5  CL 107 107  CO2 22 21  GLUCOSE 96 131*  BUN 40* 43*  CREATININE 1.59* 1.63*  CALCIUM 8.0* 8.4  MG -- --  PHOS -- --   Liver Function Tests:  [facility] 04/23/11 0445 04/22/11 0507  AST 42* 41*  ALT 23 24  ALKPHOS 63 76  BILITOT 0.5 0.6  PROT 5.4* 6.1  ALBUMIN 1.7* 1.9*    Basename 04/22/11 0507  LIPASE 714*  AMYLASE --   CBC:  Basename 04/23/11 0445 04/23/11 0200 04/22/11 0507  WBC 12.3* -- 22.4*  NEUTROABS -- -- --  HGB 8.3* 8.3* --  HCT 24.7* 24.6* --  MCV 86.7 -- 86.8  PLT 382 -- 415*   Cardiac Enzymes: No results found for this basename: CKTOTAL:3,CKMB:3,CKMBINDEX:3,TROPONINI:3 in the last 72 hours BNP: No components found with this basename: POCBNP:3 D-Dimer: No results found for this basename: DDIMER:2 in the last 72 hours Hemoglobin A1C: No results found for this basename: HGBA1C in the last 72 hours Fasting  Lipid Panel: No results found for this basename: CHOL,HDL,LDLCALC,TRIG,CHOLHDL,LDLDIRECT in the last 72 hours Thyroid Function Tests: No results found for this basename: TSH,T4TOTAL,FREET3,T3FREE,THYROIDAB in the last 72 hours Anemia Panel: No results found for this basename: VITAMINB12,FOLATE,FERRITIN,TIBC,IRON,RETICCTPCT in the last 72 hours Coag Panel:   Lab Results  Component Value Date   INR 1.55* 04/22/2011   INR 1.67* 04/21/2011   INR 1.52* 04/17/2011    RADIOLOGY:  Dg Chest Port 1 View  04/20/2011  *RADIOLOGY REPORT*  Clinical Data: Line placement.  PORTABLE CHEST - 1 VIEW  Comparison: 04/15/2011.  Findings: There is a new right upper extremity PICC.  The tip is poorly visualized but appears to extend to at least the mid SVC. The position of the tip is not definitely visualized.  The lung volumes remain low with pulmonary vascular congestion and basilar atelectasis.  Cardiomegaly remains present.  IMPRESSION: New right upper extremity PICC.  The tip is not well visualized due to overlapping structures.  The tip is at least in the mid SVC.  A repeat PA chest radiograph with full inspiration may better demonstrate the position of the PICC line tip.  Radiology department radiographs are typically better than portable radiographs.  Otherwise unchanged appearance  of the chest.  Original Report Authenticated By: Andreas Newport, M.D.        ASSESSMENT: AFib, bradycardia, sinus pauses  PLAN:  No further pauses or bradycardia today. Those events have only occurred with vagal stimulation.  NG tube is out.  No further vomiting. COntinue treatment for UGI bleed.  Hemodynamically stable at this time.  Corky Crafts., MD  04/23/2011  4:07 PM

## 2011-04-23 NOTE — Progress Notes (Signed)
[facility] Progress Note  SUBJECTIVE: feels okay but still having loose black stools.  OBJECTIVE:  Vital signs in last 24 hours: Temp:  [97.6 F (36.4 C)-98.9 F (37.2 C)] 98.4 F (36.9 C) (04/03 1020) Pulse Rate:  [63-96] 78  (04/03 1010) Resp:  [13-22] 21  (04/03 1010) BP: (95-155)/(35-83) 108/53 mmHg (04/03 1020) SpO2:  [96 %-100 %] 99 % (04/03 1010) Last BM Date: 04/22/11 General:    pleasant white male in NAD Heart:  Regular rate and rhythm Lungs: Respirations even and unlabored Abdomen:  Soft, nontender and nondistended. Normal bowel sounds. Extremities:  Without edema. Neurologic:  Alert and oriented,  grossly normal neurologically. Psych:  Cooperative. Talkative.   Intake/Output from previous day: 04/02 0701 - 04/03 0700 In: 3157.5 [P.O.:520; I.V.:1800; Blood:512.5; IV Piggyback:325] Out: 2430 [Urine:2430] Intake/Output this shift: Total I/O In: 712.5 [I.V.:375; Blood:312.5; IV Piggyback:25] Out: 400 [Urine:400]  Lab Results:  Basename 04/23/11 0445 04/23/11 0200 04/22/11 1535 04/22/11 0507 04/21/11 2110  WBC 12.3* -- -- 22.4* 17.7*  HGB 8.3* 8.3* 8.0* -- --  HCT 24.7* 24.6* 24.3* -- --  PLT 382 -- -- 415* 426*   BMET  Basename 04/23/11 0445 04/22/11 1535 04/22/11 0507  NA 135 137 135  K 4.4 4.5 5.3*  CL 107 107 106  CO2 22 21 20   GLUCOSE 96 131* 134*  BUN 40* 43* 50*  CREATININE 1.59* 1.63* 1.70*  CALCIUM 8.0* 8.4 8.5   LFT  Basename 04/23/11 0445  PROT 5.4*  ALBUMIN 1.7*  AST 42*  ALT 23  ALKPHOS 63  BILITOT 0.5  BILIDIR --  IBILI --   PT/INR  Basename 04/22/11 0507 04/21/11 1400  LABPROT 18.9* 20.0*  INR 1.55* 1.67*     ASSESSMENT / PLAN:  1.  Upper GI bleed. EGD yesterday revealed blood clots in stomach and large clot in duodenal bulb but no active bleeding seen. Patient reports loose black stools during the night and this am. Stools less volume today (per RN). He has been hemodynamically stable. Difficult to know  for sure if still actively bleeding but it is concerning that his hemoglobin is about same as it was yesterday, despite two units of blood yesterday evening /night.  Will watch closely, RN to call ASAP for increasing number of black stools, hemodynamic instability. Continue PPI drip, serial hemoglobins.  2.  Anemia of acute blood loss, hemoglobin 8.3 which is about same as prior to the 2 units of blood yesterday. Will monitor him closely. He is getting another unit of blood now.  3.  Elevated LFTs, Alk phos high several days ago but has normalized.    4.  Elevated Lipase of 714 on 04/22/11, ?etiology. Will recheck lipase.  5.  Left calf abscess, s/p I&D 6.  Elevated INR,  Looking back in records, patient hot a 7.5mg  dose of coumadin on 04/15/11. Last INR on 4/2 was 1.55. PCP orderd another dose of Vit K today. Will recheck INR to see where we are.     LOS: 8 days   Willette Cluster  04/23/2011, 11:00 AM

## 2011-04-23 NOTE — Consult Note (Signed)
Date of Admission:  04/15/2011  Date of Consult:  04/23/2011  Reason for Consult: Calf Abscess Referring Physician: Darrelyn Hillock  Impression/Recommendation L TKR infection , MSSE Calf Abscess Would- stop vanco and zosyn Resume ancef Start rifampin.  Follow LFTs, PT Check ESR and CRP Comment- presence of prosthetics suggests need for therapy for static organisms. With the length that his prosthesis has been in place, it may be difficult to cure. If cure not the goal but instead suppression, will plan for long term PO anbx in follow up after he gets ~ 6 weeks IV.   Joe [name] is an 76 y.o. male.  HPI: 76 yo M with hx of  L TKR ("placed 10-15 yrs ago"), replaced 10-2006 after fracture/fall. Also R TKR. He states he has had multiple infections in his knee. Review of his records shows "bilateral lower extremity staph infections". No Cx+ since 2008. States his L TKR is chronically warm and swollen.  Recently he was eval for loosening of his femoral pin in his L TKR. He had prior had an attempt to remove his femoral pin in office but this was not able to be accomplished. He was scheduled for surgery as an inpatient.   He is adm to the hospital 3-26 via EMS with fever, n/v, abd pain and tenderness, urinary frequency and urgency. He was started on vanco and zosyn. He had also noted that his LLE had become red and swollen.  His renal function worsened and on 04-16-11 he was changed to ancef. By 04-18-11 he was changed back to vancomycin. On 04-19-11 he underwent aspiration of TKR (19k WBC, Cx MSSE). He also underwent I & D of L calf abscess (Cx -).  His course was further complicated by hematemesis after being started on Lovenox for new afib. He underwent EGD on 4-1 showing multiple large clots (this was confirmed on an EGD on 04-22-11). His course was also complicated by high grade AVB and bradycardia.   Past Medical History  Diagnosis Date  . Hypertension   . Hypercholesteremia   . Anxiety   . Obesity (BMI  35.0-39.9 without comorbidity)     Past Surgical History  Procedure Date  . Coronary artery bypass graft   . Joint replacement   . Foot surgery   . Leg surgery   . Appendectomy   . I&d extremity 04/19/2011    Procedure: IRRIGATION AND DEBRIDEMENT EXTREMITY;  Surgeon: Eugenia Mcalpine, MD;  Location: [facility];  Service: Orthopedics;  Laterality: Left;  aspiration of left total knee fluid and irrigation and debridement of left calf abscess  . Esophagogastroduodenoscopy 04/21/2011    Procedure: ESOPHAGOGASTRODUODENOSCOPY (EGD);  Surgeon: Beverley Fiedler, MD;  Location: [facility];  Service: Gastroenterology;  Laterality: N/A;  . Esophagogastroduodenoscopy 04/22/2011    Procedure: ESOPHAGOGASTRODUODENOSCOPY (EGD);  Surgeon: Beverley Fiedler, MD;  Location: [facility];  Service: Gastroenterology;  Laterality: N/A;  travel case  ergies:   Allergies  Allergen Reactions  . Biaxin Rash    Medications:  Scheduled:   . acetaminophen  650 mg Rectal Once  . acetaminophen  325 mg Oral Once  . acetaminophen  650 mg Oral Once  . ALPRAZolam  0.25 mg Oral Daily  . atorvastatin  40 mg Oral Daily  . isosorbide mononitrate  60 mg Oral Daily  . lisinopril  10 mg Oral Daily  . perflutren lipid microspheres  10 microL/kg Intravenous Once  . phytonadione (VITAMIN K) IV  5 mg Intravenous Once  . piperacillin-tazobactam (ZOSYN)  IV  3.375 g Intravenous Q8H  . Tamsulosin HCl  0.4 mg Oral QPC breakfast  . vancomycin  1,000 mg Intravenous Q24H    Social History:  reports that he has never smoked. He has never used smokeless tobacco. He reports that he does not drink alcohol or use illicit drugs.  History reviewed. No pertinent family history.  General ROS: no further melena/BRBPR, no further emesis, normal urination, no cough, no SOB, see HPI.  Blood pressure 117/52, pulse 72, temperature 98.5 F (36.9 C), temperature source Oral, resp. rate 17, height 5\' 10"  (1.778 m), weight 118.842 kg (262 lb), SpO2  98.00%. General appearance: alert, cooperative, appears stated age and no distress Eyes: negative findings: pupils equal, round, reactive to light and accomodation Throat: lips, mucosa, and tongue normal; teeth and gums normal Lungs: clear to auscultation bilaterally Heart: regular rate and rhythm Abdomen: normal findings: bowel sounds normal and soft, non-tender Extremities: R TKR is non-swollen, non-tender. L TKR is swollen, warm, non-tender. His distal LLE is wrapped. there is a PIC in his RUE.    Results for orders placed during the hospital encounter of 04/15/11 (from the past 48 hour(s))  CBC     Status: Abnormal   Collection Time   04/21/11  9:10 PM      Component Value Range Comment   WBC 17.7 (*) 4.0 - 10.5 (K/uL)    RBC 2.83 (*) 4.22 - 5.81 (MIL/uL)    Hemoglobin 8.2 (*) 13.0 - 17.0 (g/dL) POST TRANSFUSION SPECIMEN   HCT 24.4 (*) 39.0 - 52.0 (%)    MCV 86.2  78.0 - 100.0 (fL)    MCH 29.0  26.0 - 34.0 (pg)    MCHC 33.6  30.0 - 36.0 (g/dL)    RDW 16.1 (*) 09.6 - 15.5 (%)    Platelets 426 (*) 150 - 400 (K/uL)   PREPARE RBC (CROSSMATCH)     Status: Normal   Collection Time   04/21/11 10:00 PM      Component Value Range Comment   Order Confirmation ORDER PROCESSED BY BLOOD BANK     CBC     Status: Abnormal   Collection Time   04/22/11  5:07 AM      Component Value Range Comment   WBC 22.4 (*) 4.0 - 10.5 (K/uL)    RBC 3.10 (*) 4.22 - 5.81 (MIL/uL)    Hemoglobin 9.0 (*) 13.0 - 17.0 (g/dL)    HCT 04.5 (*) 40.9 - 52.0 (%)    MCV 86.8  78.0 - 100.0 (fL)    MCH 29.0  26.0 - 34.0 (pg)    MCHC 33.5  30.0 - 36.0 (g/dL)    RDW 81.1 (*) 91.4 - 15.5 (%)    Platelets 415 (*) 150 - 400 (K/uL)   COMPREHENSIVE METABOLIC PANEL     Status: Abnormal   Collection Time   04/22/11  5:07 AM      Component Value Range Comment   Sodium 135  135 - 145 (mEq/L)    Potassium 5.3 (*) 3.5 - 5.1 (mEq/L)    Chloride 106  96 - 112 (mEq/L)    CO2 20  19 - 32 (mEq/L)    Glucose, Bld 134 (*) 70 - 99  (mg/dL)    BUN 50 (*) 6 - 23 (mg/dL)    Creatinine, Ser 7.82 (*) 0.50 - 1.35 (mg/dL)    Calcium 8.5  8.4 - 10.5 (mg/dL)    Total Protein 6.1  6.0 - 8.3 (g/dL)  Albumin 1.9 (*) 3.5 - 5.2 (g/dL)    AST 41 (*) 0 - 37 (U/L)    ALT 24  0 - 53 (U/L)    Alkaline Phosphatase 76  39 - 117 (U/L)    Total Bilirubin 0.6  0.3 - 1.2 (mg/dL)    GFR calc non Af Amer 38 (*) >90 (mL/min)    GFR calc Af Amer 44 (*) >90 (mL/min)   LIPASE, BLOOD     Status: Abnormal   Collection Time   04/22/11  5:07 AM      Component Value Range Comment   Lipase 714 (*) 11 - 59 (U/L)   PROTIME-INR     Status: Abnormal   Collection Time   04/22/11  5:07 AM      Component Value Range Comment   Prothrombin Time 18.9 (*) 11.6 - 15.2 (seconds)    INR 1.55 (*) 0.00 - 1.49    OCCULT BLOOD X 1 CARD TO LAB, STOOL     Status: Normal   Collection Time   04/22/11  8:35 AM      Component Value Range Comment   Fecal Occult Bld POSITIVE     HEMOGLOBIN AND HEMATOCRIT, BLOOD     Status: Abnormal   Collection Time   04/22/11 11:00 AM      Component Value Range Comment   Hemoglobin 8.2 (*) 13.0 - 17.0 (g/dL)    HCT 40.9 (*) 81.1 - 52.0 (%)   H. PYLORI ANTIBODY, IGG     Status: Normal   Collection Time   04/22/11 11:00 AM      Component Value Range Comment   H Pylori IgG 0.46     HEMOGLOBIN AND HEMATOCRIT, BLOOD     Status: Abnormal   Collection Time   04/22/11  3:35 PM      Component Value Range Comment   Hemoglobin 8.0 (*) 13.0 - 17.0 (g/dL)    HCT 91.4 (*) 78.2 - 52.0 (%)   BASIC METABOLIC PANEL     Status: Abnormal   Collection Time   04/22/11  3:35 PM      Component Value Range Comment   Sodium 137  135 - 145 (mEq/L)    Potassium 4.5  3.5 - 5.1 (mEq/L)    Chloride 107  96 - 112 (mEq/L)    CO2 21  19 - 32 (mEq/L)    Glucose, Bld 131 (*) 70 - 99 (mg/dL)    BUN 43 (*) 6 - 23 (mg/dL)    Creatinine, Ser 9.56 (*) 0.50 - 1.35 (mg/dL)    Calcium 8.4  8.4 - 10.5 (mg/dL)    GFR calc non Af Amer 40 (*) >90 (mL/min)    GFR calc Af  Amer 47 (*) >90 (mL/min)   PREPARE RBC (CROSSMATCH)     Status: Normal   Collection Time   04/22/11  5:30 PM      Component Value Range Comment   Order Confirmation ORDER PROCESSED BY BLOOD BANK     HEMOGLOBIN AND HEMATOCRIT, BLOOD     Status: Abnormal   Collection Time   04/23/11  2:00 AM      Component Value Range Comment   Hemoglobin 8.3 (*) 13.0 - 17.0 (g/dL)    HCT 21.3 (*) 08.6 - 52.0 (%)   CBC     Status: Abnormal   Collection Time   04/23/11  4:45 AM      Component Value Range Comment   WBC 12.3 (*) 4.0 - 10.5 (  K/uL)    RBC 2.85 (*) 4.22 - 5.81 (MIL/uL)    Hemoglobin 8.3 (*) 13.0 - 17.0 (g/dL)    HCT 16.1 (*) 09.6 - 52.0 (%)    MCV 86.7  78.0 - 100.0 (fL)    MCH 29.1  26.0 - 34.0 (pg)    MCHC 33.6  30.0 - 36.0 (g/dL)    RDW 04.5 (*) 40.9 - 15.5 (%)    Platelets 382  150 - 400 (K/uL)   COMPREHENSIVE METABOLIC PANEL     Status: Abnormal   Collection Time   04/23/11  4:45 AM      Component Value Range Comment   Sodium 135  135 - 145 (mEq/L)    Potassium 4.4  3.5 - 5.1 (mEq/L)    Chloride 107  96 - 112 (mEq/L)    CO2 22  19 - 32 (mEq/L)    Glucose, Bld 96  70 - 99 (mg/dL)    BUN 40 (*) 6 - 23 (mg/dL)    Creatinine, Ser 8.11 (*) 0.50 - 1.35 (mg/dL)    Calcium 8.0 (*) 8.4 - 10.5 (mg/dL)    Total Protein 5.4 (*) 6.0 - 8.3 (g/dL)    Albumin 1.7 (*) 3.5 - 5.2 (g/dL)    AST 42 (*) 0 - 37 (U/L)    ALT 23  0 - 53 (U/L)    Alkaline Phosphatase 63  39 - 117 (U/L)    Total Bilirubin 0.5  0.3 - 1.2 (mg/dL)    GFR calc non Af Amer 41 (*) >90 (mL/min)    GFR calc Af Amer 48 (*) >90 (mL/min)   PREPARE RBC (CROSSMATCH)     Status: Normal   Collection Time   04/23/11  8:00 AM      Component Value Range Comment   Order Confirmation ORDER PROCESSED BY BLOOD BANK     HEMOGLOBIN AND HEMATOCRIT, BLOOD     Status: Abnormal   Collection Time   04/23/11  3:30 PM      Component Value Range Comment   Hemoglobin 8.5 (*) 13.0 - 17.0 (g/dL)    HCT 91.4 (*) 78.2 - 52.0 (%)   LIPASE, BLOOD      Status: Abnormal   Collection Time   04/23/11  3:30 PM      Component Value Range Comment   Lipase 82 (*) 11 - 59 (U/L)   PROTIME-INR     Status: Abnormal   Collection Time   04/23/11  3:30 PM      Component Value Range Comment   Prothrombin Time 17.3 (*) 11.6 - 15.2 (seconds)    INR 1.39  0.00 - 1.49        Component Value Date/Time   SDES LEG LEFT CALF ABCESS 04/19/2011 2152   SPECREQUEST NONE 04/19/2011 2152   CULT NO ANAEROBES ISOLATED; CULTURE IN PROGRESS FOR 5 DAYS 04/19/2011 2152   REPTSTATUS PENDING 04/19/2011 2152   No results found.  Thank you so much for this interesting consult,   Johny Sax [phone] 04/23/2011, 4:52 PM

## 2011-04-24 ENCOUNTER — Encounter (HOSPITAL_COMMUNITY)
Admission: EM | Disposition: A | Discharge status: Home Health | Payer: Self-pay | Source: Home / Self Care | Attending: Internal Medicine

## 2011-04-24 DIAGNOSIS — L03119 Cellulitis of unspecified part of limb: Principal | ICD-10-CM

## 2011-04-24 LAB — ANAEROBIC CULTURE

## 2011-04-24 LAB — TYPE AND SCREEN
Antibody Screen: NEGATIVE
Unit division: 0
Unit division: 0
Unit division: 0
Unit division: 0

## 2011-04-24 LAB — BODY FLUID CULTURE

## 2011-04-24 LAB — COMPREHENSIVE METABOLIC PANEL
ALT: 33 U/L (ref 0–53)
AST: 61 U/L — ABNORMAL HIGH (ref 0–37)
CO2: 21 mEq/L (ref 19–32)
Calcium: 8.2 mg/dL — ABNORMAL LOW (ref 8.4–10.5)
Chloride: 105 mEq/L (ref 96–112)
GFR calc non Af Amer: 46 mL/min — ABNORMAL LOW (ref >90)
Sodium: 133 mEq/L — ABNORMAL LOW (ref 135–145)
Total Bilirubin: 0.8 mg/dL (ref 0.3–1.2)

## 2011-04-24 LAB — CBC
Platelets: 355 10*3/uL (ref 150–400)
RBC: 3.7 MIL/uL — ABNORMAL LOW (ref 4.22–5.81)
WBC: 10.6 10*3/uL — ABNORMAL HIGH (ref 4.0–10.5)

## 2011-04-24 LAB — MRSA PCR SCREENING: MRSA by PCR: NEGATIVE

## 2011-04-24 LAB — GLUCOSE, CAPILLARY: Glucose-Capillary: 83 mg/dL (ref 70–99)

## 2011-04-24 SURGERY — EGD (ESOPHAGOGASTRODUODENOSCOPY)
Anesthesia: Moderate Sedation

## 2011-04-24 MED ORDER — SODIUM CHLORIDE 0.9 % IJ SOLN
10.0000 mL | INTRAMUSCULAR | Status: DC | PRN
  Administered 2011-04-25 – 2011-04-28 (×6): 10 mL

## 2011-04-24 NOTE — Progress Notes (Signed)
INFECTIOUS DISEASE PROGRESS NOTE ID: Joe [name] is a 76 y.o. male with   Active Problems:  Cellulitis of leg  Atrial fibrillation  Hematemesis  Anemia, posthemorrhagic, acute  Upper GI bleed  Subjective: Feels poorly. No further emesis. Has not had breakfast.  LLE feels hot  Abtx:  Anti-infectives     Start     Dose/Rate Route Frequency Ordered Stop   04/23/11 2200   ceFAZolin (ANCEF) IVPB 1 g/50 mL premix        1 g 100 mL/hr over 30 Minutes Intravenous Every 8 hours 04/23/11 1733     04/23/11 2000   rifampin (RIFADIN) capsule 300 mg        300 mg Oral Daily 04/23/11 1733     04/22/11 1200   vancomycin (VANCOCIN) IVPB 1000 mg/200 mL premix  Status:  Discontinued        1,000 mg 200 mL/hr over 60 Minutes Intravenous Every 24 hours 04/21/11 1306 04/23/11 1733   04/19/11 1200   vancomycin (VANCOCIN) 1,250 mg in sodium chloride 0.9 % 250 mL IVPB  Status:  Discontinued        1,250 mg 166.7 mL/hr over 90 Minutes Intravenous Every 24 hours 04/18/11 1111 04/21/11 1306   04/18/11 1400   piperacillin-tazobactam (ZOSYN) IVPB 3.375 g  Status:  Discontinued        3.375 g 12.5 mL/hr over 240 Minutes Intravenous 3 times per day 04/18/11 1111 04/23/11 1733   04/18/11 1200   vancomycin (VANCOCIN) 1,500 mg in sodium chloride 0.9 % 500 mL IVPB        1,500 mg 250 mL/hr over 120 Minutes Intravenous  Once 04/18/11 1111 04/18/11 1403   04/16/11 1138   ceFAZolin (ANCEF) IVPB 2 g/50 mL premix  Status:  Discontinued        2 g 100 mL/hr over 30 Minutes Intravenous 60 min pre-op 04/16/11 1138 04/16/11 1654   04/16/11 0800   ceFAZolin (ANCEF) IVPB 1 g/50 mL premix        1 g 100 mL/hr over 30 Minutes Intravenous 3 times per day 04/16/11 0609 04/18/11 0359   04/15/11 1200   vancomycin (VANCOCIN) IVPB 1000 mg/200 mL premix        1,000 mg 200 mL/hr over 60 Minutes Intravenous  Once 04/15/11 1131 04/15/11 1600   04/15/11 1200  piperacillin-tazobactam (ZOSYN) IVPB 3.375 g       3.375  g 12.5 mL/hr over 240 Minutes Intravenous  Once 04/15/11 1131 04/15/11 1751          Medications:  Scheduled:   . acetaminophen  650 mg Rectal Once  . ALPRAZolam  0.25 mg Oral Daily  . atorvastatin  40 mg Oral Daily  .  ceFAZolin (ANCEF) IV  1 g Intravenous Q8H  . isosorbide mononitrate  60 mg Oral Daily  . lisinopril  10 mg Oral Daily  . perflutren lipid microspheres  10 microL/kg Intravenous Once  . rifampin  300 mg Oral Daily  . Tamsulosin HCl  0.4 mg Oral QPC breakfast  . DISCONTD: piperacillin-tazobactam (ZOSYN)  IV  3.375 g Intravenous Q8H  . DISCONTD: vancomycin  1,000 mg Intravenous Q24H    Objective: Vital signs in last 24 hours: Temp:  [97.3 F (36.3 C)-98.5 F (36.9 C)] 98.4 F (36.9 C) (04/04 0800) Pulse Rate:  [60-81] 73  (04/04 0400) Resp:  [15-22] 20  (04/04 0400) BP: (98-135)/(44-85) 135/57 mmHg (04/04 0400) SpO2:  [98 %-100 %] 99 % (04/04 0400)  General appearance: alert, cooperative and no distress Resp: clear to auscultation bilaterally Cardio: regular rate and rhythm GI: normal findings: bowel sounds normal and soft, non-tender Extremities: LLE is warm, swollen at knee. wound is dressed.   Lab Results  Basename 04/24/11 0325 04/23/11 1530 04/23/11 0445  WBC 10.6* -- 12.3*  HGB 10.8* 8.5* --  HCT 31.6* 25.3* --  NA 133* -- 135  K 3.7 -- 4.4  CL 105 -- 107  CO2 21 -- 22  BUN 27* -- 40*  CREATININE 1.47* -- 1.59*  GLU -- -- --   Liver Panel  Basename 04/24/11 0325 04/23/11 0445  PROT 5.5* 5.4*  ALBUMIN 1.8* 1.7*  AST 61* 42*  ALT 33 23  ALKPHOS 68 63  BILITOT 0.8 0.5  BILIDIR -- --  IBILI -- --   Sedimentation Rate  Basename 04/23/11 1820  ESRSEDRATE 80*   C-Reactive Protein  Basename 04/23/11 1820  CRP 1.61*    Microbiology: Recent Results (from the past 240 hour(s))  CULTURE, BLOOD (ROUTINE X 2)     Status: Normal   Collection Time   04/15/11 12:30 PM      Component Value Range Status Comment   Specimen Description  BLOOD RIGHT ARM   Final    Special Requests BOTTLES DRAWN AEROBIC ONLY 3CC   Final    Culture  Setup Time [id number]   Final    Culture NO GROWTH 5 DAYS   Final    Report Status 04/21/2011 FINAL   Final   BODY FLUID CULTURE     Status: Normal   Collection Time   04/19/11  9:47 PM      Component Value Range Status Comment   Specimen Description SYNOVIAL LEFT INTRAARTICULAR TOTAL KNEE FLUID   Final    Special Requests NONE   Final    Gram Stain     Final    Value: WBC PRESENT, PREDOMINANTLY PMN     NO ORGANISMS SEEN   Culture     Final    Value: FEW STAPHYLOCOCCUS SPECIES (COAGULASE NEGATIVE)     Note: CRITICAL RESULT CALLED TO, READ BACK BY AND VERIFIED WITH: CALLED TO JENNIFER @1157  4213 BY KRAWS     RARE STREPTOCOCCUS GROUP G   Report Status 04/24/2011 FINAL   Final    Organism ID, Bacteria STAPHYLOCOCCUS SPECIES (COAGULASE NEGATIVE)   Final    Organism ID, Bacteria STREPTOCOCCUS GROUP G   Final   ANAEROBIC CULTURE     Status: Normal (Preliminary result)   Collection Time   04/19/11  9:49 PM      Component Value Range Status Comment   Specimen Description SYNOVIAL LEFT INTRAARTICULAR TOTAL KNEE FLUID   Final    Special Requests NONE   Final    Gram Stain     Final    Value: WBC PRESENT, PREDOMINANTLY PMN     NO ORGANISMS SEEN   Culture     Final    Value: NO ANAEROBES ISOLATED; CULTURE IN PROGRESS FOR 5 DAYS   Report Status PENDING   Incomplete   CULTURE, ROUTINE-ABSCESS     Status: Normal   Collection Time   04/19/11  9:50 PM      Component Value Range Status Comment   Specimen Description LEG LEFT CALF ABCESS   Final    Special Requests NONE   Final    Gram Stain     Final    Value: MODERATE WBC PRESENT, PREDOMINANTLY PMN  NO SQUAMOUS EPITHELIAL CELLS SEEN     NO ORGANISMS SEEN   Culture NO GROWTH 3 DAYS   Final    Report Status 04/23/2011 FINAL   Final   ANAEROBIC CULTURE     Status: Normal (Preliminary result)   Collection Time   04/19/11  9:52 PM      Component  Value Range Status Comment   Specimen Description LEG LEFT CALF ABCESS   Final    Special Requests NONE   Final    Gram Stain     Final    Value: FEW WBC PRESENT, PREDOMINANTLY PMN     NO SQUAMOUS EPITHELIAL CELLS SEEN     NO ORGANISMS SEEN   Culture     Final    Value: NO ANAEROBES ISOLATED; CULTURE IN PROGRESS FOR 5 DAYS   Report Status PENDING   Incomplete   MRSA PCR SCREENING     Status: Normal   Collection Time   04/24/11  4:39 AM      Component Value Range Status Comment   MRSA by PCR NEGATIVE  NEGATIVE  Final     Studies/Results: No results found.   Assessment/Plan: Calf Abcess L TKR infection (MSSE) UGI bleed Elevated Cr  Day 10 anbx (ancef/rifampin) Appears to be tolerating anbx well.  Will continue to watch.  Cr is improving.     Johny Sax Infectious Diseases [phone] 04/24/2011, 8:54 AM

## 2011-04-24 NOTE — Progress Notes (Addendum)
Report given to Tess, plan for endo this am to eval and treat GI bleed, then transfer to 1423. Several small tarry blackish brown stools per bedpan this am, remains on protonix drip at 8mg /hr. 2 units PRBC transfused on night shift. Hgb 10.8. Received a total of 10 units of PRBC since admission per report, bp stable.  1100 Called endoscopy, procedure d/c'd. Transferred to 1423, remains on protonix drip.

## 2011-04-24 NOTE — Progress Notes (Signed)
Subjective: Looks much better this morning he remains afebrile.Dressing changed and wound looks fine.No drainage and negative C&S of leg,but Few Staph in Knee fluid.lled his wife and discussed case with her as well as the patient and we will try to treat with Antibiotics at this point. Infectious Disease was called and will evaluate as well. He had a Upper GI bleed and is in no condition to do any major surgery such as removal of prosthesis. I discussed the case with my associate,Dr. Charlann Boxer and we both agree on thi approach. I did explain to Ulis and his wife that if this doesn't work,we made need to do Major Surgery later,but certainly not now under his present condition. He agrees with our approach. His left knee is not warm and no erythema is present. He feels that he may have loosened the Prosthesis by the numerous insults to his knee getting on and off the Tractor with this leg first.He has remained afebrile and his WBC is returning to normal.   Objective: Vital signs in last 24 hours: Temp:  [97.3 F (36.3 C)-98.5 F (36.9 C)] 97.6 F (36.4 C) (04/04 0400) Pulse Rate:  [60-81] 73  (04/04 0400) Resp:  [15-22] 20  (04/04 0400) BP: (98-135)/(44-85) 135/57 mmHg (04/04 0400) SpO2:  [98 %-100 %] 99 % (04/04 0400)  Intake/Output from previous day: 04/03 0701 - 04/04 0700 In: 4537.5 [I.V.:2975; Blood:1187.5; IV Piggyback:375] Out: 1600 [Urine:1600] Intake/Output this shift:     Basename 04/24/11 0325 04/23/11 1530 04/23/11 0445 04/23/11 0200 04/22/11 1535  HGB 10.8* 8.5* 8.3* 8.3* 8.0*    Basename 04/24/11 0325 04/23/11 1530 04/23/11 0445  WBC 10.6* -- 12.3*  RBC 3.70* -- 2.85*  HCT 31.6* 25.3* --  PLT 355 -- 382    Basename 04/24/11 0325 04/23/11 0445  NA 133* 135  K 3.7 4.4  CL 105 107  CO2 21 22  BUN 27* 40*  CREATININE 1.47* 1.59*  GLUCOSE 89 96  CALCIUM 8.2* 8.0*    Basename 04/24/11 0325 04/23/11 1530  LABPT -- --  INR 1.18 1.39    Neurologically  intact Neurovascular intact Dorsiflexion/Plantar flexion intact No cellulitis present  Assessment/Plan: Will follow with conservative approach and wait on Infectious Disease and for his overall condition to Stabilize.    Raymir Frommelt A 04/24/2011, 8:35 AM

## 2011-04-24 NOTE — Progress Notes (Signed)
SUBJECTIVE:  No complaints  OBJECTIVE:   Vitals:   Filed Vitals:   04/24/11 0400 04/24/11 0800 04/24/11 0817 04/24/11 1108  BP: 135/57  149/63 156/74  Pulse: 73  76 73  Temp: 97.6 F (36.4 C) 98.4 F (36.9 C)  97.9 F (36.6 C)  TempSrc: Oral Oral  Oral  Resp: 20  17 18   Height:      Weight:      SpO2: 99%  99% 98%   I&O's:   Intake/Output Summary (Last 24 hours) at 04/24/11 1118 Last data filed at 04/24/11 1100  Gross per 24 hour  Intake   3850 ml  Output   1404 ml  Net   2446 ml   TELEMETRY: Reviewed telemetry pt in :     PHYSICAL EXAM General: Well developed, well nourished, in no acute distress Head: Eyes PERRLA, No xanthomas.   Normal cephalic and atramatic  Lungs:   Clear bilaterally to auscultation and percussion. Heart:   HRRR S1 S2 Pulses are 2+ & equal.  Occasional ectopy            No carotid bruit. No JVD.  No abdominal bruits. No femoral bruits. Abdomen: Bowel sounds are positive, abdomen soft and non-tender without masses Extremities:   No clubbing, cyanosis or edema.  DP +1 Neuro: Alert and oriented X 3. Psych:  Good affect, responds appropriately   LABS: Basic Metabolic Panel:  Basename 04/24/11 0325 04/23/11 0445  NA 133* 135  K 3.7 4.4  CL 105 107  CO2 21 22  GLUCOSE 89 96  BUN 27* 40*  CREATININE 1.47* 1.59*  CALCIUM 8.2* 8.0*  MG -- --  PHOS -- --   Liver Function Tests:  [facility] 04/24/11 0325 04/23/11 0445  AST 61* 42*  ALT 33 23  ALKPHOS 68 63  BILITOT 0.8 0.5  PROT 5.5* 5.4*  ALBUMIN 1.8* 1.7*    Basename 04/23/11 1530 04/22/11 0507  LIPASE 82* 714*  AMYLASE -- --   CBC:  Basename 04/24/11 0325 04/23/11 1530 04/23/11 0445  WBC 10.6* -- 12.3*  NEUTROABS -- -- --  HGB 10.8* 8.5* --  HCT 31.6* 25.3* --  MCV 85.4 -- 86.7  PLT 355 -- 382   Coag Panel:   Lab Results  Component Value Date   INR 1.18 04/24/2011   INR 1.39 04/23/2011   INR 1.55* 04/22/2011    RADIOLOGY: X-ray Chest Pa And Lateral   04/15/2011   *RADIOLOGY REPORT*  Clinical Data: Elevated white blood cell count.  Weakness.  CHEST - 2 VIEW  Comparison: 11/12/2006  Findings: Prior median sternotomy.  Cardiomegaly.  Lungs are clear. No effusions.  No acute bony abnormality.  IMPRESSION: Cardiomegaly.  No active cardiopulmonary disease.  Original Report Authenticated By: Cyndie Chime, M.D.   Ct Tibia Fibula Left Wo Contrast  04/18/2011  *RADIOLOGY REPORT*  Clinical Data: Leg redness.  Evaluate for abscess.  CT TIBIA FIBULA LEFT WITHOUT CONTRAST  Technique:  Contiguous noncontrast axial images were obtained of the left leg from both the knee to below the ankle.  Coronal and sagittal reconstructions were created in bone and soft tissue windows.  Comparison: 04/15/2011.  Findings: Please note that noncontrast technique gives suboptimal visualization of vascular/enhancement of fluid.  Within this limitation, there is pronounced infiltration of the dorsal subcutaneous tissues of the leg.  Additionally, in the posterior compartment, interposed between the medial head of the gastrocnemius and the soleus muscle, there is a focal fluid collection with a tiny  locule of gas highly suspicious for multi locular abscess.  On axial image number 65, this measures 78 mm AP by 41 mm transverse.  Craniocaudal extent is 17 cm.  Medial leg varicosities are present with phleboliths.  Severe below-the-knee atherosclerosis.  The bones demonstrate osteolysis around the cemented Constrained knee arthroplasty which may represent chronic infection or particle disease.  Ordinary mechanical loosening is considered less likely.  This is most pronounced around the patellar component and femoral component.  There is also lucency around the bone cement in the tibial plateau.  IMPRESSION:  1.  Elongated medial posterior compartment fluid collection with gas locules suspicious for deep abscess interposed between the medial head gastrocnemius and the soleus muscle. 2.  Severe subcutaneous  edema/fluid. 3.  Osteolysis around the total knee arthroplasty, likely representing either chronic infection or particle disease, with mechanical loosening considered unlikely.  Original Report Authenticated By: Andreas Newport, M.D.   Dg Chest Port 1 View  04/20/2011  *RADIOLOGY REPORT*  Clinical Data: Line placement.  PORTABLE CHEST - 1 VIEW  Comparison: 04/15/2011.  Findings: There is a new right upper extremity PICC.  The tip is poorly visualized but appears to extend to at least the mid SVC. The position of the tip is not definitely visualized.  The lung volumes remain low with pulmonary vascular congestion and basilar atelectasis.  Cardiomegaly remains present.  IMPRESSION: New right upper extremity PICC.  The tip is not well visualized due to overlapping structures.  The tip is at least in the mid SVC.  A repeat PA chest radiograph with full inspiration may better demonstrate the position of the PICC line tip.  Radiology department radiographs are typically better than portable radiographs.  Otherwise unchanged appearance of the chest.  Original Report Authenticated By: Andreas Newport, M.D.   Dg Knee Left Port  04/15/2011  *RADIOLOGY REPORT*  Clinical Data: Left knee pain.  History of knee replacement.  PORTABLE LEFT KNEE - 1-2 VIEW  Comparison: Plain films 11/18/2006.  Findings: The patient has a cemented and constrained total knee arthroplasty.  The device is located.  There is lucency at the bone cement interface subjacent to the lateral plateaus and medial femoral condyle. Bone cement interface in the tibial diaphysis is unremarkable.  No acute fracture is identified.  The patient has an old patellar fracture.  No joint effusion.  IMPRESSION:  1.  Status post left total knee replacement.  Lucency at the bone cement interface at the level of tibial metaphyses and medial femoral condyle is suggestive of loosening. Note is again made that no lucency about the tibial stem is seen. 2.  Old patellar  fracture.  Original Report Authenticated By: Bernadene Bell. D'ALESSIO, M.D.      ASSESSMENT:  1.  Chronic Atrial fibrillation with no further bradycardia 2.  Transient high grade AV block felt secondary to vasovagal episode while NG tube in place - resolved 3.  Upper GI bleed 4.  LE abscess drained  PLAN:   1.  Continue current medical therapy  Quintella Reichert, MD  04/24/2011  11:18 AM

## 2011-04-24 NOTE — Progress Notes (Signed)
Patient seen, examined, and I agree with the above documentation, including the assessment and plan. Transferred to and seen on the floor. Feels better today, very small black stools twice today. No n/v Hgb up nicely after 2 more units of pRBC last evening. Need to continue to following clinically and Hgb.  If Hgb continues to drop or he requires more transfusions, then I recommend repeat EGD. This was explained to the patient and he understands and agrees with this plan. INR down further, which will only help at this point. Continue PPI gtt for now.

## 2011-04-24 NOTE — Evaluation (Signed)
Physical Therapy Evaluation Patient Details Name: Joe [name] MRN: [id number] DOB: 15-May-1935 Today's Date: 04/24/2011  Problem List:  Patient Active Problem List  Diagnoses  . Cellulitis of leg  . Atrial fibrillation  . Hematemesis  . Anemia, posthemorrhagic, acute  . Upper GI bleed    Past Medical History:  Past Medical History  Diagnosis Date  . Hypertension   . Hypercholesteremia   . Anxiety   . Obesity (BMI 35.0-39.9 without comorbidity)    Past Surgical History:  Past Surgical History  Procedure Date  . Coronary artery bypass graft   . Joint replacement   . Foot surgery   . Leg surgery   . Appendectomy   . I&d extremity 04/19/2011    Procedure: IRRIGATION AND DEBRIDEMENT EXTREMITY;  Surgeon: Eugenia Mcalpine, MD;  Location: [facility];  Service: Orthopedics;  Laterality: Left;  aspiration of left total knee fluid and irrigation and debridement of left calf abscess  . Esophagogastroduodenoscopy 04/21/2011    Procedure: ESOPHAGOGASTRODUODENOSCOPY (EGD);  Surgeon: Beverley Fiedler, MD;  Location: [facility];  Service: Gastroenterology;  Laterality: N/A;  . Esophagogastroduodenoscopy 04/22/2011    Procedure: ESOPHAGOGASTRODUODENOSCOPY (EGD);  Surgeon: Beverley Fiedler, MD;  Location: [facility];  Service: Gastroenterology;  Laterality: N/A;  travel case    PT Assessment/Plan/Recommendation PT Assessment Clinical Impression Statement: pt is being reevaluated today after PT order was discontinued 04/21/11.  pt has more recenr H/o GI Bleed, afib, s/p I&D L lower leg.. pt was participatory today but limited by pain, fatigue. pt will benefit from PT to improve strength, functional mobility, ROM to Discharge to home vs. next venue. pt wants to DC home. PT Recommendation/Assessment: Patient will need skilled PT in the acute care venue PT Problem List: Decreased strength;Decreased activity tolerance;Pain;Obesity PT Therapy Diagnosis : Difficulty walking;Acute pain;Generalized weakness PT  Plan PT Frequency: Min 3X/week PT Treatment/Interventions: DME instruction;Gait training;Functional mobility training;Therapeutic activities;Therapeutic exercise;Stair training;Patient/family education PT Recommendation Recommendations for Other Services: OT consult Follow Up Recommendations: Home health PT;Inpatient Rehab (pt may require post acute rehab to achieve  mobility) Equipment Recommended:  (pt sated he has RW and WC and chair lift) PT Goals  Acute Rehab PT Goals PT Goal Formulation: With patient Time For Goal Achievement: 2 weeks Pt will go Supine/Side to Sit: with supervision PT Goal: Supine/Side to Sit - Progress: Goal set today Pt will go Sit to Supine/Side: with supervision PT Goal: Sit to Supine/Side - Progress: Goal set today Pt will go Sit to Stand: with min assist PT Goal: Sit to Stand - Progress: Goal set today Pt will Transfer Bed to Chair/Chair to Bed: with min assist PT Transfer Goal: Bed to Chair/Chair to Bed - Progress: Goal set today  PT Evaluation Precautions/Restrictions    Prior Functioning  Home Living Lives With: Spouse Receives Help From: Family Type of Home: House Home Layout: One level Home Access: Ramped entrance;Stairs to enter [employer] Shower/Tub: Walk-in shower Home Adaptive Equipment: Straight cane;Walker - rolling;Wheelchair - manual (lift chair) Prior Function Level of Independence: Independent with transfers;Independent with gait Cognition Cognition Arousal/Alertness: Awake/alert Overall Cognitive Status: Appears within functional limits for tasks assessed Orientation Level: Oriented X4 Sensation/Coordination Sensation Light Touch:  (pt has hypersensitive to touch but pt was able to rub his le) Additional Comments: pt states that LLE is burning  Coordination Gross Motor Movements are Fluid and Coordinated: Yes Extremity Assessment RUE Assessment RUE Assessment: Within Functional Limits LUE Assessment LUE Assessment: Within  Functional Limits RLE Assessment RLE Assessment: Within Functional Limits  LLE Assessment LLE Assessment: Exceptions to WFL LLE AROM (degrees) LLE Overall AROM Comments: pt tolered knee flex. while sitting on edge. LLE Strength LLE Overall Strength Comments: pt able to SLR from bed and did place some weight on leg w/ standing attempts Mobility (including Balance) Bed Mobility Bed Mobility: Yes Supine to Sit: 4: Min assist Supine to Sit Details (indicate cue type and reason): pt had some difficulty moving on soft mattress and use d rail., able to move both legs to edge Sit to Supine: 4: Min assist Sit to Supine - Details (indicate cue type and reason): pt able to get self back onto bed. Transfers Transfers: Yes Sit to Stand: 1: +2 Total assist;From elevated surface;With upper extremity assist;From bed. Pt was then able to semi stand and scoot self up towards HOB Sit to Stand Details (indicate cue type and reason): pt able to stand but only for 30 secs, c/o pain of LLE Stand to Sit: 3: Mod assist;Without upper extremity assist Stand to Sit Details: pt needed to sit down quickly, no control of descent Ambulation/Gait Ambulation/Gait: No  Posture/Postural Control Posture/Postural Control: No significant limitations Exercise    End of Session PT - End of Session Activity Tolerance: Patient limited by fatigue;Patient limited by pain Patient left: in bed;with call bell in reach Nurse Communication: Mobility status for transfers General Behavior During Session: [facility] for tasks performed Cognition: [facility] for tasks performed  Rada Hay 04/24/2011, 2:53 PM  [phone]

## 2011-04-24 NOTE — Progress Notes (Signed)
Subjective: Small black BM's overnight, a lot less volume.  No other active issues.  Has been seen by ID regarding his knee (Coag neg staph) and ABX have been changed.  Objective: Vital signs in last 24 hours: Temp:  [97.3 F (36.3 C)-98.5 F (36.9 C)] 97.6 F (36.4 C) (04/04 0400) Pulse Rate:  [60-82] 73  (04/04 0400) Resp:  [15-22] 20  (04/04 0400) BP: (98-135)/(44-85) 135/57 mmHg (04/04 0400) SpO2:  [98 %-100 %] 99 % (04/04 0400) Weight change:  Last BM Date: 04/22/11  Intake/Output from previous day: 04/03 0701 - 04/04 0700 In: 4537.5 [I.V.:2975; Blood:1187.5; IV Piggyback:375] Out: 1600 [Urine:1600] Intake/Output this shift:   General appearance: alert, cooperative and appears stated age  Neck: no adenopathy, no carotid bruit, no JVD, supple, symmetrical, trachea midline and thyroid not enlarged, symmetric, no tenderness/mass/nodules  Resp: clear to auscultation bilaterally  Cardio: rate in the 80's, stable  GI: soft, non-tender; bowel sounds normal; no masses, no organomegaly  Skin: Skin color, texture, turgor normal. No rashes or lesions  Neurologic: Grossly normal   Lab Results:  Basename 04/24/11 0325 04/23/11 1530 04/23/11 0445  WBC 10.6* -- 12.3*  HGB 10.8* 8.5* --  HCT 31.6* 25.3* --  PLT 355 -- 382   BMET  Basename 04/24/11 0325 04/23/11 0445  NA 133* 135  K 3.7 4.4  CL 105 107  CO2 21 22  GLUCOSE 89 96  BUN 27* 40*  CREATININE 1.47* 1.59*  CALCIUM 8.2* 8.0*    Studies/Results: No results found.  Medications:  I have reviewed the patient's current medications. Scheduled:   . acetaminophen  650 mg Rectal Once  . acetaminophen  650 mg Oral Once  . ALPRAZolam  0.25 mg Oral Daily  . atorvastatin  40 mg Oral Daily  .  ceFAZolin (ANCEF) IV  1 g Intravenous Q8H  . isosorbide mononitrate  60 mg Oral Daily  . lisinopril  10 mg Oral Daily  . perflutren lipid microspheres  10 microL/kg Intravenous Once  . phytonadione (VITAMIN K) IV  5 mg  Intravenous Once  . rifampin  300 mg Oral Daily  . Tamsulosin HCl  0.4 mg Oral QPC breakfast  . DISCONTD: piperacillin-tazobactam (ZOSYN)  IV  3.375 g Intravenous Q8H  . DISCONTD: vancomycin  1,000 mg Intravenous Q24H   Continuous:   . sodium chloride 100 mL/hr at 04/24/11 0150  . pantoprozole (PROTONIX) infusion 8 mg/hr (04/23/11 2346)  . phenylephrine (NEO-SYNEPHRINE) Adult infusion Stopped (04/21/11 0358)   MWU:[id number], acetaminophen, ALPRAZolam, bisacodyl, fentaNYL, midazolam, ondansetron (ZOFRAN) IV, ondansetron, oxyCODONE-acetaminophen, polyethylene glycol, traMADol, zolpidem  Assessment/Plan: 1) Cellulitis L leg. Cephalosporin + Rifampin per ID recommendations 2) UGIB- Followed by GI On IV PPI, will keep on another day.  No decrease in H+H in past 24 hours.  WIll transfer to tele floor 4) Bradycardia. Seen by Cards, thought to be vasovagal in nature. Atropine at bedside, but watch and wait approach. No indication for pacing. Has not recurred. 5) Renal insufficiency- Still improving with volume 6) Hyperkalemia- resolved  7) Anticoagulation held due to GI bleed.  8) Notes difficulty with urination. Flomax added Will transfer to Tele floor today.  LOS: 9 days   Katey Barrie W 04/24/2011, 7:57 AM

## 2011-04-24 NOTE — Progress Notes (Cosign Needed)
Please be advised that the VAS Team received an order for a PICC line to be placed on 4/4. There was an order placed on 3/31 and a double lumen PICC was placed on that day. The patient will be able to receive home therapies with this access. If there are other concerns, please feel free to contact our department, [phone]. Stacie Glaze, RN

## 2011-04-24 NOTE — Progress Notes (Signed)
[facility] Progress Note  SUBJECTIVE: Feels okay, "I think bleeding has about stopped". He has had 2 black stools this am.   OBJECTIVE:  Vital signs in last 24 hours: Temp:  [97.3 F (36.3 C)-98.5 F (36.9 C)] 98.4 F (36.9 C) (04/04 0800) Pulse Rate:  [60-81] 73  (04/04 0400) Resp:  [15-22] 20  (04/04 0400) BP: (98-135)/(48-85) 135/57 mmHg (04/04 0400) SpO2:  [98 %-100 %] 99 % (04/04 0400) Last BM Date: 04/22/11 General:    Pleasant white male in NAD Heart:  Regular rate and rhythm Lungs: Respirations even and unlabored. No wheezes Abdomen:  Soft, nontender and nondistended. Normal bowel sounds. Neurologic:  Alert and oriented,  grossly normal neurologically. Psych:  Cooperative. Normal mood and affect.     Lab Results:  Basename 04/24/11 0325 04/23/11 1530 04/23/11 0445 04/22/11 0507  WBC 10.6* -- 12.3* 22.4*  HGB 10.8* 8.5* 8.3* --  HCT 31.6* 25.3* 24.7* --  PLT 355 -- 382 415*   BMET  Basename 04/24/11 0325 04/23/11 0445 04/22/11 1535  NA 133* 135 137  K 3.7 4.4 4.5  CL 105 107 107  CO2 21 22 21   GLUCOSE 89 96 131*  BUN 27* 40* 43*  CREATININE 1.47* 1.59* 1.63*  CALCIUM 8.2* 8.0* 8.4   LFT  Basename 04/24/11 0325  PROT 5.5*  ALBUMIN 1.8*  AST 61*  ALT 33  ALKPHOS 68  BILITOT 0.8  BILIDIR --  IBILI --   PT/INR  Basename 04/24/11 0325 04/23/11 1530  LABPROT 15.3* 17.3*  INR 1.18 1.39     ASSESSMENT / PLAN:  1. Upper GI bleed. EGD revealed blood clots in stomach and large clot in duodenal bulb but no active bleeding seen. He report 2 black stools this am. Hemoglobin up to 10.8 after 2 more units of blood last night. He has been hemodynamically stable. Bleeding seems to be finally slowing down but still observing closely. Continue PPI drip for now. Patient for transfer to telemetry floor this am.   2. Anemia of acute blood loss, hemoglobin up to 10.8 after total of 4 units of blood yesterday.   3. Elevated Lipase of 714 on 04/22/11. It  was 81 on recheck yesterday. He hasn't had any abdominal pain. Not sure of clinical significance.   5. Left calf abscess, s/p I&D  6. Elevated INR, Looking back in records, patient hot a 7.5mg  dose of coumadin on 04/15/11. Last INR on 4/2 was 1.55. He got more Vit K yesterday. INR normal at 1.18 today.      LOS: 9 days   Willette Cluster  04/24/2011, 9:35 AM

## 2011-04-25 ENCOUNTER — Encounter: Payer: Self-pay | Admitting: Internal Medicine

## 2011-04-25 LAB — COMPREHENSIVE METABOLIC PANEL
ALT: 24 U/L (ref 0–53)
AST: 43 U/L — ABNORMAL HIGH (ref 0–37)
Albumin: 1.9 g/dL — ABNORMAL LOW (ref 3.5–5.2)
Alkaline Phosphatase: 69 U/L (ref 39–117)
Chloride: 105 mEq/L (ref 96–112)
Potassium: 3.6 mEq/L (ref 3.5–5.1)
Sodium: 134 mEq/L — ABNORMAL LOW (ref 135–145)
Total Bilirubin: 0.5 mg/dL (ref 0.3–1.2)
Total Protein: 5.6 g/dL — ABNORMAL LOW (ref 6.0–8.3)

## 2011-04-25 LAB — CBC
HCT: 32.9 % — ABNORMAL LOW (ref 39.0–52.0)
Platelets: 356 10*3/uL (ref 150–400)
RDW: 15.4 % (ref 11.5–15.5)
WBC: 10.5 10*3/uL (ref 4.0–10.5)

## 2011-04-25 MED ORDER — PRO-STAT SUGAR FREE PO LIQD
30.0000 mL | Freq: Every morning | ORAL | Status: DC
  Administered 2011-04-25 – 2011-04-27 (×3): 30 mL via ORAL
  Filled 2011-04-25 (×4): qty 30

## 2011-04-25 MED ORDER — PANTOPRAZOLE SODIUM 40 MG PO TBEC
40.0000 mg | DELAYED_RELEASE_TABLET | Freq: Two times a day (BID) | ORAL | Status: DC
  Administered 2011-04-25 – 2011-04-28 (×7): 40 mg via ORAL
  Filled 2011-04-25 (×8): qty 1

## 2011-04-25 MED ORDER — LISINOPRIL 20 MG PO TABS
20.0000 mg | ORAL_TABLET | Freq: Every day | ORAL | Status: DC
  Administered 2011-04-25 – 2011-04-28 (×4): 20 mg via ORAL
  Filled 2011-04-25 (×4): qty 1

## 2011-04-25 NOTE — Progress Notes (Signed)
[facility] Progress Note  SUBJECTIVE: hungry. No further bleeding, just small amount of old blood.   OBJECTIVE:  Vital signs in last 24 hours: Temp:  [97.7 F (36.5 C)-98.3 F (36.8 C)] 97.7 F (36.5 C) (04/05 0540) Pulse Rate:  [64-74] 71  (04/05 0540) Resp:  [18] 18  (04/05 0540) BP: (140-156)/(62-81) 147/62 mmHg (04/05 0540) SpO2:  [93 %-99 %] 97 % (04/05 0540) Last BM Date: 04/24/11 General:    Pleasant white male in NAD Abdomen:  Soft, obese,nontender. Normal bowel sounds. Extremities:  Without edema. Neurologic:  Alert and oriented,  grossly normal neurologically. Psych:  Cooperative. Normal mood and affect.   Lab Results:  Basename 04/25/11 0340 04/24/11 0325 04/23/11 1530 04/23/11 0445  WBC 10.5 10.6* -- 12.3*  HGB 10.6* 10.8* 8.5* --  HCT 32.9* 31.6* 25.3* --  PLT 356 355 -- 382   BMET  Basename 04/25/11 0340 04/24/11 0325 04/23/11 0445  NA 134* 133* 135  K 3.6 3.7 4.4  CL 105 105 107  CO2 20 21 22   GLUCOSE 95 89 96  BUN 17 27* 40*  CREATININE 1.33 1.47* 1.59*  CALCIUM 8.1* 8.2* 8.0*   LFT  Basename 04/25/11 0340  PROT 5.6*  ALBUMIN 1.9*  AST 43*  ALT 24  ALKPHOS 69  BILITOT 0.5  BILIDIR --  IBILI --   PT/INR  Basename 04/24/11 0325 04/23/11 1530  LABPROT 15.3* 17.3*  INR 1.18 1.39    ASSESSMENT / PLAN:  1. Upper GI bleed. EGD revealed blood clots in stomach and large clot in duodenal bulb but no active bleeding seen. Hemoglobin has remained stable, the bleeding appears to have stopped. Will give him solids. PPI has already been changed from drip to BID. He may need follow up EGD in a few weeks.   2. Anemia of acute blood loss, hemoglobin stable at 10.6 .    LOS: 10 days   Willette Cluster  04/25/2011, 10:15 AM

## 2011-04-25 NOTE — Progress Notes (Signed)
INFECTIOUS DISEASE PROGRESS NOTE  ID: Joe [name] is a 76 y.o. male with   Active Problems:  Cellulitis of leg  Atrial fibrillation  Hematemesis  Anemia, posthemorrhagic, acute  Upper GI bleed  Subjective: Feels better. "i think they nicked my throat" that caused my bleeding. No further emesis.   Abtx:  Anti-infectives     Start     Dose/Rate Route Frequency Ordered Stop   04/23/11 2200   ceFAZolin (ANCEF) IVPB 1 g/50 mL premix        1 g 100 mL/hr over 30 Minutes Intravenous Every 8 hours 04/23/11 1733     04/23/11 2000   rifampin (RIFADIN) capsule 300 mg        300 mg Oral Daily 04/23/11 1733     04/22/11 1200   vancomycin (VANCOCIN) IVPB 1000 mg/200 mL premix  Status:  Discontinued        1,000 mg 200 mL/hr over 60 Minutes Intravenous Every 24 hours 04/21/11 1306 04/23/11 1733   04/19/11 1200   vancomycin (VANCOCIN) 1,250 mg in sodium chloride 0.9 % 250 mL IVPB  Status:  Discontinued        1,250 mg 166.7 mL/hr over 90 Minutes Intravenous Every 24 hours 04/18/11 1111 04/21/11 1306   04/18/11 1400   piperacillin-tazobactam (ZOSYN) IVPB 3.375 g  Status:  Discontinued        3.375 g 12.5 mL/hr over 240 Minutes Intravenous 3 times per day 04/18/11 1111 04/23/11 1733   04/18/11 1200   vancomycin (VANCOCIN) 1,500 mg in sodium chloride 0.9 % 500 mL IVPB        1,500 mg 250 mL/hr over 120 Minutes Intravenous  Once 04/18/11 1111 04/18/11 1403   04/16/11 1138   ceFAZolin (ANCEF) IVPB 2 g/50 mL premix  Status:  Discontinued        2 g 100 mL/hr over 30 Minutes Intravenous 60 min pre-op 04/16/11 1138 04/16/11 1654   04/16/11 0800   ceFAZolin (ANCEF) IVPB 1 g/50 mL premix        1 g 100 mL/hr over 30 Minutes Intravenous 3 times per day 04/16/11 0609 04/18/11 0359   04/15/11 1200   vancomycin (VANCOCIN) IVPB 1000 mg/200 mL premix        1,000 mg 200 mL/hr over 60 Minutes Intravenous  Once 04/15/11 1131 04/15/11 1600   04/15/11 1200  piperacillin-tazobactam (ZOSYN) IVPB 3.375  g       3.375 g 12.5 mL/hr over 240 Minutes Intravenous  Once 04/15/11 1131 04/15/11 1751          Medications:  Scheduled:   . acetaminophen  650 mg Rectal Once  . ALPRAZolam  0.25 mg Oral Daily  . atorvastatin  40 mg Oral Daily  .  ceFAZolin (ANCEF) IV  1 g Intravenous Q8H  . isosorbide mononitrate  60 mg Oral Daily  . lisinopril  20 mg Oral Daily  . pantoprazole  40 mg Oral BID AC  . rifampin  300 mg Oral Daily  . Tamsulosin HCl  0.4 mg Oral QPC breakfast  . DISCONTD: lisinopril  10 mg Oral Daily  . DISCONTD: perflutren lipid microspheres  10 microL/kg Intravenous Once    Objective: Vital signs in last 24 hours: Temp:  [97.7 F (36.5 C)-98.3 F (36.8 C)] 97.7 F (36.5 C) (04/05 0540) Pulse Rate:  [64-74] 71  (04/05 0540) Resp:  [18] 18  (04/05 0540) BP: (140-156)/(62-81) 147/62 mmHg (04/05 0540) SpO2:  [93 %-99 %] 97 % (04/05 0540)  General appearance: alert, cooperative and no distress Resp: clear to auscultation bilaterally Cardio: regular rate and rhythm GI: normal findings: bowel sounds normal and soft, non-tender Incision/Wound: clean, sutures in place. His LLE is swollen, warm.   Lab Results  Basename 04/25/11 0340 04/24/11 0325  WBC 10.5 10.6*  HGB 10.6* 10.8*  HCT 32.9* 31.6*  NA 134* 133*  K 3.6 3.7  CL 105 105  CO2 20 21  BUN 17 27*  CREATININE 1.33 1.47*  GLU -- --   Liver Panel  Basename 04/25/11 0340 04/24/11 0325  PROT 5.6* 5.5*  ALBUMIN 1.9* 1.8*  AST 43* 61*  ALT 24 33  ALKPHOS 69 68  BILITOT 0.5 0.8  BILIDIR -- --  IBILI -- --   Sedimentation Rate  Basename 04/23/11 1820  ESRSEDRATE 80*   C-Reactive Protein  Basename 04/23/11 1820  CRP 1.61*    Microbiology: Recent Results (from the past 240 hour(s))  CULTURE, BLOOD (ROUTINE X 2)     Status: Normal   Collection Time   04/15/11 12:30 PM      Component Value Range Status Comment   Specimen Description BLOOD RIGHT ARM   Final    Special Requests BOTTLES DRAWN  AEROBIC ONLY 3CC   Final    Culture  Setup Time [id number]   Final    Culture NO GROWTH 5 DAYS   Final    Report Status 04/21/2011 FINAL   Final   BODY FLUID CULTURE     Status: Normal   Collection Time   04/19/11  9:47 PM      Component Value Range Status Comment   Specimen Description SYNOVIAL LEFT INTRAARTICULAR TOTAL KNEE FLUID   Final    Special Requests NONE   Final    Gram Stain     Final    Value: WBC PRESENT, PREDOMINANTLY PMN     NO ORGANISMS SEEN   Culture     Final    Value: FEW STAPHYLOCOCCUS SPECIES (COAGULASE NEGATIVE)     Note: CRITICAL RESULT CALLED TO, READ BACK BY AND VERIFIED WITH: CALLED TO JENNIFER @1157  4213 BY KRAWS     RARE STREPTOCOCCUS GROUP G   Report Status 04/24/2011 FINAL   Final    Organism ID, Bacteria STAPHYLOCOCCUS SPECIES (COAGULASE NEGATIVE)   Final    Organism ID, Bacteria STREPTOCOCCUS GROUP G   Final   ANAEROBIC CULTURE     Status: Normal   Collection Time   04/19/11  9:49 PM      Component Value Range Status Comment   Specimen Description SYNOVIAL LEFT INTRAARTICULAR TOTAL KNEE FLUID   Final    Special Requests NONE   Final    Gram Stain     Final    Value: WBC PRESENT, PREDOMINANTLY PMN     NO ORGANISMS SEEN   Culture NO ANAEROBES ISOLATED   Final    Report Status 04/24/2011 FINAL   Final   CULTURE, ROUTINE-ABSCESS     Status: Normal   Collection Time   04/19/11  9:50 PM      Component Value Range Status Comment   Specimen Description LEG LEFT CALF ABCESS   Final    Special Requests NONE   Final    Gram Stain     Final    Value: MODERATE WBC PRESENT, PREDOMINANTLY PMN     NO SQUAMOUS EPITHELIAL CELLS SEEN     NO ORGANISMS SEEN   Culture NO GROWTH 3 DAYS   Final  Report Status 04/23/2011 FINAL   Final   ANAEROBIC CULTURE     Status: Normal   Collection Time   04/19/11  9:52 PM      Component Value Range Status Comment   Specimen Description LEG LEFT CALF ABCESS   Final    Special Requests NONE   Final    Gram Stain     Final      Value: FEW WBC PRESENT, PREDOMINANTLY PMN     NO SQUAMOUS EPITHELIAL CELLS SEEN     NO ORGANISMS SEEN   Culture NO ANAEROBES ISOLATED   Final    Report Status 04/24/2011 FINAL   Final   MRSA PCR SCREENING     Status: Normal   Collection Time   04/24/11  4:39 AM      Component Value Range Status Comment   MRSA by PCR NEGATIVE  NEGATIVE  Final     Studies/Results: No results found.   Assessment/Plan: Calf Abcess  L TKR infection (MSSE)  UGI bleed  Elevated Cr  Day 11 anbx (ancef/rifampin), plan for 6 weeks. Has PIC.  Will continue to watch, available if q's over w/e.  Cr back to normal range.    Johny Sax Infectious Diseases [phone] 04/25/2011, 10:17 AM

## 2011-04-25 NOTE — Progress Notes (Signed)
Subjective: Still some dark stool remnants.  Was up in the halls with PT and had to stop part way due to fatigue.  Also notes pain from an ingrown toenail  Objective: Vital signs in last 24 hours: Temp:  [97.9 F (36.6 C)-98.4 F (36.9 C)] 97.9 F (36.6 C) (04/04 2122) Pulse Rate:  [64-76] 64  (04/04 2122) Resp:  [17-18] 18  (04/04 2122) BP: (140-156)/(63-81) 152/69 mmHg (04/04 2122) SpO2:  [93 %-99 %] 93 % (04/04 2122) Weight change:  Last BM Date: 04/24/11  Intake/Output from previous day: 04/04 0701 - 04/05 0700 In: 2510.8 [P.O.:480; I.V.:1930.8; IV Piggyback:100] Out: 995 [Urine:990; Stool:5] Intake/Output this shift:  General appearance: alert, cooperative and appears stated age  Neck: no adenopathy, no carotid bruit, no JVD, supple, symmetrical, trachea midline and thyroid not enlarged, symmetric, no tenderness/mass/nodules  Resp: clear to auscultation bilaterally  Cardio: rate in the 80's, stable  GI: soft, non-tender; bowel sounds normal; no masses, no organomegaly  Skin: Skin color, texture, turgor normal. No rashes or lesions  Neurologic: Grossly normal   Lab Results:  Basename 04/25/11 0340 04/24/11 0325  WBC 10.5 10.6*  HGB 10.6* 10.8*  HCT 32.9* 31.6*  PLT 356 355   BMET  Basename 04/25/11 0340 04/24/11 0325  NA 134* 133*  K 3.6 3.7  CL 105 105  CO2 20 21  GLUCOSE 95 89  BUN 17 27*  CREATININE 1.33 1.47*  CALCIUM 8.1* 8.2*    Studies/Results: No results found.  Medications:  I have reviewed the patient's current medications. Scheduled:   . acetaminophen  650 mg Rectal Once  . ALPRAZolam  0.25 mg Oral Daily  . atorvastatin  40 mg Oral Daily  .  ceFAZolin (ANCEF) IV  1 g Intravenous Q8H  . isosorbide mononitrate  60 mg Oral Daily  . lisinopril  10 mg Oral Daily  . pantoprazole  40 mg Oral BID AC  . rifampin  300 mg Oral Daily  . Tamsulosin HCl  0.4 mg Oral QPC breakfast  . DISCONTD: perflutren lipid microspheres  10 microL/kg Intravenous  Once   Continuous:   . phenylephrine (NEO-SYNEPHRINE) Adult infusion Stopped (04/21/11 0358)  . DISCONTD: sodium chloride 100 mL/hr at 04/25/11 0019  . DISCONTD: pantoprozole (PROTONIX) infusion 8 mg/hr (04/25/11 0014)   ZOX:[id number], acetaminophen, ALPRAZolam, bisacodyl, ondansetron (ZOFRAN) IV, ondansetron, oxyCODONE-acetaminophen, polyethylene glycol, sodium chloride, traMADol, zolpidem, DISCONTD: fentaNYL, DISCONTD: midazolam  Assessment/Plan: 1) Cellulitis L leg. Cephalosporin + Rifampin per ID recommendations  2) UGIB- Followed by GI On IV PPI,will now change to PO bid 40mg  Protonix. No decrease in H+H in past 48 hours. 3) Bradycardia. Seen by Cards, thought to be vasovagal in nature. Atropine at bedside, but watch and wait approach. No indication for pacing. Has not recurred.  4) Renal insufficiency- Close to baseline6) Hyperkalemia- resolved  5) Anticoagulation held due to GI bleed.  6) Noted difficulty with urination. Flomax added a couple of days ago, no new issues 7) Ingrown toenail.  Perhaps Ortho could address Needs more PT to be able to return home.  Likely in next couple of days with IV Abx (Has PICC)   LOS: 10 days   Joe [name] W 04/25/2011, 7:08 AM

## 2011-04-25 NOTE — Progress Notes (Signed)
INITIAL ADULT NUTRITION ASSESSMENT Date: 04/25/2011   Time: 11:04 AM Reason for Assessment: Patient with length of stay and Poor Po intake  ASSESSMENT: Male 76 y.o.  Dx: Nausea, vomiting and abdominal pain  Hx:  Past Medical History  Diagnosis Date  . Hypertension   . Hypercholesteremia   . Anxiety   . Obesity (BMI 35.0-39.9 without comorbidity)    Related Meds:  Scheduled Meds:   . acetaminophen  650 mg Rectal Once  . ALPRAZolam  0.25 mg Oral Daily  . atorvastatin  40 mg Oral Daily  .  ceFAZolin (ANCEF) IV  1 g Intravenous Q8H  . isosorbide mononitrate  60 mg Oral Daily  . lisinopril  20 mg Oral Daily  . pantoprazole  40 mg Oral BID AC  . rifampin  300 mg Oral Daily  . Tamsulosin HCl  0.4 mg Oral QPC breakfast  . DISCONTD: lisinopril  10 mg Oral Daily  . DISCONTD: perflutren lipid microspheres  10 microL/kg Intravenous Once   Continuous Infusions:   . DISCONTD: sodium chloride 100 mL/hr at 04/25/11 0019  . DISCONTD: pantoprozole (PROTONIX) infusion 8 mg/hr (04/25/11 0014)  . DISCONTD: phenylephrine (NEO-SYNEPHRINE) Adult infusion Stopped (04/21/11 0358)   PRN Meds:.acetaminophen, acetaminophen, ALPRAZolam, bisacodyl, ondansetron (ZOFRAN) IV, ondansetron, oxyCODONE-acetaminophen, polyethylene glycol, sodium chloride, traMADol, zolpidem, DISCONTD: fentaNYL, DISCONTD: midazolam   Ht: 5\' 10"  (177.8 cm)  Wt: 262 lb (118.842 kg)  Ideal Wt: 75.45 kg % Ideal Wt: 157.8%  Usual Wt: 262 lb.  % Usual Wt: 100%   Body mass index is 37.59 kg/(m^2). (Obesity Class II)   Food/Nutrition Related Hx: Patient reported he has a good appetite nut dislikes the food here. Documented PO intake 50% at meals. He reported his PO intake is poor because he dislikes the food. He stated his wife is bringing him lunch and dinner today. He reported he does not follow any special diet at home and that he eats well.  *Noted patient with cellulitis of leg, calf abscess and L. Trunk infection.    Labs:  CMP     Component Value Date/Time   NA 134* 04/25/2011 0340   K 3.6 04/25/2011 0340   CL 105 04/25/2011 0340   CO2 20 04/25/2011 0340   GLUCOSE 95 04/25/2011 0340   BUN 17 04/25/2011 0340   CREATININE 1.33 04/25/2011 0340   CALCIUM 8.1* 04/25/2011 0340   PROT 5.6* 04/25/2011 0340   ALBUMIN 1.9* 04/25/2011 0340   AST 43* 04/25/2011 0340   ALT 24 04/25/2011 0340   ALKPHOS 69 04/25/2011 0340   BILITOT 0.5 04/25/2011 0340   GFRNONAA 51* 04/25/2011 0340   GFRAA 60* 04/25/2011 0340    Intake/Output Summary (Last 24 hours) at 04/25/11 1114 Last data filed at 04/25/11 1610  Gross per 24 hour  Intake 3255.83 ml  Output   1341 ml  Net 1914.83 ml     Diet Order: Cardiac  Supplements/Tube Feeding: none at this time  IVF:    DISCONTD: sodium chloride Last Rate: 100 mL/hr at 04/25/11 0019  DISCONTD: pantoprozole (PROTONIX) infusion Last Rate: 8 mg/hr (04/25/11 0014)  DISCONTD: phenylephrine (NEO-SYNEPHRINE) Adult infusion Last Rate: Stopped (04/21/11 0358)    Estimated Nutritional Needs:   Kcal: 9604-5409 Protein: 131-154.8 grams Fluid: 1 ml per kcal intake  NUTRITION DIAGNOSIS: -Increased nutrient needs (NI-5.1).  Status: Ongoing  RELATED TO: wounds and infection  AS EVIDENCE BY: patient with cellulitis of leg, calf abscess and L. Trunk infection.   MONITORING/EVALUATION(Goals): Skin, PO intake, Labs, Weight  trends 1. PO intake > 75% at meals 2. Skin improvements  EDUCATION NEEDS: -No education needs identified at this time  INTERVENTION: 1. Will order Prostat once daily for additional nutrients to promote wound and infection healing. 2. RD to follow for nutrition plan of care.   Dietitian [phone]  DOCUMENTATION CODES Per approved criteria  -Obesity Unspecified    Iven Finn [facility] 04/25/2011, 11:04 AM

## 2011-04-25 NOTE — Progress Notes (Signed)
Subjective: Afebrile and looks much better. Aspiration of left knee showed NO Fluid after multiple attempts. I was prepared to aspirate and send fluid for re-cultures. No cellulitis present. His motion is minimal. Infectious disease has seen him and are controlling his antibiotics. His Hbg is stable and his WBC has returned to normal.Will ambulate with PT and walker.Surgical site on left leg is fine and Cultures of this area have all been Negative.  Objective: Vital signs in last 24 hours: Temp:  [97.7 F (36.5 C)-98.4 F (36.9 C)] 97.7 F (36.5 C) (04/05 0540) Pulse Rate:  [64-76] 71  (04/05 0540) Resp:  [17-18] 18  (04/05 0540) BP: (140-156)/(62-81) 147/62 mmHg (04/05 0540) SpO2:  [93 %-99 %] 97 % (04/05 0540)  Intake/Output from previous day: 04/04 0701 - 04/05 0700 In: 2510.8 [P.O.:480; I.V.:1930.8; IV Piggyback:100] Out: 1445 [Urine:1440; Stool:5] Intake/Output this shift:     Basename 04/25/11 0340 04/24/11 0325 04/23/11 1530 04/23/11 0445 04/23/11 0200  HGB 10.6* 10.8* 8.5* 8.3* 8.3*    Basename 04/25/11 0340 04/24/11 0325  WBC 10.5 10.6*  RBC 3.75* 3.70*  HCT 32.9* 31.6*  PLT 356 355    Basename 04/25/11 0340 04/24/11 0325  NA 134* 133*  K 3.6 3.7  CL 105 105  CO2 20 21  BUN 17 27*  CREATININE 1.33 1.47*  GLUCOSE 95 89  CALCIUM 8.1* 8.2*    Basename 04/24/11 0325 04/23/11 1530  LABPT -- --  INR 1.18 1.39    Dorsiflexion/Plantar flexion intact No cellulitis present Compartment soft  Assessment/Plan: Ambulate with PT and follow with ID.   Marcela Alatorre A 04/25/2011, 7:38 AM

## 2011-04-25 NOTE — Progress Notes (Signed)
Patient seen, examined, and I agree with the above documentation, including the assessment and plan. Hgb has remained stable how over > 24 hours.  No overt rebleeding.   Primary team changed to po BID PPI which I would recommend for a period of at least 8 weeks (presumed treated for DU) I will see the patient in followup after hospital d/c and discuss EGD at some point to document healing. Patient understands and is happy with this plan. Call with questions.

## 2011-04-25 NOTE — Progress Notes (Signed)
SUBJECTIVE:  No palpitations or syncope.  Hemoglobin improving.   OBJECTIVE:   Vitals:   Filed Vitals:   04/24/11 1330 04/24/11 2122 04/25/11 0540 04/25/11 1402  BP: 140/81 152/69 147/62 136/69  Pulse: 74 64 71 65  Temp: 98.3 F (36.8 C) 97.9 F (36.6 C) 97.7 F (36.5 C) 98.2 F (36.8 C)  TempSrc: Oral Oral Oral Oral  Resp: 18 18 18 18   Height:      Weight:      SpO2: 99% 93% 97% 99%   I&O's:    Intake/Output Summary (Last 24 hours) at 04/25/11 1541 Last data filed at 04/25/11 1451  Gross per 24 hour  Intake 3135.83 ml  Output   1541 ml  Net 1594.83 ml   TELEMETRY: Reviewed telemetry pt in AFib, rate controlled.     PHYSICAL EXAM General: Well developed, well nourished, in no acute distress Head:    Normal cephalic and atraumatic  Lungs:   Clear bilaterally to auscultation and percussion. Heart:  Irregularly irregular rhythm, rate controlled Abdomen: obese Msk:  Back normal, normal gait. Normal strength and tone for age. Extremities:   No edema.   Neuro: Alert and oriented X 3. Psych:  Good affect, responds appropriately   LABS: Basic Metabolic Panel:  Basename 04/25/11 0340 04/24/11 0325  NA 134* 133*  K 3.6 3.7  CL 105 105  CO2 20 21  GLUCOSE 95 89  BUN 17 27*  CREATININE 1.33 1.47*  CALCIUM 8.1* 8.2*  MG -- --  PHOS -- --   Liver Function Tests:  [facility] 04/25/11 0340 04/24/11 0325  AST 43* 61*  ALT 24 33  ALKPHOS 69 68  BILITOT 0.5 0.8  PROT 5.6* 5.5*  ALBUMIN 1.9* 1.8*    Basename 04/23/11 1530  LIPASE 82*  AMYLASE --   CBC:  Basename 04/25/11 0340 04/24/11 0325  WBC 10.5 10.6*  NEUTROABS -- --  HGB 10.6* 10.8*  HCT 32.9* 31.6*  MCV 87.7 85.4  PLT 356 355   Cardiac Enzymes: No results found for this basename: CKTOTAL:3,CKMB:3,CKMBINDEX:3,TROPONINI:3 in the last 72 hours BNP: No components found with this basename: POCBNP:3 D-Dimer: No results found for this basename: DDIMER:2 in the last 72 hours Hemoglobin A1C: No  results found for this basename: HGBA1C in the last 72 hours Fasting Lipid Panel: No results found for this basename: CHOL,HDL,LDLCALC,TRIG,CHOLHDL,LDLDIRECT in the last 72 hours Thyroid Function Tests: No results found for this basename: TSH,T4TOTAL,FREET3,T3FREE,THYROIDAB in the last 72 hours Anemia Panel: No results found for this basename: VITAMINB12,FOLATE,FERRITIN,TIBC,IRON,RETICCTPCT in the last 72 hours Coag Panel:   Lab Results  Component Value Date   INR 1.18 04/24/2011   INR 1.39 04/23/2011   INR 1.55* 04/22/2011    RADIOLOGY:  Dg Chest Port 1 View  04/20/2011  *RADIOLOGY REPORT*  Clinical Data: Line placement.  PORTABLE CHEST - 1 VIEW  Comparison: 04/15/2011.  Findings: There is a new right upper extremity PICC.  The tip is poorly visualized but appears to extend to at least the mid SVC. The position of the tip is not definitely visualized.  The lung volumes remain low with pulmonary vascular congestion and basilar atelectasis.  Cardiomegaly remains present.  IMPRESSION: New right upper extremity PICC.  The tip is not well visualized due to overlapping structures.  The tip is at least in the mid SVC.  A repeat PA chest radiograph with full inspiration may better demonstrate the position of the PICC line tip.  Radiology department radiographs are typically better than  portable radiographs.  Otherwise unchanged appearance of the chest.  Original Report Authenticated By: Andreas Newport, M.D.        ASSESSMENT: AFib, bradycardia, sinus pauses With intense vagal stimulation  PLAN:  No further pauses or bradycardia in the past few days. Those events have only occurred with vagal stimulation.  NG tube is out.  No further vomiting. COntinue treatment for UGI bleed.  Hemodynamically stable at this time.  Corky Crafts., MD  04/25/2011  3:41 PM

## 2011-04-25 NOTE — Progress Notes (Signed)
Physical Therapy Treatment Patient Details Name: Joe [name] MRN: [id number] DOB: 07-28-1935 Today's Date: 04/25/2011  PT Assessment/Plan  PT - Assessment/Plan Comments on Treatment Session: Pt would only agree to perform reps of sit to stand to increase strength due to pain.  Transferred pt to recliner via lift to increase pt tolerance to upright position.   PT Plan: Discharge plan remains appropriate PT Frequency: Min 3X/week Follow Up Recommendations: Home health PT;Inpatient Rehab Equipment Recommended: Rolling walker with 5" wheels;Wheelchair (measurements) PT Goals  Acute Rehab PT Goals PT Goal Formulation: With patient Time For Goal Achievement: 2 weeks Pt will go Supine/Side to Sit: with supervision PT Goal: Supine/Side to Sit - Progress: Progressing toward goal Pt will go Sit to Supine/Side: with supervision PT Goal: Sit to Supine/Side - Progress: Met Pt will go Sit to Stand: with min assist PT Goal: Sit to Stand - Progress: Progressing toward goal  PT Treatment Precautions/Restrictions  Precautions Precautions: Fall Required Braces or Orthoses: No Restrictions Weight Bearing Restrictions: No Mobility (including Balance) Bed Mobility Bed Mobility: Yes Rolling Right: 5: Supervision Rolling Right Details (indicate cue type and reason): Supervision for safety with cues for LEs.  Rolling Left: 5: Supervision Rolling Left Details (indicate cue type and reason): same as above Supine to Sit: 4: Min assist Supine to Sit Details (indicate cue type and reason): Some assist for LLE, however able to use rails and continue to move B LE off of bed. Cues for technique.  Sit to Supine: 5: Supervision;HOB elevated (comment degrees) Sit to Supine - Details (indicate cue type and reason): Supervision for safety.  Transfers Transfers: Yes Sit to Stand: 1: +2 Total assist;Patient percentage (comment);From elevated surface;With upper extremity assist;From bed Sit to Stand Details  (indicate cue type and reason): Pt assist 50-60%, however unable to stand longer than 20 secs on each attempt.  Performed 4 reps of sit to stand with pt having increased c/o back pain and dizziness.  BP remained stable throughout.  Cues for proper hand placement due to pt tendency to pull up on RW.   Stand to Sit: 1: +2 Total assist;Patient percentage (comment) Stand to Sit Details: Pt assist 40%.  Assist for controlled descent with pt needing to sit quickly.  Transfer via Lift Equipment: Hydrographic surveyor (Used sara lift due to increased pain.) Ambulation/Gait Ambulation/Gait: No    Exercise    End of Session PT - End of Session Equipment Utilized During Treatment: Gait belt Activity Tolerance: Patient limited by fatigue;Patient limited by pain Patient left: in chair;with call bell in reach Nurse Communication: Mobility status for transfers;Need for lift equipment General Behavior During Session: [facility] for tasks performed Cognition: [facility] for tasks performed  Page, Meribeth Mattes 04/25/2011, 12:10 PM

## 2011-04-26 LAB — CBC
MCH: 29.2 pg (ref 26.0–34.0)
MCHC: 32.9 g/dL (ref 30.0–36.0)
MCV: 88.6 fL (ref 78.0–100.0)
Platelets: 384 10*3/uL (ref 150–400)
RDW: 15.3 % (ref 11.5–15.5)
WBC: 10.7 10*3/uL — ABNORMAL HIGH (ref 4.0–10.5)

## 2011-04-26 NOTE — Progress Notes (Signed)
Subjective: Patient states that he has no pain, appetite good, no nausea, vomiting, chest pain, shortness of breath. Has been out of bed with physical therapy. Denies bloody stools or dark stools. Denies fevers or chills to  Objective: Vital signs in last 24 hours: Temp:  [98.2 F (36.8 C)-99.5 F (37.5 C)] 98.7 F (37.1 C) (04/06 0444) Pulse Rate:  [65-73] 69  (04/06 0444) Resp:  [18-20] 20  (04/06 0444) BP: (136-161)/(69-83) 148/82 mmHg (04/06 0444) SpO2:  [95 %-99 %] 95 % (04/06 0444) Weight change:  Last BM Date: 04/26/11  CBG (last 3)   Basename 04/25/11 2109 04/24/11 2109 04/24/11 1703  GLUCAP 101* 126* 122*    Intake/Output from previous day: 04/05 0701 - 04/06 0700 In: 150 [IV Piggyback:150] Out: 1125 [Urine:1125] Intake/Output this shift: Total I/O In: -  Out: 450 [Urine:450]  General appearance-alert, cooperative, oriented x3, no apparent distress No oropharyngeal lesions Neck supple, no cervical lymphadenopathy, trachea midline Lungs clear to auscultation bilaterally Cardiovascular exam reveals irregularly irregular rhythm with controlled rate Abdomen soft, nontender, nondistended Extremity exam reveals trace edema, pedal pulses intact, no cyanosis Neurologic exam grossly nonfocal  Lab Results:  [facility] 04/25/11 0340 04/24/11 0325  NA 134* 133*  K 3.6 3.7  CL 105 105  CO2 20 21  GLUCOSE 95 89  BUN 17 27*  CREATININE 1.33 1.47*  CALCIUM 8.1* 8.2*  MG -- --  PHOS -- --    Basename 04/25/11 0340 04/24/11 0325  AST 43* 61*  ALT 24 33  ALKPHOS 69 68  BILITOT 0.5 0.8  PROT 5.6* 5.5*  ALBUMIN 1.9* 1.8*    Basename 04/26/11 0300 04/25/11 0340  WBC 10.7* 10.5  NEUTROABS -- --  HGB 11.0* 10.6*  HCT 33.4* 32.9*  MCV 88.6 87.7  PLT 384 356   No results found for this basename: CKTOTAL:3,CKMB:3,CKMBINDEX:3,TROPONINI:3 in the last 72 hours No results found for this basename: TSH,T4TOTAL,FREET3,T3FREE,THYROIDAB in the last 72 hours No results  found for this basename: VITAMINB12:2,FOLATE:2,FERRITIN:2,TIBC:2,IRON:2,RETICCTPCT:2 in the last 72 hours  Studies/Results: No results found.   Medications: Scheduled:   . acetaminophen  650 mg Rectal Once  . ALPRAZolam  0.25 mg Oral Daily  . atorvastatin  40 mg Oral Daily  .  ceFAZolin (ANCEF) IV  1 g Intravenous Q8H  . feeding supplement  30 mL Oral q morning - 10a  . isosorbide mononitrate  60 mg Oral Daily  . lisinopril  20 mg Oral Daily  . pantoprazole  40 mg Oral BID AC  . rifampin  300 mg Oral Daily  . 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   renal insufficiency- stable and close to baseline per old reports. Anticoagulation secondary to atrial for ablation but held secondary to GI bleed, currently on SCDs for prophylaxis Atrial fib- hemodynamically stable with good rate control Bradycardia thought to be to secondary to vagal stimulation, now stable for multiple days.     LOS: 11 days   Lashaye Fisk R 04/26/2011, 12:22 PM

## 2011-04-26 NOTE — Progress Notes (Signed)
Pt had 2.48 second pause on the monitor.  Pt stays in a-fib.  Pt was asymptomatic.  MD notified.  No new orders at this time.  Will continue to monitor.

## 2011-04-26 NOTE — Progress Notes (Signed)
Subjective: I Feel better   Objective: Vital signs in last 24 hours: Temp:  [98.2 F (36.8 C)-99.5 F (37.5 C)] 98.7 F (37.1 C) (04/06 0444) Pulse Rate:  [65-73] 69  (04/06 0444) Resp:  [18-20] 20  (04/06 0444) BP: (136-161)/(69-83) 148/82 mmHg (04/06 0444) SpO2:  [95 %-99 %] 95 % (04/06 0444)  Intake/Output from previous day: 04/05 0701 - 04/06 0700 In: 150 [IV Piggyback:150] Out: 1125 [Urine:1125] Intake/Output this shift:     Basename 04/26/11 0300 04/25/11 0340 04/24/11 0325 04/23/11 1530  HGB 11.0* 10.6* 10.8* 8.5*    Basename 04/26/11 0300 04/25/11 0340  WBC 10.7* 10.5  RBC 3.77* 3.75*  HCT 33.4* 32.9*  PLT 384 356    Basename 04/25/11 0340 04/24/11 0325  NA 134* 133*  K 3.6 3.7  CL 105 105  CO2 20 21  BUN 17 27*  CREATININE 1.33 1.47*  GLUCOSE 95 89  CALCIUM 8.1* 8.2*    Basename 04/24/11 0325 04/23/11 1530  LABPT -- --  INR 1.18 1.39    No erythema or warmth to the left knee Effusion slight if any Left leg Wound clean, dry, intact with no erythema. No change neurovascular status    Assessment/Plan: Followed by Dr Jacky Kindle and infectious disease. Afebrile WBC increased to 10.7 from 10.5 Dry tap of left knee yesterday by Dr Darrelyn Hillock On IV Ancef Will follow   Mercedees Convery A 04/26/2011, 10:50 AM

## 2011-04-27 LAB — CBC
MCH: 29.3 pg (ref 26.0–34.0)
MCHC: 33 g/dL (ref 30.0–36.0)
Platelets: 385 10*3/uL (ref 150–400)
RBC: 3.65 MIL/uL — ABNORMAL LOW (ref 4.22–5.81)
RDW: 15 % (ref 11.5–15.5)

## 2011-04-27 LAB — BASIC METABOLIC PANEL
Calcium: 8.4 mg/dL (ref 8.4–10.5)
GFR calc non Af Amer: 53 mL/min — ABNORMAL LOW (ref >90)
Sodium: 132 mEq/L — ABNORMAL LOW (ref 135–145)

## 2011-04-27 MED ORDER — FUROSEMIDE 40 MG PO TABS
40.0000 mg | ORAL_TABLET | Freq: Once | ORAL | Status: AC
  Administered 2011-04-27: 40 mg via ORAL
  Filled 2011-04-27: qty 1

## 2011-04-27 MED ORDER — FUROSEMIDE 8 MG/ML PO SOLN: 40.0000 mg | Freq: Once | ORAL | Status: DC

## 2011-04-27 NOTE — Progress Notes (Signed)
Joe [name]  MRN: [id number] DOB/Age: 74-Mar-1937 74 y.o. Physician: Lynnea Maizes, [name].D. 5 Days Post-Op Procedure(s) (LRB): ESOPHAGOGASTRODUODENOSCOPY (EGD) (N/A)  Subjective: No new complaints. Reports pain and selling left foot, " I think I might have gout" Vital Signs Temp:  [97.7 F (36.5 C)-98.5 F (36.9 C)] 98.5 F (36.9 C) (04/07 0520) Pulse Rate:  [73-79] 79  (04/07 0520) Resp:  [18] 18  (04/07 0520) BP: (107-157)/(58-91) 157/91 mmHg (04/07 0520) SpO2:  [95 %-97 %] 96 % (04/07 0520)  Lab Results  Basename 04/27/11 0445 04/26/11 0300  WBC 9.7 10.7*  HGB 10.7* 11.0*  HCT 32.4* 33.4*  PLT 385 384   BMET  Basename 04/27/11 0445 04/25/11 0340  NA 132* 134*  K 3.6 3.6  CL 101 105  CO2 21 20  GLUCOSE 89 95  BUN 13 17  CREATININE 1.30 1.33  CALCIUM 8.4 8.1*   INR  Date Value Range Status  04/24/2011 1.18  0.00-1.49 (no units) Final     Exam  Left calf diffusely swollen, mild erythema and induration about mid calf incision. No drainage. Left foot mildly swollen, no localized erythema or fluctuance.  Impression: Left calf cellulitis, L TKA septic arthritis MSSE  Plan 6 weeks IV ABX, D/C planning Joe [name] 04/27/2011, 9:08 AM

## 2011-04-27 NOTE — Progress Notes (Signed)
Subjective: Patient states that he has no pain, appetite good, no nausea, vomiting, chest pain, shortness of breath. Has been out of bed with physical therapy. Had one small stool this morning questionably dark. Denies fevers or chills ear he complains of worsening lower extremity edema  Objective: Vital signs in last 24 hours: Temp:  [97.7 F (36.5 C)-98.5 F (36.9 C)] 98.5 F (36.9 C) (04/07 0520) Pulse Rate:  [73-79] 79  (04/07 0520) Resp:  [18] 18  (04/07 0520) BP: (107-157)/(58-91) 157/91 mmHg (04/07 0520) SpO2:  [95 %-97 %] 96 % (04/07 0520) Weight change:  Last BM Date: 04/26/11-actually earlier this morning CBG (last 3)   Basename 04/25/11 2109 04/24/11 2109 04/24/11 1703  GLUCAP 101* 126* 122*    Intake/Output from previous day: 04/06 0701 - 04/07 0700 In: 510 [P.O.:360; IV Piggyback:150] Out: 2675 [Urine:2675] Intake/Output this shift:    General appearance-alert, cooperative, oriented x3, no apparent distress No oropharyngeal lesions Neck supple, no cervical lymphadenopathy, trachea midline Lungs clear to auscultation bilaterally Cardiovascular exam reveals irregularly irregular rhythm with controlled rate Abdomen soft, nontender, nondistended Knee swollen Extremity exam reveals trace to 1+ edema, pedal pulses intact, no cyanosis Neurologic exam grossly nonfocal  Lab Results:  [facility] 04/27/11 0445 04/25/11 0340  NA 132* 134*  K 3.6 3.6  CL 101 105  CO2 21 20  GLUCOSE 89 95  BUN 13 17  CREATININE 1.30 1.33  CALCIUM 8.4 8.1*  MG -- --  PHOS -- --    Basename 04/25/11 0340  AST 43*  ALT 24  ALKPHOS 69  BILITOT 0.5  PROT 5.6*  ALBUMIN 1.9*    Basename 04/27/11 0445 04/26/11 0300  WBC 9.7 10.7*  NEUTROABS -- --  HGB 10.7* 11.0*  HCT 32.4* 33.4*  MCV 88.8 88.6  PLT 385 384   No results found for this basename: CKTOTAL:3,CKMB:3,CKMBINDEX:3,TROPONINI:3 in the last 72 hours No results found for this basename:  TSH,T4TOTAL,FREET3,T3FREE,THYROIDAB in the last 72 hours No results found for this basename: VITAMINB12:2,FOLATE:2,FERRITIN:2,TIBC:2,IRON:2,RETICCTPCT:2 in the last 72 hours  Studies/Results: No results found.   Medications: Scheduled:    . acetaminophen  650 mg Rectal Once  . ALPRAZolam  0.25 mg Oral Daily  . atorvastatin  40 mg Oral Daily  .  ceFAZolin (ANCEF) IV  1 g Intravenous Q8H  . feeding supplement  30 mL Oral q morning - 10a  . isosorbide mononitrate  60 mg Oral Daily  . lisinopril  20 mg Oral Daily  . pantoprazole  40 mg Oral BID AC  . rifampin  300 mg Oral Daily  . 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  One small bowel movement with dark stool this morning reported, will follow H&H. renal insufficiency- stable and close to baseline per old reports. Anticoagulation secondary to atrial for ablation but held secondary to GI bleed, currently on SCDs for prophylaxis Atrial fib- hemodynamically stable with good rate control Bradycardia thought to be to secondary to vagal stimulation, now stable for multiple days. Lower extremity edema without any evidence of congestive heart failure on physical exam, will give 1 dose of diuretic now and check  electrolytes in the morning.     LOS: 12 days   Hina Gupta R 04/27/2011, 11:30 AM

## 2011-04-28 ENCOUNTER — Other Ambulatory Visit: Payer: Self-pay | Admitting: Cardiology

## 2011-04-28 MED ORDER — PANTOPRAZOLE SODIUM 40 MG PO TBEC: 40.0000 mg | DELAYED_RELEASE_TABLET | Freq: Two times a day (BID) | ORAL | Status: DC

## 2011-04-28 MED ORDER — TAMSULOSIN HCL 0.4 MG PO CAPS: 0.4000 mg | ORAL_CAPSULE | Freq: Every day | ORAL | Status: DC

## 2011-04-28 MED ORDER — CEFAZOLIN SODIUM 1-5 GM-% IV SOLN: 1.0000 g | Freq: Three times a day (TID) | INTRAVENOUS | Status: DC

## 2011-04-28 MED ORDER — ZOLPIDEM TARTRATE 5 MG PO TABS: 5.0000 mg | ORAL_TABLET | Freq: Every evening | ORAL | Status: DC | PRN

## 2011-04-28 MED ORDER — OXYCODONE-ACETAMINOPHEN 5-325 MG PO TABS: 1.0000 | ORAL_TABLET | Freq: Four times a day (QID) | ORAL | Status: AC | PRN

## 2011-04-28 MED ORDER — RIFAMPIN 300 MG PO CAPS: 300.0000 mg | ORAL_CAPSULE | Freq: Every day | ORAL | Status: AC

## 2011-04-28 NOTE — Progress Notes (Signed)
CSW consulted for assistance with d/c planning. Pt's MD arranged SNF placement at [facility] for rehab. Pt was d/c there today via [facility] transport. Pt/family were very pleased with these d/c plans.  Cori Razor  LCSW [phone]

## 2011-04-28 NOTE — Progress Notes (Signed)
Subjective: Wound looks fine.No knee effusion or erythema. He is afebrile. Case discussed with Dr. Jacky Kindle and he will DC on the present antibiotics and Dr. Ninetta Lights and I will follow.He was offered SNF by Dr. Jacky Kindle ,but he desires to go home.I instructed him to see me in one week or before if any problems.He will have Home Health follow him at home also for antibiotic administration and Physical Therapy.   Objective: Vital signs in last 24 hours: Temp:  [98.2 F (36.8 C)-98.8 F (37.1 C)] 98.8 F (37.1 C) (04/08 0537) Pulse Rate:  [69-85] 69  (04/08 0537) Resp:  [18-24] 24  (04/08 0537) BP: (128-149)/(68-71) 128/68 mmHg (04/08 0537) SpO2:  [96 %-98 %] 98 % (04/08 0537)  Intake/Output from previous day: 04/07 0701 - 04/08 0700 In: 210 [IV Piggyback:50] Out: 1375 [Urine:1375] Intake/Output this shift:     Basename 04/27/11 0445 04/26/11 0300  HGB 10.7* 11.0*    Basename 04/27/11 0445 04/26/11 0300  WBC 9.7 10.7*  RBC 3.65* 3.77*  HCT 32.4* 33.4*  PLT 385 384    Basename 04/27/11 0445  NA 132*  K 3.6  CL 101  CO2 21  BUN 13  CREATININE 1.30  GLUCOSE 89  CALCIUM 8.4   No results found for this basename: LABPT:2,INR:2 in the last 72 hours  Neurovascular intact No cellulitis present  Assessment/Plan: Follow up as stated above.   Prentiss Polio A 04/28/2011, 7:14 AM

## 2011-04-28 NOTE — Progress Notes (Signed)
CARE MANAGEMENT NOTE 04/28/2011  Patient:  Joe [name], Joe [name]   Account Number:  [id number]  Date Initiated:  04/24/2011  Documentation initiated by:  [facility]  Subjective/Objective Assessment:   Pt admitted with cellitis of left leg.     Action/Plan:   Plan is for pt to go home with home health services once medically stable.   Anticipated DC Date:  04/27/2011   Anticipated DC Plan:  SKILLED NURSING FACILITY  In-house referral  Clinical Social Worker      DC Planning Services  CM consult      [facility] Choice  NA   Choice offered to / List presented to:  [name]   DME arranged  NA      DME agency  NA     HH arranged  NA      HH agency  NA   Status of service:  Completed, signed off Medicare Important Message given?  NO (If response is "NO", the following Medicare IM given date fields will be blank) Per UR Regulation:    If discussed at Long Length of Stay Meetings, dates discussed:   04/23/2011    Comments:  04/28/2011 Joe [name] bsn ccm [phone] CM spoke with patient. Initial plan was for patient to return to his home with spouse as caregiver. He has decided however that going to SNF rehab would be better. Pt will need IV ABX X 6 wks and PT/OT to regain ability to ambulate safely. CSW referral made by floor. CM signing off.

## 2011-04-28 NOTE — Discharge Summary (Signed)
DISCHARGE SUMMARY  SHALIN VONBARGEN  MR#: [id number]  DOB:[birth year]  Date of Admission: 04/15/2011 Date of Discharge: 04/28/2011  Attending Physician:Adelaine Roppolo A  Patient's PCP:No primary provider on file.  Consults:Treatment Team:  Minda Meo, MD Eugenia Mcalpine, MD Othella Boyer, MD Corky Crafts, MD  Discharge Diagnoses: Active Problems:  Cellulitis of leg and posterior compartment abscess requiring incision and drainage  Atrial fibrillation  Hematemesis  Anemia, posthemorrhagic, acute  Upper GI bleed  Left knee periprosthetic infection requiring 6 weeks of antibiotics  Coronary artery disease status post remote coronary artery bypass grafting  Discharge Medications: Medication List  As of 04/28/2011  7:18 AM   STOP taking these medications         aspirin EC 81 MG tablet         TAKE these medications         ALPRAZolam 0.25 MG tablet   Commonly known as: XANAX   Take 0.25 mg by mouth daily.      atorvastatin 40 MG tablet   Commonly known as: LIPITOR   Take 40 mg by mouth daily.      ceFAZolin 1-5 GM-%   Commonly known as: ANCEF   Inject 50 mLs (1 g total) into the vein every 8 (eight) hours.      doxycycline 100 MG tablet   Commonly known as: VIBRA-TABS   Take 100 mg by mouth 2 (two) times daily. He is taking for 14 days. He started on 04/08/11 and has not completed.      isosorbide mononitrate 60 MG 24 hr tablet   Commonly known as: IMDUR   Take 60 mg by mouth daily.      lisinopril 10 MG tablet   Commonly known as: PRINIVIL,ZESTRIL   Take 10 mg by mouth daily.      meloxicam 15 MG tablet   Commonly known as: MOBIC   Take 15 mg by mouth daily as needed. For pain.      oxyCODONE-acetaminophen 5-325 MG per tablet   Commonly known as: PERCOCET   Take 1-2 tablets by mouth every 6 (six) hours as needed.      pantoprazole 40 MG tablet   Commonly known as: PROTONIX   Take 1 tablet (40 mg total) by mouth 2 (two) times daily before a  meal.      rifampin 300 MG capsule   Commonly known as: RIFADIN   Take 1 capsule (300 mg total) by mouth daily.      Tamsulosin HCl 0.4 MG Caps   Commonly known as: FLOMAX   Take 1 capsule (0.4 mg total) by mouth daily after breakfast.      traMADol 50 MG tablet   Commonly known as: ULTRAM   Take 50 mg by mouth every 4 (four) hours as needed. For pain.      zolpidem 5 MG tablet   Commonly known as: AMBIEN   Take 1 tablet (5 mg total) by mouth at bedtime as needed for sleep.            Hospital Procedures: X-ray Chest Pa And Lateral   04/15/2011  *RADIOLOGY REPORT*  Clinical Data: Elevated white blood cell count.  Weakness.  CHEST - 2 VIEW  Comparison: 11/12/2006  Findings: Prior median sternotomy.  Cardiomegaly.  Lungs are clear. No effusions.  No acute bony abnormality.  IMPRESSION: Cardiomegaly.  No active cardiopulmonary disease.  Original Report Authenticated By: Cyndie Chime, M.D.   Ct Tibia Fibula Left Wo Contrast  04/18/2011  *RADIOLOGY REPORT*  Clinical Data: Leg redness.  Evaluate for abscess.  CT TIBIA FIBULA LEFT WITHOUT CONTRAST  Technique:  Contiguous noncontrast axial images were obtained of the left leg from both the knee to below the ankle.  Coronal and sagittal reconstructions were created in bone and soft tissue windows.  Comparison: 04/15/2011.  Findings: Please note that noncontrast technique gives suboptimal visualization of vascular/enhancement of fluid.  Within this limitation, there is pronounced infiltration of the dorsal subcutaneous tissues of the leg.  Additionally, in the posterior compartment, interposed between the medial head of the gastrocnemius and the soleus muscle, there is a focal fluid collection with a tiny locule of gas highly suspicious for multi locular abscess.  On axial image number 65, this measures 78 mm AP by 41 mm transverse.  Craniocaudal extent is 17 cm.  Medial leg varicosities are present with phleboliths.  Severe below-the-knee  atherosclerosis.  The bones demonstrate osteolysis around the cemented Constrained knee arthroplasty which may represent chronic infection or particle disease.  Ordinary mechanical loosening is considered less likely.  This is most pronounced around the patellar component and femoral component.  There is also lucency around the bone cement in the tibial plateau.  IMPRESSION:  1.  Elongated medial posterior compartment fluid collection with gas locules suspicious for deep abscess interposed between the medial head gastrocnemius and the soleus muscle. 2.  Severe subcutaneous edema/fluid. 3.  Osteolysis around the total knee arthroplasty, likely representing either chronic infection or particle disease, with mechanical loosening considered unlikely.  Original Report Authenticated By: Andreas Newport, M.D.   Dg Chest Port 1 View  04/20/2011  *RADIOLOGY REPORT*  Clinical Data: Line placement.  PORTABLE CHEST - 1 VIEW  Comparison: 04/15/2011.  Findings: There is a new right upper extremity PICC.  The tip is poorly visualized but appears to extend to at least the mid SVC. The position of the tip is not definitely visualized.  The lung volumes remain low with pulmonary vascular congestion and basilar atelectasis.  Cardiomegaly remains present.  IMPRESSION: New right upper extremity PICC.  The tip is not well visualized due to overlapping structures.  The tip is at least in the mid SVC.  A repeat PA chest radiograph with full inspiration may better demonstrate the position of the PICC line tip.  Radiology department radiographs are typically better than portable radiographs.  Otherwise unchanged appearance of the chest.  Original Report Authenticated By: Andreas Newport, M.D.   Dg Knee Left Port  04/15/2011  *RADIOLOGY REPORT*  Clinical Data: Left knee pain.  History of knee replacement.  PORTABLE LEFT KNEE - 1-2 VIEW  Comparison: Plain films 11/18/2006.  Findings: The patient has a cemented and constrained total knee  arthroplasty.  The device is located.  There is lucency at the bone cement interface subjacent to the lateral plateaus and medial femoral condyle. Bone cement interface in the tibial diaphysis is unremarkable.  No acute fracture is identified.  The patient has an old patellar fracture.  No joint effusion.  IMPRESSION:  1.  Status post left total knee replacement.  Lucency at the bone cement interface at the level of tibial metaphyses and medial femoral condyle is suggestive of loosening. Note is again made that no lucency about the tibial stem is seen. 2.  Old patellar fracture.  Original Report Authenticated By: Bernadene Bell. Maricela Curet, M.D.    History of Present Illness:  The patient is a 76 year old male who presented to the hospital today for  what he thought was a surgical procedure to remove a loose femoral pin from his left total knee revision. In reality, he was scheduled for preoperative labs and workup today, with his surgery scheduled for next Wednesday April 23, 2011. Upon reaching the hospital via ambulance, he reported feeling sick the last 3-4 days. He reports that since early over the weekend he has had a fever, nausea, vomiting, urinary urgency and frequency, abdominal pain and tenderness, generalized body aches, and left leg pain. He states that he has been unable to keep anything down and is going to the restroom very often. He reports that shortly thereafter he began to notice that his left lower extremity was becoming red and swollen. He denies any drainage from a surgical wound that he has on his leg. Dr. Darrelyn Hillock attempted to remove the femoral pin in the office under sterile procedure as on x-ray the femoral pin appeared to be very superficial. He was unable to do so and therefore scheduled the patient for surgery. He reports that at the present time his left leg has a throbbing pain in it constantly and he has increased sharp pain in the leg when he moves it, specifically at the left knee. He  denies chest pain, shortness of breath, dysuira, and dizziness at the present time.   Hospital Course: Patient was admitted by the orthopedic service initially it seemed like rate for distal left lower extremity cellulitis. Was placed on broad-spectrum antibiotics to include vancomycin and Zosyn. Despite this he failed to improve clinically as exam demonstrated continued swelling of the calf and persistent erythema. Subsequent CT scan demonstrated a posterior compartment abscess and orthopedic surgery was reconsulted resulting in a trip to the emergency room where Dr. Thomasena Edis I and D. the left calf fluid collection. Abrasions were done of the left knee as well and the only thing that has truly grown has been a methicillin sensitive staph. Similarly, should clinically improved with normalization of the white count, defervescence and improved pain. The leg does remain a bit swollen. Cath was closed one strain was removed and subsequent aspirations of the left knee have been sterile. The complicating this was sudden onset of melanotic stools and evidence for an upper GI bleed. She did require transfusion of 3 units packed red blood cells and endoscopy performed revealed blood clots in the duodenum most compatible with duodenal bulb ulceration. Patient responded to transfusion, IV fluids and proton pump inhibitors. I'll A., from cardiac standpoint patient has been in atrial fibrillation rate has been well-controlled but he is not a candidate for anticoagulation for at least 4-6 weeks of his upper GI bleed. I did discuss with patient the challenges of these risks and the potential risk for thromboembolic event/stroke but given the GI bleed we must allow full healing of his duodenal ulceration. We did offer him skilled nursing for continued IV antibiotics and rehabilitation but he prefers and has chosen to go home with his wife. Currently has no open wounds and only the sutures involving the left calf. He does have a  PICC line involving the right upper extremity. He is to complete 6 weeks of IV antibiotics with long-term suppressive therapy thereafter.  Day of Discharge Exam BP 128/68  Pulse 69  Temp(Src) 98.8 F (37.1 C) (Oral)  Resp 24  Ht 5\' 10"  (1.778 m)  Wt 118.842 kg (262 lb)  BMI 37.59 kg/m2  SpO2 98%  Physical Exam: General appearance: alert, cooperative and no distress Eyes: no scleral icterus Throat:  oropharynx moist without erythema Resp: clear to auscultation bilaterally Cardio: irregularly irregular rhythm Extremities: Lower extremity does reveal 1+ edema and a 3-4 cm tissue which is clean and sutured. Does have some mild erythema distally. Left knee appears to have some mild swelling. Distal nerve vascular is intact. Right lower extremity is unremarkable.  Discharge Labs:  [facility] 04/27/11 0445  NA 132*  K 3.6  CL 101  CO2 21  GLUCOSE 89  BUN 13  CREATININE 1.30  CALCIUM 8.4  MG --  PHOS --   No results found for this basename: AST:2,ALT:2,ALKPHOS:2,BILITOT:2,PROT:2,ALBUMIN:2 in the last 72 hours  Basename 04/27/11 0445 04/26/11 0300  WBC 9.7 10.7*  NEUTROABS -- --  HGB 10.7* 11.0*  HCT 32.4* 33.4*  MCV 88.8 88.6  PLT 385 384   No results found for this basename: CKTOTAL:3,CKMB:3,CKMBINDEX:3,TROPONINI:3 in the last 72 hours No results found for this basename: TSH,T4TOTAL,FREET3,T3FREE,THYROIDAB in the last 72 hours No results found for this basename: VITAMINB12:2,FOLATE:2,FERRITIN:2,TIBC:2,IRON:2,RETICCTPCT:2 in the last 72 hours  Discharge instructions: Discharge Orders    Future Appointments: Provider: Department: Dept Phone: Center:   06/02/2011 1:30 PM Beverley Fiedler, MD [facility] [phone] [id number]     Future Orders Please Complete By Expires   Diet - low sodium heart healthy      Diet - low sodium heart healthy      Call MD / Call 911      Comments:   If you experience chest pain or shortness of breath, CALL 911 and be transported to the  hospital emergency room.  If you develope a fever above 101 F, pus (white drainage) or increased drainage or redness at the wound, or calf pain, call your surgeon's office.   Constipation Prevention      Comments:   Drink plenty of fluids.  Prune juice may be helpful.  You may use a stool softener, such as Colace (over the counter) 100 mg twice a day.  Use MiraLax (over the counter) for constipation as needed.   Increase activity slowly as tolerated      Weight Bearing as taught in Physical Therapy      Comments:   Use a walker or crutches as instructed.   Discharge instructions      Comments:   Walk with your walker. Weight bearing as tolerated. Call if any temperatures greater than 101 or any wound complications: [phone] during the day and ask for Dr. Jeannetta Ellis nurse, Mackey Birchwood.   Increase activity slowly         Disposition: Home with home health to include RN for IV antibiotics and PICC line support as well as OT and PT.  Follow-up Appts: Follow-up with Dr. Jacky Kindle at [facility] in 2wks  Call for appointment.  Condition on Discharge: Improved  Tests Needing Follow-up: 6 weeks of IV antibiotics as well as indefinite rifampin  Signed: Kally Cadden A 04/28/2011, 7:18 AM

## 2011-05-01 ENCOUNTER — Telehealth: Payer: Self-pay | Admitting: Gastroenterology

## 2011-05-01 NOTE — Telephone Encounter (Signed)
Per Willette Cluster, I Spoke to pt. About seeing Dr. Rhea Belton sooner than 06/02/2011; Pt said he is in rehab and probably wont be out for at least 20 days and will keep the apt. He has. But, I can call him in 2 weeks to see if that time has been shortened.

## 2011-05-29 ENCOUNTER — Encounter: Payer: Self-pay | Admitting: Internal Medicine

## 2011-06-02 ENCOUNTER — Ambulatory Visit: Payer: Medicare Other | Admitting: Internal Medicine

## 2011-07-07 ENCOUNTER — Encounter (INDEPENDENT_AMBULATORY_CARE_PROVIDER_SITE_OTHER): Payer: Medicare Other | Admitting: Ophthalmology

## 2011-07-07 DIAGNOSIS — H35039 Hypertensive retinopathy, unspecified eye: Secondary | ICD-10-CM

## 2011-07-07 DIAGNOSIS — H43819 Vitreous degeneration, unspecified eye: Secondary | ICD-10-CM

## 2011-07-07 DIAGNOSIS — I10 Essential (primary) hypertension: Secondary | ICD-10-CM

## 2011-07-07 DIAGNOSIS — H3530 Unspecified macular degeneration: Secondary | ICD-10-CM

## 2011-08-18 ENCOUNTER — Encounter (INDEPENDENT_AMBULATORY_CARE_PROVIDER_SITE_OTHER): Payer: Medicare Other | Admitting: Ophthalmology

## 2011-08-18 DIAGNOSIS — H35039 Hypertensive retinopathy, unspecified eye: Secondary | ICD-10-CM

## 2011-08-18 DIAGNOSIS — H35359 Cystoid macular degeneration, unspecified eye: Secondary | ICD-10-CM

## 2011-08-18 DIAGNOSIS — H2700 Aphakia, unspecified eye: Secondary | ICD-10-CM

## 2011-08-18 DIAGNOSIS — I10 Essential (primary) hypertension: Secondary | ICD-10-CM

## 2011-08-18 DIAGNOSIS — H43819 Vitreous degeneration, unspecified eye: Secondary | ICD-10-CM

## 2011-09-23 ENCOUNTER — Other Ambulatory Visit: Payer: Self-pay | Admitting: Surgical

## 2011-09-23 ENCOUNTER — Inpatient Hospital Stay (HOSPITAL_COMMUNITY)
Admission: AD | Admit: 2011-09-23 | Discharge: 2011-09-29 | DRG: 486 | Disposition: A | Discharge status: Skilled Nursing Facility | Payer: Medicare Other | Source: Ambulatory Visit | Attending: Orthopedic Surgery | Admitting: Orthopedic Surgery

## 2011-09-23 ENCOUNTER — Encounter (HOSPITAL_COMMUNITY): Payer: Self-pay

## 2011-09-23 DIAGNOSIS — E669 Obesity, unspecified: Secondary | ICD-10-CM | POA: Diagnosis present

## 2011-09-23 DIAGNOSIS — E871 Hypo-osmolality and hyponatremia: Secondary | ICD-10-CM | POA: Diagnosis not present

## 2011-09-23 DIAGNOSIS — I2510 Atherosclerotic heart disease of native coronary artery without angina pectoris: Secondary | ICD-10-CM | POA: Diagnosis present

## 2011-09-23 DIAGNOSIS — F411 Generalized anxiety disorder: Secondary | ICD-10-CM | POA: Diagnosis present

## 2011-09-23 DIAGNOSIS — I10 Essential (primary) hypertension: Secondary | ICD-10-CM | POA: Diagnosis present

## 2011-09-23 DIAGNOSIS — D62 Acute posthemorrhagic anemia: Secondary | ICD-10-CM | POA: Diagnosis not present

## 2011-09-23 DIAGNOSIS — T8450XA Infection and inflammatory reaction due to unspecified internal joint prosthesis, initial encounter: Principal | ICD-10-CM | POA: Diagnosis present

## 2011-09-23 DIAGNOSIS — Z96659 Presence of unspecified artificial knee joint: Secondary | ICD-10-CM

## 2011-09-23 DIAGNOSIS — T84039A Mechanical loosening of unspecified internal prosthetic joint, initial encounter: Secondary | ICD-10-CM | POA: Diagnosis present

## 2011-09-23 DIAGNOSIS — Z951 Presence of aortocoronary bypass graft: Secondary | ICD-10-CM

## 2011-09-23 DIAGNOSIS — Y831 Surgical operation with implant of artificial internal device as the cause of abnormal reaction of the patient, or of later complication, without mention of misadventure at the time of the procedure: Secondary | ICD-10-CM | POA: Diagnosis present

## 2011-09-23 DIAGNOSIS — T8459XA Infection and inflammatory reaction due to other internal joint prosthesis, initial encounter: Secondary | ICD-10-CM

## 2011-09-23 LAB — DIFFERENTIAL
Basophils Absolute: 0 10*3/uL (ref 0.0–0.1)
Basophils Relative: 0 % (ref 0–1)
Eosinophils Absolute: 0 10*3/uL (ref 0.0–0.7)
Eosinophils Relative: 0 % (ref 0–5)
Lymphocytes Relative: 6 % — ABNORMAL LOW (ref 12–46)
Lymphs Abs: 0.7 10*3/uL (ref 0.7–4.0)
Monocytes Absolute: 1.3 10*3/uL — ABNORMAL HIGH (ref 0.1–1.0)
Monocytes Relative: 11 % (ref 3–12)
Neutro Abs: 9.8 10*3/uL — ABNORMAL HIGH (ref 1.7–7.7)
Neutrophils Relative %: 83 % — ABNORMAL HIGH (ref 43–77)

## 2011-09-23 LAB — URINALYSIS, ROUTINE W REFLEX MICROSCOPIC
Glucose, UA: NEGATIVE mg/dL
Hgb urine dipstick: NEGATIVE
Ketones, ur: NEGATIVE mg/dL
Nitrite: NEGATIVE
Protein, ur: 100 mg/dL — AB
Specific Gravity, Urine: 1.025 (ref 1.005–1.030)
Urobilinogen, UA: 0.2 mg/dL (ref 0.0–1.0)
pH: 5 (ref 5.0–8.0)

## 2011-09-23 LAB — CBC
HCT: 32.1 % — ABNORMAL LOW (ref 39.0–52.0)
Hemoglobin: 10.6 g/dL — ABNORMAL LOW (ref 13.0–17.0)
MCH: 27.3 pg (ref 26.0–34.0)
MCHC: 33 g/dL (ref 30.0–36.0)
MCV: 82.7 fL (ref 78.0–100.0)
Platelets: 161 10*3/uL (ref 150–400)
RBC: 3.88 MIL/uL — ABNORMAL LOW (ref 4.22–5.81)
RDW: 16 % — ABNORMAL HIGH (ref 11.5–15.5)
WBC: 11.8 10*3/uL — ABNORMAL HIGH (ref 4.0–10.5)

## 2011-09-23 LAB — COMPREHENSIVE METABOLIC PANEL
ALT: 10 U/L (ref 0–53)
AST: 16 U/L (ref 0–37)
Albumin: 2.5 g/dL — ABNORMAL LOW (ref 3.5–5.2)
Alkaline Phosphatase: 78 U/L (ref 39–117)
BUN: 26 mg/dL — ABNORMAL HIGH (ref 6–23)
CO2: 20 mEq/L (ref 19–32)
Calcium: 8.6 mg/dL (ref 8.4–10.5)
Chloride: 96 mEq/L (ref 96–112)
Creatinine, Ser: 1.33 mg/dL (ref 0.50–1.35)
GFR calc Af Amer: 59 mL/min — ABNORMAL LOW (ref >90)
GFR calc non Af Amer: 51 mL/min — ABNORMAL LOW (ref >90)
Glucose, Bld: 110 mg/dL — ABNORMAL HIGH (ref 70–99)
Potassium: 3.7 mEq/L (ref 3.5–5.1)
Sodium: 129 mEq/L — ABNORMAL LOW (ref 135–145)
Total Bilirubin: 0.3 mg/dL (ref 0.3–1.2)
Total Protein: 7.3 g/dL (ref 6.0–8.3)

## 2011-09-23 LAB — PROTIME-INR
INR: 1.22 (ref 0.00–1.49)
Prothrombin Time: 15.7 seconds — ABNORMAL HIGH (ref 11.6–15.2)

## 2011-09-23 LAB — APTT: aPTT: 36 seconds (ref 24–37)

## 2011-09-23 LAB — URINE MICROSCOPIC-ADD ON

## 2011-09-23 MED ORDER — TRAMADOL HCL 50 MG PO TABS: 50.0000 mg | ORAL_TABLET | ORAL | Status: DC | PRN

## 2011-09-23 MED ORDER — ACETAMINOPHEN 325 MG PO TABS: 650.0000 mg | ORAL_TABLET | Freq: Four times a day (QID) | ORAL | Status: DC | PRN

## 2011-09-23 MED ORDER — VANCOMYCIN HCL 1000 MG IV SOLR
2000.0000 mg | Freq: Once | INTRAVENOUS | Status: AC
  Administered 2011-09-23: 2000 mg via INTRAVENOUS
  Filled 2011-09-23: qty 2000

## 2011-09-23 MED ORDER — SODIUM CHLORIDE 0.9 % IJ SOLN: 3.0000 mL | INTRAMUSCULAR | Status: DC | PRN

## 2011-09-23 MED ORDER — SODIUM CHLORIDE 0.9 % IV SOLN: 250.0000 mL | INTRAVENOUS | Status: DC | PRN

## 2011-09-23 MED ORDER — OXYCODONE HCL 5 MG PO TABS
5.0000 mg | ORAL_TABLET | ORAL | Status: DC | PRN
  Administered 2011-09-23 (×3): 5 mg via ORAL
  Filled 2011-09-23 (×3): qty 1

## 2011-09-23 MED ORDER — ONDANSETRON HCL 4 MG PO TABS: 4.0000 mg | ORAL_TABLET | Freq: Four times a day (QID) | ORAL | Status: DC | PRN

## 2011-09-23 MED ORDER — ACETAMINOPHEN 325 MG PO TABS
650.0000 mg | ORAL_TABLET | Freq: Four times a day (QID) | ORAL | Status: DC | PRN
  Administered 2011-09-24: 650 mg via ORAL
  Filled 2011-09-23: qty 2

## 2011-09-23 MED ORDER — FLEET ENEMA 7-19 GM/118ML RE ENEM: 1.0000 | ENEMA | Freq: Once | RECTAL | Status: AC | PRN

## 2011-09-23 MED ORDER — BISACODYL 10 MG RE SUPP: 10.0000 mg | Freq: Every day | RECTAL | Status: DC | PRN

## 2011-09-23 MED ORDER — ISOSORBIDE MONONITRATE ER 60 MG PO TB24
60.0000 mg | ORAL_TABLET | Freq: Every day | ORAL | Status: DC
  Administered 2011-09-24 – 2011-09-29 (×5): 60 mg via ORAL
  Filled 2011-09-23 (×7): qty 1

## 2011-09-23 MED ORDER — ONDANSETRON HCL 4 MG/2ML IJ SOLN
4.0000 mg | Freq: Two times a day (BID) | INTRAMUSCULAR | Status: DC
  Administered 2011-09-23: 4 mg via INTRAVENOUS
  Filled 2011-09-23: qty 2

## 2011-09-23 MED ORDER — POLYETHYLENE GLYCOL 3350 17 G PO PACK: 17.0000 g | PACK | Freq: Every day | ORAL | Status: DC | PRN

## 2011-09-23 MED ORDER — LISINOPRIL 10 MG PO TABS
10.0000 mg | ORAL_TABLET | Freq: Every day | ORAL | Status: DC
  Administered 2011-09-24 – 2011-09-29 (×6): 10 mg via ORAL
  Filled 2011-09-23 (×7): qty 1

## 2011-09-23 MED ORDER — OXYCODONE HCL 5 MG PO TABS: 5.0000 mg | ORAL_TABLET | ORAL | Status: DC | PRN

## 2011-09-23 MED ORDER — LACTATED RINGERS IV SOLN
INTRAVENOUS | Status: DC
  Administered 2011-09-24 – 2011-09-25 (×5): via INTRAVENOUS

## 2011-09-23 MED ORDER — ACETAMINOPHEN 650 MG RE SUPP: 650.0000 mg | Freq: Four times a day (QID) | RECTAL | Status: DC | PRN

## 2011-09-23 MED ORDER — ATORVASTATIN CALCIUM 40 MG PO TABS
40.0000 mg | ORAL_TABLET | Freq: Every day | ORAL | Status: DC
  Administered 2011-09-24 – 2011-09-29 (×5): 40 mg via ORAL
  Filled 2011-09-23 (×7): qty 1

## 2011-09-23 MED ORDER — ALPRAZOLAM 0.25 MG PO TABS
0.2500 mg | ORAL_TABLET | Freq: Every evening | ORAL | Status: DC | PRN
  Administered 2011-09-23: 0.25 mg via ORAL
  Filled 2011-09-23: qty 1

## 2011-09-23 MED ORDER — SODIUM CHLORIDE 0.9 % IJ SOLN: 3.0000 mL | Freq: Two times a day (BID) | INTRAMUSCULAR | Status: DC

## 2011-09-23 MED ORDER — LACTATED RINGERS IV SOLN
INTRAVENOUS | Status: DC
  Administered 2011-09-23: 100 mL/h via INTRAVENOUS

## 2011-09-23 MED ORDER — ONDANSETRON HCL 4 MG PO TABS
4.0000 mg | ORAL_TABLET | Freq: Two times a day (BID) | ORAL | Status: DC
  Administered 2011-09-24: 4 mg via ORAL
  Filled 2011-09-23 (×4): qty 1

## 2011-09-23 MED ORDER — FLEET ENEMA 7-19 GM/118ML RE ENEM: 1.0000 | ENEMA | Freq: Once | RECTAL | Status: DC | PRN

## 2011-09-23 MED ORDER — PANTOPRAZOLE SODIUM 40 MG PO TBEC
40.0000 mg | DELAYED_RELEASE_TABLET | Freq: Every day | ORAL | Status: DC
  Administered 2011-09-24 – 2011-09-29 (×5): 40 mg via ORAL
  Filled 2011-09-23 (×7): qty 1

## 2011-09-23 MED ORDER — ZOLPIDEM TARTRATE 5 MG PO TABS: 5.0000 mg | ORAL_TABLET | Freq: Every evening | ORAL | Status: DC | PRN

## 2011-09-23 MED ORDER — LACTATED RINGERS IV SOLN: INTRAVENOUS | Status: DC

## 2011-09-23 MED ORDER — TAMSULOSIN HCL 0.4 MG PO CAPS
0.4000 mg | ORAL_CAPSULE | Freq: Every day | ORAL | Status: DC
  Administered 2011-09-24 – 2011-09-29 (×6): 0.4 mg via ORAL
  Filled 2011-09-23 (×7): qty 1

## 2011-09-23 MED ORDER — OXYCODONE HCL 5 MG PO TABS
5.0000 mg | ORAL_TABLET | ORAL | Status: DC | PRN
  Administered 2011-09-23 – 2011-09-24 (×5): 10 mg via ORAL
  Filled 2011-09-23 (×5): qty 2

## 2011-09-23 MED ORDER — VANCOMYCIN HCL 1000 MG IV SOLR
2000.0000 mg | Freq: Once | INTRAVENOUS | Status: DC
  Filled 2011-09-23: qty 2000

## 2011-09-23 MED ORDER — VANCOMYCIN HCL 1000 MG IV SOLR
1500.0000 mg | INTRAVENOUS | Status: DC
  Administered 2011-09-24 – 2011-09-26 (×3): 1500 mg via INTRAVENOUS
  Filled 2011-09-23 (×5): qty 1500

## 2011-09-23 NOTE — Progress Notes (Signed)
Utilization review completed.  

## 2011-09-23 NOTE — H&P (Signed)
Joe [name] is an 76 y.o. male.   Chief Complaint: left knee pain  HPI: Joe [name] presented with the chief complaint of left knee pain. He reports that on Friday of last week he started having trouble with fever and chills. He reports that over the weekend he was feeling terrible. He also reports that the left total knee has been swollen and red. He denies any injury to the knee. He reports a history of a pin tract infection, with subsequent I&D by Dr. Thomasena [name] in April. Since then he has been on antibiotics for the infection. He reports feeling well until this past week. He denies nausea and vomiting and no bowel troubles. He denies recent infections, bladder, sinus, etc. He presented to the office this morning, where 60cc of frank pus was aspirated from the left knee joint. Due to his symptoms, history, and aspiration results he needs to to be admitted to [facility] with I&D of the left knee this week, tentative Thursday.  Past Medical History  Diagnosis Date  . Hypertension   . Hypercholesteremia   . Anxiety   . Obesity (BMI 35.0-39.9 without comorbidity)     Past Surgical History  Procedure Date  . Coronary artery bypass graft   . Joint replacement   . Foot surgery   . Leg surgery   . Appendectomy   . I&d extremity 04/19/2011    Procedure: IRRIGATION AND DEBRIDEMENT EXTREMITY;  Surgeon: Joe Mcalpine, MD;  Location: [facility];  Service: Orthopedics;  Laterality: Left;  aspiration of left total knee fluid and irrigation and debridement of left calf abscess  . Esophagogastroduodenoscopy 04/21/2011    Procedure: ESOPHAGOGASTRODUODENOSCOPY (EGD);  Surgeon: Joe Fiedler, MD;  Location: [facility];  Service: Gastroenterology;  Laterality: N/A;  . Esophagogastroduodenoscopy 04/22/2011    Procedure: ESOPHAGOGASTRODUODENOSCOPY (EGD);  Surgeon: Joe Fiedler, MD;  Location: [facility];  Service: Gastroenterology;  Laterality: N/A;  travel case    Social History:  reports that he has never smoked. He  has never used smokeless tobacco. He reports that he does not drink alcohol or use illicit drugs.  Allergies:  Allergies  Allergen Reactions  . Clarithromycin Rash   Temperature in the office 09/23/2011: 98 F  Review of Systems  Constitutional: Positive for fever, chills, weight loss and malaise/fatigue. Negative for diaphoresis.  HENT: Negative for hearing loss, congestion, sore throat, neck pain and tinnitus.   Eyes: Negative.   Respiratory: Negative.   Cardiovascular: Negative.   Gastrointestinal: Negative.   Genitourinary: Negative.   Musculoskeletal: Positive for myalgias and joint pain. Negative for back pain and falls.  Skin: Negative.   Neurological: Positive for headaches. Negative for weakness.  Endo/Heme/Allergies: Negative.   Psychiatric/Behavioral: Negative.     Physical Exam  Constitutional: He is oriented to person, place, and time. He appears well-developed and well-nourished. No distress.  HENT:  Head: Normocephalic and atraumatic.  Right Ear: External ear normal.  Left Ear: External ear normal.  Nose: Nose normal.  Mouth/Throat: Oropharynx is clear and moist.  Eyes: Conjunctivae and EOM are normal. Right eye exhibits no discharge. Left eye exhibits no discharge.  Neck: Normal range of motion. Neck supple. No tracheal deviation present. No thyromegaly present.  Cardiovascular: Normal rate, regular rhythm, normal heart sounds and intact distal pulses.   No murmur heard. Respiratory: Effort normal and breath sounds normal. No respiratory distress. He has no wheezes. He has no rales. He exhibits no tenderness.  GI: Soft. Bowel sounds are normal. He exhibits no  distension and no mass. There is no tenderness.  Musculoskeletal:       Right hip: Normal.       Left hip: Normal.       Right knee: Normal.       Left knee: He exhibits swelling, effusion and erythema. He exhibits normal range of motion. tenderness found.       Right ankle: Normal.       Left ankle:  Normal.       Legs: Lymphadenopathy:    He has no cervical adenopathy.  Neurological: He is alert and oriented to person, place, and time.  Skin: Skin is warm. No rash noted. He is not diaphoretic. No erythema. There is pallor.  Psychiatric: He has a normal mood and affect. His behavior is normal. Judgment and thought content normal.     Assessment/Plan He is showing signs of an infected left total knee. We are admitting him for IV antibiotics. We will have his PCP see him to get clearance for surgery. Hopefully Thursday, Joe [name] will be able to perform an I&D of the left total knee with possible poly exchange.   Joe [name] Joe [name] 09/23/2011, 9:22 AM

## 2011-09-23 NOTE — Progress Notes (Signed)
ANTIBIOTIC CONSULT NOTE - INITIAL  Pharmacy Consult for Vancomycin Indication: Infected LTKA  Allergies  Allergen Reactions  . Clarithromycin Rash    Patient Measurements: Height: 5\' 6"  (167.6 cm) Weight: 240 lb (108.863 kg) IBW/kg (Calculated) : 63.8  Adjusted Body Weight: 81.6kg  Vital Signs: Temp: 98 F (36.7 C) (09/03 1204) Temp src: Oral (09/03 1204) BP: 150/71 mmHg (09/03 1204) Pulse Rate: 67  (09/03 1204) Intake/Output from previous day:   Intake/Output from this shift:    Labs:  Basename 09/23/11 1127  WBC 11.8*  HGB 10.6*  PLT 161  LABCREA --  CREATININE 1.33   Estimated Creatinine Clearance: 56.4 ml/min (by C-G formula based on Cr of 1.33).  49 ml/min/1.10m2 (normalized)   Microbiology: No results found for this or any previous visit (from the past 720 hour(s)).  Urine culture ordered  Medical History: Past Medical History  Diagnosis Date  . Hypertension   . Hypercholesteremia   . Anxiety   . Obesity (BMI 35.0-39.9 without comorbidity)     Medications:  Scheduled:    . ondansetron  4 mg Oral Q12H   Or  . ondansetron (ZOFRAN) IV  4 mg Intravenous Q12H  . sodium chloride  3 mL Intravenous Q12H  . vancomycin  2,000 mg Intravenous Once  . DISCONTD: vancomycin  2,000 mg Intravenous Once   Infusions:    . lactated ringers 100 mL/hr (09/23/11 1200)   Assessment:  [name] presents with left knee pain, swelling and redness; reports subjective fever & chills  Admitted 9/3 for IV antibiotics s/p aspiration of frank puss from knee joint  He reports he has been taking po doxycycline for ~38months after a pin tract infection in April 2013  Scr and WBC slightly elevated, currently afebrile  Since weight >100kg, will use obese dosing nomogram  Goal of Therapy:  Vancomycin trough level 15-20 mcg/ml, consider decreasing to 10-82mcg/ml if no osteomyelitis or MRSA identified  Plan:   Vancomycin 2gm IV x 1, then 1500mg  IV q24h Check trough at  steady state Follow up renal function & cultures    Loralee Pacas, PharmD, BCPS Pager: [phone] 09/23/2011,12:35 PM

## 2011-09-24 LAB — TYPE AND SCREEN
ABO/RH(D): A POS
Antibody Screen: NEGATIVE

## 2011-09-24 LAB — CBC WITH DIFFERENTIAL/PLATELET
Basophils Absolute: 0 10*3/uL (ref 0.0–0.1)
Basophils Relative: 0 % (ref 0–1)
Eosinophils Absolute: 0.3 10*3/uL (ref 0.0–0.7)
Eosinophils Relative: 3 % (ref 0–5)
HCT: 31 % — ABNORMAL LOW (ref 39.0–52.0)
Hemoglobin: 10.1 g/dL — ABNORMAL LOW (ref 13.0–17.0)
Lymphocytes Relative: 11 % — ABNORMAL LOW (ref 12–46)
Lymphs Abs: 1.1 10*3/uL (ref 0.7–4.0)
MCH: 26.9 pg (ref 26.0–34.0)
MCHC: 32.6 g/dL (ref 30.0–36.0)
MCV: 82.7 fL (ref 78.0–100.0)
Monocytes Absolute: 1.2 10*3/uL — ABNORMAL HIGH (ref 0.1–1.0)
Monocytes Relative: 13 % — ABNORMAL HIGH (ref 3–12)
Neutro Abs: 6.9 10*3/uL (ref 1.7–7.7)
Neutrophils Relative %: 73 % (ref 43–77)
Platelets: 146 10*3/uL — ABNORMAL LOW (ref 150–400)
RBC: 3.75 MIL/uL — ABNORMAL LOW (ref 4.22–5.81)
RDW: 15.8 % — ABNORMAL HIGH (ref 11.5–15.5)
WBC: 9.5 10*3/uL (ref 4.0–10.5)

## 2011-09-24 LAB — APTT: aPTT: 40 seconds — ABNORMAL HIGH (ref 24–37)

## 2011-09-24 LAB — PROTIME-INR
INR: 1.23 (ref 0.00–1.49)
Prothrombin Time: 15.8 seconds — ABNORMAL HIGH (ref 11.6–15.2)

## 2011-09-24 NOTE — Progress Notes (Signed)
Subjective: Joe [name] is feeling ok today. Fevers and chills have somewhat resolved. Resting comfortably during rounds. No complaints of chest pain or shortness of breath. Tolerating IV Vancomycin well.    Objective: Vital signs in last 24 hours: Temp:  [98.2 F (36.8 C)-99.4 F (37.4 C)] 98.2 F (36.8 C) (09/04 1000) Pulse Rate:  [56-100] 56  (09/04 1000) Resp:  [16] 16  (09/04 1000) BP: (135-157)/(68-80) 157/74 mmHg (09/04 1000) SpO2:  [96 %-97 %] 96 % (09/04 1000)  Intake/Output from previous day: 09/03 0701 - 09/04 0700 In: 1560 [P.O.:360; I.V.:1200] Out: 1100 [Urine:1100] Intake/Output this shift: Total I/O In: 240 [P.O.:240] Out: -    Basename 09/24/11 0350 09/23/11 1127  HGB 10.1* 10.6*    Basename 09/24/11 0350 09/23/11 1127  WBC 9.5 11.8*  RBC 3.75* 3.88*  HCT 31.0* 32.1*  PLT 146* 161    Basename 09/23/11 1127  NA 129*  K 3.7  CL 96  CO2 20  BUN 26*  CREATININE 1.33  GLUCOSE 110*  CALCIUM 8.6    Basename 09/24/11 0350 09/23/11 1127  LABPT -- --  INR 1.23 1.22    Neurologically intact Intact pulses distally  Assessment/Plan: We will continue IV Vancomycin per pharmacy. Will take to OR tomorrow afternoon for I&D of left total knee with poly exchange. Did discuss possible placement of antibiotic spacer. He should be NPO after midnight.    Michaella Imai LAUREN 09/24/2011, 12:35 PM

## 2011-09-24 NOTE — Progress Notes (Signed)
Subjective: Joe [name] is seen today at the request of Dr. Jonny Ruiz free for medical consultation.  As the records will reflect, several months ago we had him in with a left lower extremity calf and questionable prosthetic infection.  He did undergo an incision and drainage as well as irrigation by Dr. Hayden Rasmussen who is covering for Dr. Annitta Jersey.  Infectious disease did see Joe [name] and he was discharged on a course of vancomycin IV via PICC line as well as oral rifampin indefinitely.  He stayed at [facility] for several weeks and transitioned home to his vancomycin.  This finished after 6 weeks and he is continued on rifampin without concern.  For several days this left knee has become more tender, swollen and painful to the extent that he presented yesterday to the orthopedic her office and several cc of frank purulent material were withdrawn.  He was admitted by Dr. Annitta Jersey in anticipation of surgical incision and drainage of this knee with possible insertion of an antibiotic impregnated prosthesis.  At the time of seeing Joe [name] this morning he is doing well only concern is some feverishness and malaise as well as the pain involving the left knee.  He's having no chest pain or shortness of breath.  He's having no neurologic symptoms.  He certainly not been toxic and has been eating without vomiting.  Objective: Vital signs in last 24 hours: Temp:  [97.8 F (36.6 C)-99.4 F (37.4 C)] 98.6 F (37 C) (09/04 0456) Pulse Rate:  [60-100] 60  (09/04 0456) Resp:  [15-16] 16  (09/04 0456) BP: (135-151)/(68-80) 135/80 mmHg (09/04 0456) SpO2:  [95 %-100 %] 97 % (09/04 0456) Weight:  [108.863 kg (240 lb)] 108.863 kg (240 lb) (09/03 1202) Weight change:   CBG (last 3)  No results found for this basename: GLUCAP:3 in the last 72 hours  Intake/Output from previous day: 09/03 0701 - 09/04 0700 In: 1560 [P.O.:360; I.V.:1200] Out: 1100 [Urine:1100]  Physical Exam: Patient is  awake alert actually wearing his regular close just returned from the bathroom.  Left knee is wrapped with an Ace bandage swollen tender not examined in detail as this will a been done by the orthopedic surgeon.  HEENT with good facial symmetry anicteric.  Neck no JVD or bruits.  Lungs are clear to auscultation and percussion.  Cardiovascular exam regular rate and rhythm no murmur gallop or rub or heave.  Abdomen is obese soft nontender no hepatosplenomegaly.  Upper extremities are normal.  I lateral lower extremities reveal good distal pulses with trace edema.  Neurologically he is nonlateralizing.   Lab Results:  [facility] 09/23/11 1127  NA 129*  K 3.7  CL 96  CO2 20  GLUCOSE 110*  BUN 26*  CREATININE 1.33  CALCIUM 8.6  MG --  PHOS --    Basename 09/23/11 1127  AST 16  ALT 10  ALKPHOS 78  BILITOT 0.3  PROT 7.3  ALBUMIN 2.5*    Basename 09/24/11 0350 09/23/11 1127  WBC 9.5 11.8*  NEUTROABS 6.9 9.8*  HGB 10.1* 10.6*  HCT 31.0* 32.1*  MCV 82.7 82.7  PLT 146* 161   Lab Results  Component Value Date   INR 1.23 09/24/2011   INR 1.22 09/23/2011   INR 1.18 04/24/2011   No results found for this basename: CKTOTAL:3,CKMB:3,CKMBINDEX:3,TROPONINI:3 in the last 72 hours No results found for this basename: TSH,T4TOTAL,FREET3,T3FREE,THYROIDAB in the last 72 hours No results found for this basename: VITAMINB12:2,FOLATE:2,FERRITIN:2,TIBC:2,IRON:2,RETICCTPCT:2 in the last 72 hours  Studies/Results: No results found.   Assessment/Plan: #1 left prosthetic joint/knee infection needing surgical intervention and probable long-term antibiotics will need a PICC line.  #2 coronary artery disease with remote coronary artery bypass grafting clinically stable  #3 essential hypertension stable  #4 hyperlipidemia stable  Plan we'll follow for medical support the imminent need appears to be surgical with continued antibiotics been decisions made for long-term therapy   LOS: 1 day    Roena Sassaman A 09/24/2011, 7:43 AM

## 2011-09-25 ENCOUNTER — Encounter (HOSPITAL_COMMUNITY): Payer: Self-pay | Admitting: Anesthesiology

## 2011-09-25 ENCOUNTER — Inpatient Hospital Stay (HOSPITAL_COMMUNITY): Payer: Medicare Other | Admitting: Anesthesiology

## 2011-09-25 ENCOUNTER — Inpatient Hospital Stay (HOSPITAL_COMMUNITY): Payer: Medicare Other

## 2011-09-25 ENCOUNTER — Encounter (HOSPITAL_COMMUNITY)
Admission: AD | Disposition: A | Discharge status: Skilled Nursing Facility | Payer: Self-pay | Source: Ambulatory Visit | Attending: Orthopedic Surgery

## 2011-09-25 DIAGNOSIS — T8459XA Infection and inflammatory reaction due to other internal joint prosthesis, initial encounter: Secondary | ICD-10-CM

## 2011-09-25 HISTORY — PX: I&D KNEE WITH POLY EXCHANGE: SHX5024

## 2011-09-25 LAB — MRSA PCR SCREENING: MRSA by PCR: NEGATIVE

## 2011-09-25 SURGERY — IRRIGATION AND DEBRIDEMENT KNEE WITH POLY EXCHANGE
Anesthesia: General | Site: Knee | Laterality: Left | Wound class: Dirty or Infected

## 2011-09-25 MED ORDER — BISACODYL 10 MG RE SUPP: 10.0000 mg | Freq: Every day | RECTAL | Status: DC | PRN

## 2011-09-25 MED ORDER — PROPOFOL 10 MG/ML IV BOLUS
INTRAVENOUS | Status: DC | PRN
  Administered 2011-09-25: 150 mg via INTRAVENOUS

## 2011-09-25 MED ORDER — PHENOL 1.4 % MT LIQD
1.0000 | OROMUCOSAL | Status: DC | PRN
  Filled 2011-09-25: qty 177

## 2011-09-25 MED ORDER — ACETAMINOPHEN 325 MG PO TABS
650.0000 mg | ORAL_TABLET | Freq: Four times a day (QID) | ORAL | Status: DC | PRN
  Filled 2011-09-25: qty 2

## 2011-09-25 MED ORDER — POLYETHYLENE GLYCOL 3350 17 G PO PACK
17.0000 g | PACK | Freq: Every day | ORAL | Status: DC | PRN
  Administered 2011-09-29: 17 g via ORAL

## 2011-09-25 MED ORDER — SODIUM CHLORIDE 0.9 % IJ SOLN
INTRAMUSCULAR | Status: DC | PRN
  Administered 2011-09-25: 20 mL

## 2011-09-25 MED ORDER — ALUM & MAG HYDROXIDE-SIMETH 200-200-20 MG/5ML PO SUSP: 30.0000 mL | ORAL | Status: DC | PRN

## 2011-09-25 MED ORDER — VANCOMYCIN HCL 1000 MG IV SOLR
INTRAVENOUS | Status: DC | PRN
  Administered 2011-09-25: 1000 mg

## 2011-09-25 MED ORDER — LACTATED RINGERS IV SOLN
INTRAVENOUS | Status: DC | PRN
  Administered 2011-09-25: 16:00:00 via INTRAVENOUS

## 2011-09-25 MED ORDER — SODIUM CHLORIDE 0.9 % IR SOLN
Status: DC | PRN
  Administered 2011-09-25: 15:00:00

## 2011-09-25 MED ORDER — LACTATED RINGERS IV SOLN: INTRAVENOUS | Status: DC

## 2011-09-25 MED ORDER — METHOCARBAMOL 100 MG/ML IJ SOLN
500.0000 mg | Freq: Four times a day (QID) | INTRAVENOUS | Status: DC | PRN
  Administered 2011-09-25: 500 mg via INTRAVENOUS
  Filled 2011-09-25: qty 5

## 2011-09-25 MED ORDER — OXYCODONE-ACETAMINOPHEN 5-325 MG PO TABS
2.0000 | ORAL_TABLET | ORAL | Status: DC | PRN
  Administered 2011-09-26 – 2011-09-29 (×12): 2 via ORAL
  Filled 2011-09-25 (×13): qty 2

## 2011-09-25 MED ORDER — METHOCARBAMOL 500 MG PO TABS
500.0000 mg | ORAL_TABLET | Freq: Four times a day (QID) | ORAL | Status: DC | PRN
  Administered 2011-09-26 (×2): 500 mg via ORAL
  Filled 2011-09-25 (×4): qty 1

## 2011-09-25 MED ORDER — FERROUS SULFATE 325 (65 FE) MG PO TABS
325.0000 mg | ORAL_TABLET | Freq: Three times a day (TID) | ORAL | Status: DC
  Administered 2011-09-25 – 2011-09-29 (×13): 325 mg via ORAL
  Filled 2011-09-25 (×15): qty 1

## 2011-09-25 MED ORDER — SUCCINYLCHOLINE CHLORIDE 20 MG/ML IJ SOLN
INTRAMUSCULAR | Status: DC | PRN
  Administered 2011-09-25: 100 mg via INTRAVENOUS

## 2011-09-25 MED ORDER — PROMETHAZINE HCL 25 MG/ML IJ SOLN: 6.2500 mg | INTRAMUSCULAR | Status: DC | PRN

## 2011-09-25 MED ORDER — TOBRAMYCIN SULFATE 1.2 G IJ SOLR
INTRAMUSCULAR | Status: DC | PRN
  Administered 2011-09-25: 1.2 g

## 2011-09-25 MED ORDER — MENTHOL 3 MG MT LOZG
1.0000 | LOZENGE | OROMUCOSAL | Status: DC | PRN
  Filled 2011-09-25: qty 9

## 2011-09-25 MED ORDER — BUPIVACAINE LIPOSOME 1.3 % IJ SUSP
INTRAMUSCULAR | Status: DC | PRN
  Administered 2011-09-25: 20 mL

## 2011-09-25 MED ORDER — HYDROMORPHONE HCL PF 1 MG/ML IJ SOLN
1.0000 mg | INTRAMUSCULAR | Status: DC | PRN
  Administered 2011-09-25 – 2011-09-26 (×2): 1 mg via INTRAVENOUS
  Filled 2011-09-25 (×2): qty 1

## 2011-09-25 MED ORDER — ACETAMINOPHEN 650 MG RE SUPP: 650.0000 mg | Freq: Four times a day (QID) | RECTAL | Status: DC | PRN

## 2011-09-25 MED ORDER — RIVAROXABAN 10 MG PO TABS
10.0000 mg | ORAL_TABLET | Freq: Every day | ORAL | Status: DC
  Administered 2011-09-26 – 2011-09-29 (×4): 10 mg via ORAL
  Filled 2011-09-25 (×6): qty 1

## 2011-09-25 MED ORDER — HYDROCODONE-ACETAMINOPHEN 5-325 MG PO TABS
1.0000 | ORAL_TABLET | ORAL | Status: DC | PRN
  Administered 2011-09-26 – 2011-09-27 (×2): 2 via ORAL
  Filled 2011-09-25 (×3): qty 2

## 2011-09-25 MED ORDER — HYDROMORPHONE HCL PF 1 MG/ML IJ SOLN
0.2500 mg | INTRAMUSCULAR | Status: DC | PRN
  Administered 2011-09-25 (×2): 0.5 mg via INTRAVENOUS

## 2011-09-25 MED ORDER — FENTANYL CITRATE 0.05 MG/ML IJ SOLN
INTRAMUSCULAR | Status: DC | PRN
  Administered 2011-09-25: 50 ug via INTRAVENOUS
  Administered 2011-09-25: 100 ug via INTRAVENOUS
  Administered 2011-09-25 (×3): 50 ug via INTRAVENOUS
  Administered 2011-09-25: 100 ug via INTRAVENOUS
  Administered 2011-09-25 (×2): 50 ug via INTRAVENOUS

## 2011-09-25 MED ORDER — ACETAMINOPHEN 10 MG/ML IV SOLN
INTRAVENOUS | Status: DC | PRN
  Administered 2011-09-25: 1000 mg via INTRAVENOUS

## 2011-09-25 MED ORDER — SODIUM CHLORIDE 0.9 % IR SOLN
Status: DC | PRN
  Administered 2011-09-25: 3000 mL

## 2011-09-25 MED ORDER — FLEET ENEMA 7-19 GM/118ML RE ENEM: 1.0000 | ENEMA | Freq: Once | RECTAL | Status: AC | PRN

## 2011-09-25 MED ORDER — 0.9 % SODIUM CHLORIDE (POUR BTL) OPTIME: TOPICAL | Status: DC | PRN

## 2011-09-25 MED ORDER — ONDANSETRON HCL 4 MG/2ML IJ SOLN: 4.0000 mg | Freq: Four times a day (QID) | INTRAMUSCULAR | Status: DC | PRN

## 2011-09-25 MED ORDER — VANCOMYCIN HCL IN DEXTROSE 1-5 GM/200ML-% IV SOLN
1000.0000 mg | Freq: Two times a day (BID) | INTRAVENOUS | Status: AC
  Administered 2011-09-26: 1000 mg via INTRAVENOUS
  Filled 2011-09-25: qty 200

## 2011-09-25 MED ORDER — ONDANSETRON HCL 4 MG PO TABS: 4.0000 mg | ORAL_TABLET | Freq: Four times a day (QID) | ORAL | Status: DC | PRN

## 2011-09-25 MED ORDER — LACTATED RINGERS IV SOLN
INTRAVENOUS | Status: DC
  Administered 2011-09-25 – 2011-09-29 (×3): via INTRAVENOUS

## 2011-09-25 MED ORDER — BUPIVACAINE LIPOSOME 1.3 % IJ SUSP
20.0000 mL | Freq: Once | INTRAMUSCULAR | Status: DC
  Filled 2011-09-25: qty 20

## 2011-09-25 MED ORDER — ONDANSETRON HCL 4 MG/2ML IJ SOLN
INTRAMUSCULAR | Status: DC | PRN
  Administered 2011-09-25: 4 mg via INTRAVENOUS

## 2011-09-25 MED ORDER — HYDROMORPHONE HCL PF 1 MG/ML IJ SOLN
INTRAMUSCULAR | Status: DC | PRN
  Administered 2011-09-25 (×5): .4 mg via INTRAVENOUS

## 2011-09-25 SURGICAL SUPPLY — 60 items
ANCH SUT 2 CP-2 EBND QANCHR+ (Anchor) ×2 IMPLANT
ANCHOR SUPER QUICK (Anchor) ×4 IMPLANT
BAG SPEC THK2 15X12 ZIP CLS (MISCELLANEOUS) ×1
BAG ZIPLOCK 12X15 (MISCELLANEOUS) ×2 IMPLANT
BANDAGE ELASTIC 4 VELCRO ST LF (GAUZE/BANDAGES/DRESSINGS) ×2 IMPLANT
BANDAGE ELASTIC 6 VELCRO ST LF (GAUZE/BANDAGES/DRESSINGS) ×2 IMPLANT
BANDAGE ESMARK 6X9 LF (GAUZE/BANDAGES/DRESSINGS) ×1 IMPLANT
BANDAGE GAUZE ELAST BULKY 4 IN (GAUZE/BANDAGES/DRESSINGS) ×1 IMPLANT
BNDG CMPR 9X6 STRL LF SNTH (GAUZE/BANDAGES/DRESSINGS) ×1
BNDG ESMARK 6X9 LF (GAUZE/BANDAGES/DRESSINGS) ×2
CLOTH BEACON ORANGE TIMEOUT ST (SAFETY) ×2 IMPLANT
CONT SPECI 4OZ STER CLIK (MISCELLANEOUS) ×2 IMPLANT
CUFF TOURN SGL QUICK 34 (TOURNIQUET CUFF) ×2
CUFF TRNQT CYL 34X4X40X1 (TOURNIQUET CUFF) ×1 IMPLANT
DECANTER SPIKE VIAL GLASS SM (MISCELLANEOUS) ×2 IMPLANT
DRAPE EXTREMITY T 121X128X90 (DRAPE) ×2 IMPLANT
DRAPE POUCH INSTRU U-SHP 10X18 (DRAPES) ×2 IMPLANT
DRAPE U-SHAPE 47X51 STRL (DRAPES) ×2 IMPLANT
DRSG ADAPTIC 3X8 NADH LF (GAUZE/BANDAGES/DRESSINGS) ×2 IMPLANT
DRSG PAD ABDOMINAL 8X10 ST (GAUZE/BANDAGES/DRESSINGS) ×3 IMPLANT
DURAPREP 26ML APPLICATOR (WOUND CARE) ×2 IMPLANT
ELECT REM PT RETURN 9FT ADLT (ELECTROSURGICAL) ×2
ELECTRODE REM PT RTRN 9FT ADLT (ELECTROSURGICAL) ×1 IMPLANT
EVACUATOR 1/8 PVC DRAIN (DRAIN) ×2 IMPLANT
FACESHIELD LNG OPTICON STERILE (SAFETY) ×10 IMPLANT
FLOSEAL (HEMOSTASIS) ×2 IMPLANT
GLOVE BIOGEL PI IND STRL 8 (GLOVE) ×1 IMPLANT
GLOVE BIOGEL PI INDICATOR 8 (GLOVE) ×1
GLOVE ECLIPSE 8.0 STRL XLNG CF (GLOVE) IMPLANT
GOWN BRE IMP PREV XXLGXLNG (GOWN DISPOSABLE) ×4 IMPLANT
GOWN STRL NON-REIN LRG LVL3 (GOWN DISPOSABLE) ×2 IMPLANT
HANDPIECE INTERPULSE COAX TIP (DISPOSABLE) ×2
IMMOBILIZER KNEE 20 (SOFTGOODS) ×2
IMMOBILIZER KNEE 20 THIGH 36 (SOFTGOODS) IMPLANT
INSERT TIBIAL TC3 RP SZ5 12.5 (Knees) ×1 IMPLANT
KIT BASIN OR (CUSTOM PROCEDURE TRAY) ×2 IMPLANT
KIT STIMULAN RAPID CURE  10CC (Orthopedic Implant) ×1 IMPLANT
KIT STIMULAN RAPID CURE 10CC (Orthopedic Implant) IMPLANT
MANIFOLD NEPTUNE II (INSTRUMENTS) ×2 IMPLANT
NS IRRIG 1000ML POUR BTL (IV SOLUTION) ×4 IMPLANT
PACK TOTAL JOINT (CUSTOM PROCEDURE TRAY) ×2 IMPLANT
PADDING CAST COTTON 6X4 STRL (CAST SUPPLIES) ×4 IMPLANT
POSITIONER SURGICAL ARM (MISCELLANEOUS) ×2 IMPLANT
SET HNDPC FAN SPRY TIP SCT (DISPOSABLE) ×1 IMPLANT
SPONGE GAUZE 4X4 12PLY (GAUZE/BANDAGES/DRESSINGS) ×3 IMPLANT
SPONGE LAP 18X18 X RAY DECT (DISPOSABLE) ×2 IMPLANT
STAPLER SKIN PROX WIDE 3.9 (STAPLE) ×2 IMPLANT
STAPLER VISISTAT 35W (STAPLE) ×1 IMPLANT
STRIP CLOSURE SKIN 1/2X4 (GAUZE/BANDAGES/DRESSINGS) ×4 IMPLANT
SUCTION FRAZIER 12FR DISP (SUCTIONS) ×2 IMPLANT
SUT VIC AB 1 CT1 36 (SUTURE) ×6 IMPLANT
SUT VIC AB 2-0 CT1 27 (SUTURE) ×6
SUT VIC AB 2-0 CT1 TAPERPNT 27 (SUTURE) ×3 IMPLANT
SWAB COLLECTION DEVICE MRSA (MISCELLANEOUS) ×2 IMPLANT
SYR CONTROL 10ML LL (SYRINGE) ×2 IMPLANT
TOWEL OR 17X26 10 PK STRL BLUE (TOWEL DISPOSABLE) ×4 IMPLANT
TRAY FOLEY CATH 14FRSI W/METER (CATHETERS) ×2 IMPLANT
TUBE ANAEROBIC SPECIMEN COL (MISCELLANEOUS) ×2 IMPLANT
WATER STERILE IRR 1500ML POUR (IV SOLUTION) ×1 IMPLANT
WRAP KNEE MAXI GEL POST OP (GAUZE/BANDAGES/DRESSINGS) ×2 IMPLANT

## 2011-09-25 NOTE — Transfer of Care (Signed)
Immediate Anesthesia Transfer of Care Note  Patient: Joe [name]  Procedure(s) Performed: Procedure(s) (LRB) with comments: IRRIGATION AND DEBRIDEMENT KNEE WITH POLY EXCHANGE (Left) - removal patella  Patient Location: PACU  Anesthesia Type: General  Level of Consciousness: awake, alert , oriented and patient cooperative  Airway & Oxygen Therapy: Patient Spontanous Breathing and Patient connected to face mask oxygen  Post-op Assessment: Report given to PACU RN, Post -op Vital signs reviewed and stable and Patient moving all extremities  Post vital signs: Reviewed and stable  Complications: No apparent anesthesia complications

## 2011-09-25 NOTE — Preoperative (Signed)
Beta Blockers   Reason not to administer Beta Blockers:Not Applicable 

## 2011-09-25 NOTE — H&P (View-Only) (Signed)
Subjective: Joe [name] is seen this morning a routine rounds.  He's in good spirits awaiting surgery.  He is comfortable might  Objective: Vital signs in last 24 hours: Temp:  [98.6 F (37 C)-99.3 F (37.4 C)] 98.7 F (37.1 C) (09/05 0505) Pulse Rate:  [54-61] 58  (09/05 0505) Resp:  [16] 16  (09/05 0505) BP: (144-161)/(72-81) 153/81 mmHg (09/05 0505) SpO2:  [97 %-99 %] 99 % (09/05 0505) Weight change:   CBG (last 3)  No results found for this basename: GLUCAP:3 in the last 72 hours  Intake/Output from previous day: 09/04 0701 - 09/05 0700 In: 3715 [P.O.:960; I.V.:2755] Out: 2125 [Urine:2125]  Physical Exam: Patient awake alert no distress.  Good facial symmetry.  No JVD or bruits.  Lungs are clear to auscultation percussion.  Cardiovascular exam regular rate and rhythm no murmur gallop rub or heave.  Abdomen is soft and nontender.  Lower extremities distal neurovascular are intact.   Lab Results:  [facility] 09/23/11 1127  NA 129*  K 3.7  CL 96  CO2 20  GLUCOSE 110*  BUN 26*  CREATININE 1.33  CALCIUM 8.6  MG --  PHOS --    Basename 09/23/11 1127  AST 16  ALT 10  ALKPHOS 78  BILITOT 0.3  PROT 7.3  ALBUMIN 2.5*    Basename 09/24/11 0350 09/23/11 1127  WBC 9.5 11.8*  NEUTROABS 6.9 9.8*  HGB 10.1* 10.6*  HCT 31.0* 32.1*  MCV 82.7 82.7  PLT 146* 161   Lab Results  Component Value Date   INR 1.23 09/24/2011   INR 1.22 09/23/2011   INR 1.18 04/24/2011   No results found for this basename: CKTOTAL:3,CKMB:3,CKMBINDEX:3,TROPONINI:3 in the last 72 hours No results found for this basename: TSH,T4TOTAL,FREET3,T3FREE,THYROIDAB in the last 72 hours No results found for this basename: VITAMINB12:2,FOLATE:2,FERRITIN:2,TIBC:2,IRON:2,RETICCTPCT:2 in the last 72 hours  Studies/Results: No results found.   Assessment/Plan: #1 infected left prosthetic knee awaiting I&D and possible replacement today by Dr. Annitta Jersey continue antibiotics I anticipate a long-term need  #2  coronary artery disease with remote coronary artery bypass grafting stable  #3 essential hypertension stable  #4 hyperlipidemia stable  I will follow perioperatively is clearly medically stable for surgery today   LOS: 2 days   Joe [name] A 09/25/2011, 12:21 PM

## 2011-09-25 NOTE — Interval H&P Note (Signed)
History and Physical Interval Note:  09/25/2011 1:36 PM  Joe [name]  has presented today for surgery, with the diagnosis of Infected Left Total Knee Arthroplasty  The various methods of treatment have been discussed with the patient and family. After consideration of risks, benefits and other options for treatment, the patient has consented to  Procedure(s) (LRB) with comments: IRRIGATION AND DEBRIDEMENT KNEE WITH POLY EXCHANGE (Left) - I&D of Left Total Knee Arthroplasty with Poly Exchange as a surgical intervention .  The patient's history has been reviewed, patient examined, no change in status, stable for surgery.  I have reviewed the patient's chart and labs.  Questions were answered to the patient's satisfaction.     Adreanna Fickel A

## 2011-09-25 NOTE — Brief Op Note (Signed)
09/23/2011 - 09/25/2011  4:06 PM  PATIENT:  Joe [name]  76 y.o. male  PRE-OPERATIVE DIAGNOSIS:  Infected Left Total Knee Arthroplasty, Chronic  POST-OPERATIVE DIAGNOSIS:  Infected Left Total Knee Arthroplasty,Chronic  PROCEDURE:  Procedure(s) (LRB) with comments: IRRIGATION AND DEBRIDEMENT KNEE WITH POLY EXCHANGE (Left) - removal patella Complex Synovectomy SURGEON:  Surgeon(s) and Role:    * Jacki Cones, MD - Primary  PHYSICIAN ASSISTANT: Dimitri Ped PA    ANESTHESIA:   general  EBL:  Total I/O In: 1825 [I.V.:1825] Out: 825 [Urine:825]  BLOOD ADMINISTERED:none  DRAINS: (1) Hemovact drain(s) in the Left Knee with  Suction Open   LOCAL MEDICATIONS USED:  BUPIVICAINE 20cc mixed with 20cc of Normal Saline  SPECIMEN:  Source of Specimen:  Left Knee Joint  DISPOSITION OF SPECIMEN:  PATHOLOGY  COUNTS:  YES  TOURNIQUET:   Total Tourniquet Time Documented: Thigh (Left) - 96 minutes  DICTATION: .Other Dictation: Dictation Number [id number]  PLAN OF CARE: Admit to inpatient   PATIENT DISPOSITION:  Stable in OR   Delay start of Pharmacological VTE agent (>24hrs) due to surgical blood loss or risk of bleeding: yes

## 2011-09-25 NOTE — Progress Notes (Signed)
Subjective: Ready for Surgery today. Case thoroughly with patient.   Objective: Vital signs in last 24 hours: Temp:  [98.2 F (36.8 C)-99.3 F (37.4 C)] 98.7 F (37.1 C) (09/05 0505) Pulse Rate:  [54-61] 58  (09/05 0505) Resp:  [16] 16  (09/05 0505) BP: (144-161)/(72-81) 153/81 mmHg (09/05 0505) SpO2:  [96 %-99 %] 99 % (09/05 0505)  Intake/Output from previous day: 09/04 0701 - 09/05 0700 In: 3715 [P.O.:960; I.V.:2755] Out: 2125 [Urine:2125] Intake/Output this shift:     Basename 09/24/11 0350 09/23/11 1127  HGB 10.1* 10.6*    Basename 09/24/11 0350 09/23/11 1127  WBC 9.5 11.8*  RBC 3.75* 3.88*  HCT 31.0* 32.1*  PLT 146* 161    Basename 09/23/11 1127  NA 129*  K 3.7  CL 96  CO2 20  BUN 26*  CREATININE 1.33  GLUCOSE 110*  CALCIUM 8.6    Basename 09/24/11 0350 09/23/11 1127  LABPT -- --  INR 1.23 1.22    Neurologically intact  Assessment/Plan: Surgery Today.   Joe [name] 09/25/2011, 7:23 AM

## 2011-09-25 NOTE — Anesthesia Postprocedure Evaluation (Signed)
  Anesthesia Post-op Note  Patient: Joe [name]  Procedure(s) Performed: Procedure(s) (LRB): IRRIGATION AND DEBRIDEMENT KNEE WITH POLY EXCHANGE (Left)  Patient Location: PACU  Anesthesia Type: General  Level of Consciousness: awake and alert   Airway and Oxygen Therapy: Patient Spontanous Breathing  Post-op Pain: mild  Post-op Assessment: Post-op Vital signs reviewed, Patient's Cardiovascular Status Stable, Respiratory Function Stable, Patent Airway and No signs of Nausea or vomiting  Post-op Vital Signs: stable  Complications: No apparent anesthesia complications

## 2011-09-25 NOTE — Anesthesia Preprocedure Evaluation (Signed)
Anesthesia Evaluation  Patient identified by MRN, date of birth, ID band Patient awake    Reviewed: Allergy & Precautions, H&P , NPO status , Patient's Chart, lab work & pertinent test results  Airway Mallampati: II TM Distance: >3 FB Neck ROM: Full    Dental No notable dental hx.    Pulmonary neg pulmonary ROS,  breath sounds clear to auscultation  Pulmonary exam normal       Cardiovascular hypertension, Pt. on medications + CAD negative cardio ROS  + dysrhythmias Rhythm:Regular Rate:Normal     Neuro/Psych Anxiety negative neurological ROS     GI/Hepatic Neg liver ROS, GERD-  Medicated,  Endo/Other  negative endocrine ROS  Renal/GU negative Renal ROS  negative genitourinary   Musculoskeletal negative musculoskeletal ROS (+)   Abdominal (+) + obese,   Peds negative pediatric ROS (+)  Hematology negative hematology ROS (+)   Anesthesia Other Findings   Reproductive/Obstetrics negative OB ROS                           Anesthesia Physical Anesthesia Plan  ASA: III  Anesthesia Plan: General   Post-op Pain Management:    Induction: Intravenous  Airway Management Planned: Oral ETT  Additional Equipment:   Intra-op Plan:   Post-operative Plan: Extubation in OR  Informed Consent: I have reviewed the patients History and Physical, chart, labs and discussed the procedure including the risks, benefits and alternatives for the proposed anesthesia with the patient or authorized representative who has indicated his/her understanding and acceptance.   Dental advisory given  Plan Discussed with: CRNA  Anesthesia Plan Comments:         Anesthesia Quick Evaluation

## 2011-09-25 NOTE — Progress Notes (Signed)
Subjective: Joe [name] is seen this morning a routine rounds.  He's in good spirits awaiting surgery.  He is comfortable might  Objective: Vital signs in last 24 hours: Temp:  [98.6 F (37 C)-99.3 F (37.4 C)] 98.7 F (37.1 C) (09/05 0505) Pulse Rate:  [54-61] 58  (09/05 0505) Resp:  [16] 16  (09/05 0505) BP: (144-161)/(72-81) 153/81 mmHg (09/05 0505) SpO2:  [97 %-99 %] 99 % (09/05 0505) Weight change:   CBG (last 3)  No results found for this basename: GLUCAP:3 in the last 72 hours  Intake/Output from previous day: 09/04 0701 - 09/05 0700 In: 3715 [P.O.:960; I.V.:2755] Out: 2125 [Urine:2125]  Physical Exam: Patient awake alert no distress.  Good facial symmetry.  No JVD or bruits.  Lungs are clear to auscultation percussion.  Cardiovascular exam regular rate and rhythm no murmur gallop rub or heave.  Abdomen is soft and nontender.  Lower extremities distal neurovascular are intact.   Lab Results:  Basename 09/23/11 1127  NA 129*  K 3.7  CL 96  CO2 20  GLUCOSE 110*  BUN 26*  CREATININE 1.33  CALCIUM 8.6  MG --  PHOS --    Basename 09/23/11 1127  AST 16  ALT 10  ALKPHOS 78  BILITOT 0.3  PROT 7.3  ALBUMIN 2.5*    Basename 09/24/11 0350 09/23/11 1127  WBC 9.5 11.8*  NEUTROABS 6.9 9.8*  HGB 10.1* 10.6*  HCT 31.0* 32.1*  MCV 82.7 82.7  PLT 146* 161   Lab Results  Component Value Date   INR 1.23 09/24/2011   INR 1.22 09/23/2011   INR 1.18 04/24/2011   No results found for this basename: CKTOTAL:3,CKMB:3,CKMBINDEX:3,TROPONINI:3 in the last 72 hours No results found for this basename: TSH,T4TOTAL,FREET3,T3FREE,THYROIDAB in the last 72 hours No results found for this basename: VITAMINB12:2,FOLATE:2,FERRITIN:2,TIBC:2,IRON:2,RETICCTPCT:2 in the last 72 hours  Studies/Results: No results found.   Assessment/Plan: #1 infected left prosthetic knee awaiting I&D and possible replacement today by Dr. Joffrey continue antibiotics I anticipate a long-term need  #2  coronary artery disease with remote coronary artery bypass grafting stable  #3 essential hypertension stable  #4 hyperlipidemia stable  I will follow perioperatively is clearly medically stable for surgery today   LOS: 2 days   Dereck Agerton A 09/25/2011, 12:21 PM  

## 2011-09-26 ENCOUNTER — Inpatient Hospital Stay (HOSPITAL_COMMUNITY): Payer: Medicare Other

## 2011-09-26 ENCOUNTER — Encounter (HOSPITAL_COMMUNITY): Payer: Self-pay | Admitting: Orthopedic Surgery

## 2011-09-26 DIAGNOSIS — T8450XA Infection and inflammatory reaction due to unspecified internal joint prosthesis, initial encounter: Secondary | ICD-10-CM

## 2011-09-26 DIAGNOSIS — Y831 Surgical operation with implant of artificial internal device as the cause of abnormal reaction of the patient, or of later complication, without mention of misadventure at the time of the procedure: Secondary | ICD-10-CM

## 2011-09-26 LAB — BASIC METABOLIC PANEL
CO2: 20 mEq/L (ref 19–32)
Calcium: 8.6 mg/dL (ref 8.4–10.5)
Chloride: 96 mEq/L (ref 96–112)
Glucose, Bld: 130 mg/dL — ABNORMAL HIGH (ref 70–99)
Sodium: 130 mEq/L — ABNORMAL LOW (ref 135–145)

## 2011-09-26 LAB — CBC
Hemoglobin: 11.1 g/dL — ABNORMAL LOW (ref 13.0–17.0)
MCH: 27.6 pg (ref 26.0–34.0)
MCV: 83.8 fL (ref 78.0–100.0)
RBC: 4.02 MIL/uL — ABNORMAL LOW (ref 4.22–5.81)
WBC: 8.4 10*3/uL (ref 4.0–10.5)

## 2011-09-26 MED ORDER — SODIUM CHLORIDE 0.9 % IJ SOLN
10.0000 mL | INTRAMUSCULAR | Status: DC | PRN
  Administered 2011-09-27: 10 mL
  Administered 2011-09-27: 20 mL
  Administered 2011-09-27 – 2011-09-29 (×4): 10 mL

## 2011-09-26 NOTE — Progress Notes (Signed)
Peripherally Inserted Central Catheter/Midline Placement  The IV Nurse has discussed with the patient and/or persons authorized to consent for the patient, the purpose of this procedure and the potential benefits and risks involved with this procedure.  The benefits include less needle sticks, lab draws from the catheter and patient may be discharged home with the catheter.  Risks include, but not limited to, infection, bleeding, blood clot (thrombus formation), and puncture of an artery; nerve damage and irregular heat beat.  Alternatives to this procedure were also discussed.  PICC/Midline Placement Documentation  PICC / Midline Double Lumen 04/20/11 PICC Right Basilic (Active)       Stacie Glaze Horton 09/26/2011, 7:25 PM

## 2011-09-26 NOTE — Consult Note (Signed)
[facility]  ? Prolonged rifampin or doxycycline        Day 4 vancomycin               Reason for Consult: Persistent left prosthetic knee infection  Referring Physician: Dr. Worthy Rancher    Active Problems:  Infection of total knee replacement      . atorvastatin  40 mg Oral Daily  . ferrous sulfate  325 mg Oral TID PC  . isosorbide mononitrate  60 mg Oral Daily  . lisinopril  10 mg Oral Daily  . pantoprazole  40 mg Oral Daily  . rivaroxaban  10 mg Oral Q breakfast  . Tamsulosin HCl  0.4 mg Oral Daily  . vancomycin  1,500 mg Intravenous Q24H  . vancomycin  1,000 mg Intravenous Q12H  . DISCONTD: bupivacaine liposome  20 mL Infiltration Once  . DISCONTD: ondansetron (ZOFRAN) IV  4 mg Intravenous Q12H  . DISCONTD: ondansetron  4 mg Oral Q12H  . DISCONTD: sodium chloride  3 mL Intravenous Q12H    Recommendations: 1. Continue vancomycin for 6 weeks postoperatively 2. PICC Placement  3. I will arrange follow up in our clinic within the next 4 weeks to help decide on chronic suppressive oral antibiotic therapy 4. Please call Dr. Judyann Munson [phone]) for any ID questions this weekend  Assessment: Joe [name] has persistent left prosthetic knee infection. I suspect that G. strep as the sole culprit but will continue IV vancomycin to cover for both the possibility of coagulase-negative staph and strep. I will not continue rifampin at this time since it's not indicated with streptococcal prosthetic joint infection and if he has been on it alone he is probably resistant to this point. It is unlikely that this infection will be curable with medical therapy alone but I am hopeful that it can be suppressed more effectively with an alternative oral regimen such as amoxicillin. I will plan on treating him for 6 weeks with IV vancomycin and follow him up in our clinic soon to help with the transition to oral therapy.    HPI: Joe [name] is a 76 y.o. male who  has undergone multiple procedures on his left knee. He had an initial arthroplasty and then required a revision arthroplasty many years ago. In March he presented with a calf abscess and cultures from the prosthetic knee itself also grew group G. strep and coagulase-negative staph. He was treated with 6 weeks of IV vancomycin and oral rifampin. It appears that he was continued on oral therapy beyond that time but it is unclear to me what exactly what he was taking. One note indicates that he was on oral rifampin and another note indicates that he was on oral doxycycline. He is unable to tell me the names of the antibiotics that he has been on.  Unfortunately, his left knee infection flared again recently. The joint was tapped several days ago as an outpatient and those cultures are growing group G. strep again. He was admitted and underwent incision and drainage with poly-exchange. He is feeling better and is eager to go home.   Review of Systems: Pertinent items are noted in HPI.  Past Medical History  Diagnosis Date  . Hypertension   . Hypercholesteremia   . Anxiety   . Obesity (BMI 35.0-39.9 without comorbidity)     History  Substance Use Topics  . Smoking status: Never Smoker   . Smokeless tobacco: Never Used  .  Alcohol Use: No    History reviewed. No pertinent family history. Allergies  Allergen Reactions  . Clarithromycin Rash    OBJECTIVE: Blood pressure 130/74, pulse 55, temperature 99 F (37.2 C), temperature source Oral, resp. rate 16, height 5\' 6"  (1.676 m), weight 108.863 kg (240 lb), SpO2 92.00%. General: He is in no distress sitting up in a chair Skin: No rash Lungs: Clear Cor: Regular S1 and S2 no murmurs His left leg is in an immobilizer and his drain is still in place  Microbiology: Recent Results (from the past 240 hour(s))  MRSA PCR SCREENING     Status: Normal   Collection Time   09/25/11 11:00 AM      Component Value Range Status Comment   MRSA by PCR  NEGATIVE  NEGATIVE Final   WOUND CULTURE     Status: Normal (Preliminary result)   Collection Time   09/25/11  2:28 PM      Component Value Range Status Comment   Specimen Description WOUND LEFT KNEE JOINT   Final    Special Requests NONE   Final    Gram Stain     Final    Value: RARE WBC PRESENT, PREDOMINANTLY PMN     NO SQUAMOUS EPITHELIAL CELLS SEEN     NO ORGANISMS SEEN   Culture NO GROWTH 1 DAY   Final    Report Status PENDING   Incomplete   ANAEROBIC CULTURE     Status: Normal (Preliminary result)   Collection Time   09/25/11  2:28 PM      Component Value Range Status Comment   Specimen Description WOUND LEFT KNEE JOINT   Final    Special Requests NONE   Final    Gram Stain     Final    Value: RARE WBC PRESENT, PREDOMINANTLY PMN     NO SQUAMOUS EPITHELIAL CELLS SEEN     NO ORGANISMS SEEN   Culture PENDING   Incomplete    Report Status PENDING   Incomplete     Cliffton Asters, MD [facility] [phone] pager   [phone] cell 09/26/2011, 10:24 AM

## 2011-09-26 NOTE — Progress Notes (Signed)
Subjective: Johnn seems to be doing well. His postop day #1 from a left total knee revision and clean out. He denies chest pain or shortness of breath. Moods and spirits are good. Pain is adequately controlled. He's having no nausea vomiting.  Objective: Vital signs in last 24 hours: Temp:  [97.3 F (36.3 C)-99 F (37.2 C)] 99 F (37.2 C) (09/06 0457) Pulse Rate:  [54-72] 55  (09/06 0457) Resp:  [8-16] 16  (09/06 0457) BP: (119-174)/(51-84) 130/74 mmHg (09/06 0457) SpO2:  [97 %-100 %] 100 % (09/06 0457) Weight change:   CBG (last 3)  No results found for this basename: GLUCAP:3 in the last 72 hours  Intake/Output from previous day: 09/05 0701 - 09/06 0700 In: 3504.9 [P.O.:480; I.V.:2969.9; IV Piggyback:55] Out: 2055 [Urine:1775; Drains:280]  Physical Exam: Patient is awake alert no distress. Good facial symmetry. Higher cortical functioning intact. No JVD or bruits. Lungs are clear to auscultation and percussion. Cardiovascular exam reveals intact sternotomy scar regular rate and rhythm no murmur. Abdomen is obese soft nontender good bowel sounds. Extremities reveal intact distal neurovascular. Left knee is dressed. Neurologic exam is normal.   Lab Results:  Basename 09/26/11 0406 09/23/11 1127  NA 130* 129*  K 4.2 3.7  CL 96 96  CO2 20 20  GLUCOSE 130* 110*  BUN 20 26*  CREATININE 1.13 1.33  CALCIUM 8.6 8.6  MG -- --  PHOS -- --    Basename 09/23/11 1127  AST 16  ALT 10  ALKPHOS 78  BILITOT 0.3  PROT 7.3  ALBUMIN 2.5*    Basename 09/26/11 0406 09/24/11 0350 09/23/11 1127  WBC 8.4 9.5 --  NEUTROABS -- 6.9 9.8*  HGB 11.1* 10.1* --  HCT 33.7* 31.0* --  MCV 83.8 82.7 --  PLT 196 146* --   Lab Results  Component Value Date   INR 1.23 09/24/2011   INR 1.22 09/23/2011   INR 1.18 04/24/2011   No results found for this basename: CKTOTAL:3,CKMB:3,CKMBINDEX:3,TROPONINI:3 in the last 72 hours No results found for this basename: TSH,T4TOTAL,FREET3,T3FREE,THYROIDAB in  the last 72 hours No results found for this basename: VITAMINB12:2,FOLATE:2,FERRITIN:2,TIBC:2,IRON:2,RETICCTPCT:2 in the last 72 hours  Studies/Results: X-ray Knee Left Port  09/25/2011  *RADIOLOGY REPORT*  Clinical Data: Postop knee arthroplasty.  PORTABLE LEFT KNEE - 1-2 VIEW  Comparison: 04/15/2011  Findings: Knee arthroplasty is again seen in stable position. Chronic deformity the patella is again demonstrated. No acute fracture or dislocation.  There has been placed of numerous antibiotic beads surrounding the knee joint.  Surgical drain and overlying skin staples are noted.  IMPRESSION: Knee arthroplasty in expected position.  No acute bone abnormality. Numerous antibiotic beads surrounding the knee joint.   Original Report Authenticated By: Danae Orleans, M.D.      Assessment/Plan: #1 infected left prosthetic knee status post revision and clean out antibiotics continue question whether we need to get infectious disease involved for recommendations on duration of therapy but we'll leave this up to orthopedic surgery. Depending on initial culture may just need 6 weeks of IV vancomycin again with continued indefinite rifampin  #2 coronary artery disease with remote coronary artery bypass grafting currently stable no concerns  #3 essential hypertension stable  #4 hyperlipidemia stable  Plan antibiotics medically stable will follow   LOS: 3 days   Analise Glotfelty A 09/26/2011, 6:52 AM

## 2011-09-26 NOTE — Care Management Note (Addendum)
    Page 1 of 2   09/29/2011     4:41:17 PM   CARE MANAGEMENT NOTE 09/29/2011  Patient:  Joe [name], Joe [name]   Account Number:  [id number]  Date Initiated:  09/26/2011  Documentation initiated by:  Colleen Can  Subjective/Objective Assessment:   DX INFECTED LEFT TOTAL KNEE 'iNCISION AND Drainage done 09/25/2011     Action/Plan:   Pt currently planning to go home with spouse but if necessary would consider SNF([facility]). Already has DME  Active with [facility]   Anticipated DC Date:  09/29/2011   Anticipated DC Plan:  snf rehab  In-house referral  Clinical Social Worker      DC Planning Services  CM consult      [facility] Choice  NA   Choice offered to / List presented to:  NA   DME arranged  NA      DME agency  NA     HH arranged  NA      HH agency  NA   Status of service:  Completed, signed off Medicare Important Message given?  NO (If response is "NO", the following Medicare IM given date fields will be blank) Date Medicare IM given:   Date Additional Medicare IM given:    Discharge Disposition:    Per UR Regulation:  Reviewed for med. necessity/level of care/duration of stay  If discussed at Long Length of Stay Meetings, dates discussed:    Comments:  09/29/2011 Raynelle Bring BSN CCM [phone] Plans have changed to SNF rehab/referral to CSW/anticipate SNF discharge.

## 2011-09-26 NOTE — Progress Notes (Signed)
PT note:  Pt deferred second therapy session.  Will see in am tomorrow.   Thanks,  Clovia Cuff, PT

## 2011-09-26 NOTE — Progress Notes (Signed)
ANTIBIOTIC CONSULT NOTE - Follow Up  Pharmacy Consult for Vancomycin Indication: Infected L TKA  Allergies  Allergen Reactions  . Clarithromycin Rash    Patient Measurements: Height: 5\' 6"  (167.6 cm) Weight: 240 lb (108.863 kg) IBW/kg (Calculated) : 63.8  Adjusted Body Weight: 81.6kg  Vital Signs: Temp: 98.4 F (36.9 C) (09/06 1050) Temp src: Oral (09/06 0457) BP: 110/56 mmHg (09/06 1050) Pulse Rate: 57  (09/06 1050) Intake/Output from previous day: 09/05 0701 - 09/06 0700 In: 3504.9 [P.O.:480; I.V.:2969.9; IV Piggyback:55] Out: 2055 [Urine:1775; Drains:280] Intake/Output from this shift: Total I/O In: 240 [P.O.:240] Out: 250 [Urine:250]  Labs:  [facility] 09/26/11 0406 09/24/11 0350  WBC 8.4 9.5  HGB 11.1* 10.1*  PLT 196 146*  LABCREA -- --  CREATININE 1.13 --   Estimated Creatinine Clearance: 66.4 ml/min (by C-G formula based on Cr of 1.13).  49 ml/min/1.69m2 (normalized)   Microbiology: Recent Results (from the past 720 hour(s))  MRSA PCR SCREENING     Status: Normal   Collection Time   09/25/11 11:00 AM      Component Value Range Status Comment   MRSA by PCR NEGATIVE  NEGATIVE Final   WOUND CULTURE     Status: Normal (Preliminary result)   Collection Time   09/25/11  2:28 PM      Component Value Range Status Comment   Specimen Description WOUND LEFT KNEE JOINT   Final    Special Requests NONE   Final    Gram Stain     Final    Value: RARE WBC PRESENT, PREDOMINANTLY PMN     NO SQUAMOUS EPITHELIAL CELLS SEEN     NO ORGANISMS SEEN   Culture NO GROWTH 1 DAY   Final    Report Status PENDING   Incomplete   ANAEROBIC CULTURE     Status: Normal (Preliminary result)   Collection Time   09/25/11  2:28 PM      Component Value Range Status Comment   Specimen Description WOUND LEFT KNEE JOINT   Final    Special Requests NONE   Final    Gram Stain     Final    Value: RARE WBC PRESENT, PREDOMINANTLY PMN     NO SQUAMOUS EPITHELIAL CELLS SEEN     NO ORGANISMS SEEN   Culture PENDING   Incomplete    Report Status PENDING   Incomplete     Urine culture ordered  Medical History: Past Medical History  Diagnosis Date  . Hypertension   . Hypercholesteremia   . Anxiety   . Obesity (BMI 35.0-39.9 without comorbidity)     Medications:  Scheduled:     . atorvastatin  40 mg Oral Daily  . ferrous sulfate  325 mg Oral TID PC  . isosorbide mononitrate  60 mg Oral Daily  . lisinopril  10 mg Oral Daily  . pantoprazole  40 mg Oral Daily  . rivaroxaban  10 mg Oral Q breakfast  . Tamsulosin HCl  0.4 mg Oral Daily  . vancomycin  1,500 mg Intravenous Q24H  . vancomycin  1,000 mg Intravenous Q12H  . DISCONTD: bupivacaine liposome  20 mL Infiltration Once  . DISCONTD: ondansetron (ZOFRAN) IV  4 mg Intravenous Q12H  . DISCONTD: ondansetron  4 mg Oral Q12H  . DISCONTD: sodium chloride  3 mL Intravenous Q12H   Infusions:     . lactated ringers 20 mL/hr at 09/26/11 0658  . DISCONTD: lactated ringers 100 mL/hr (09/23/11 1200)  . DISCONTD: lactated ringers  Stopped (09/25/11 1545)  . DISCONTD: lactated ringers     Assessment:  22 YOM presents with left knee pain, swelling and redness; reports subjective fever & chills  Admitted 9/3 for IV antibiotics s/p aspiration of frank puss from knee joint  He reports he has been taking po doxycycline for ~83months after a pin tract infection in April 2013   Now s/p I&D and inserted Stimulan antibiotic beads on 9/5  Today is Day #4 IV Vanco - per ID, plan is 6 weeks of Vanco therapy  Goal of Therapy:  Vancomycin trough level 15-20 mcg/ml  Plan:   Continue Vanco 1500mg  IV q24h Trough to be checked tomorrow before noon dose.  Darrol Angel, PharmD Pager: [phone] 09/26/2011,11:39 AM

## 2011-09-26 NOTE — Op Note (Signed)
NAMEPHUONG, MOFFATT                  ACCOUNT NO.:  [id number]  MEDICAL RECORD NO.:  [id number]  LOCATION:  1603                         FACILITY:  [facility]  PHYSICIAN:  Georges Lynch. Laurelin Elson, M.D.DATE OF BIRTH:  [birth year]  DATE OF PROCEDURE:  09/25/2011 DATE OF DISCHARGE:                              OPERATIVE REPORT   SURGEON:  Georges Lynch. Darrelyn Hillock, MD  ASSISTANT:  Dimitri Ped, PA  OPERATION: 1. EXTENSIVE, COMPLEX, debridement including synovectomy of an     infected left revision, total knee.  Note, he has had a chronic     infection and he was treated with antibiotics here about a year ago     and his infection recurred. 2. I did excise the patellar component, curetted out all the cement     thoroughly from the patella, left 3. I have to do a quadriceps snip, left. 4. Had a polyethylene revision of the left total knee. 5. We inserted Stimulan antibiotic beads.  PROCEDURE:  Under general anesthesia with the patient in the supine position, a routine orthopedic prep of the left knee was first carried out, then a sterile prep.  Note, I marked the appropriate left leg in the holding area and the appropriate time-out was carried out in the operating room before surgery was started.  At this time, the leg was placed in a [facility] knee holder, and then exsanguinated.  An Esmarch tourniquet was elevated at 325 mmHg.  At this particular time, an incision was made over the anterior aspect of the left knee.  Bleeders were cauterized.  I created 2 flaps.  I then carried out a median parapatellar incision.  Immediately upon going down into the knee, a large amount of fluid was extracted.  Cultures for aerobic and anaerobic were taken.  The patient was on vancomycin at that time preop. Following that, I then did a quadriceps snip because the knee was extremely tight from his previous 2 surgeries.  I reflected the patella laterally and utilized a pin to hold the patella in place.  Following that,  I did an extensive synovectomy and debridement, then removed a patellar component which was extremely loose.  I then curetted out all the loose pieces of cement from the patella as well as the synovium.  We thoroughly curetted it out.  I then completed my synovectomy and then with the knee flexed, I brought the tibia forward and removed the tibial component.  At this time, the component was measured to be a size 5 stem, that was 15 mm thickness.  I then went back in posteriorly, thoroughly water picked out the knee.  We debrided the knee quite nicely.  After the thorough debridement, we all changed gloves and then re-irrigated the wound out with some saline solution with a water pick. Following that, we utilized antibiotic solution.  At this time, we then reinserted a size 5, 12.5 mm thickness insert because that was extremely too tight, and we had basically no flexion, about 15 degrees of flexion of the knee.  After this was reduced, we thoroughly irrigated out the knee again.  A Hemovac drain was inserted.  I then inserted the Stimulan  antibiotic beads mixed with 2 antibiotic solutions, gentamicin and tobramycin.  Following that, we then loosely closed the wound over the drain in usual fashion.  The case EXTREMELY COMPLEX as I mentioned.  Sterile dressings were applied.          ______________________________ Georges Lynch Darrelyn Hillock, M.D.     RAG/MEDQ  D:  09/25/2011  T:  09/26/2011  Job:  [id number]  cc:   Geoffry Paradise, M.D. Fax: [phone]

## 2011-09-26 NOTE — Progress Notes (Signed)
Subjective: Dressing changed and wound looks fine. Will leave drain in until tomorrow. Awaiting Culture report.Will call Infectious disease consult.   Objective: Vital signs in last 24 hours: Temp:  [97.3 F (36.3 C)-99 F (37.2 C)] 99 F (37.2 C) (09/06 0457) Pulse Rate:  [54-72] 55  (09/06 0457) Resp:  [8-16] 16  (09/06 0457) BP: (119-174)/(51-84) 130/74 mmHg (09/06 0457) SpO2:  [97 %-100 %] 100 % (09/06 0457)  Intake/Output from previous day: 09/05 0701 - 09/06 0700 In: 3504.9 [P.O.:480; I.V.:2969.9; IV Piggyback:55] Out: 2055 [Urine:1775; Drains:280] Intake/Output this shift:     Basename 09/26/11 0406 09/24/11 0350 09/23/11 1127  HGB 11.1* 10.1* 10.6*    Basename 09/26/11 0406 09/24/11 0350  WBC 8.4 9.5  RBC 4.02* 3.75*  HCT 33.7* 31.0*  PLT 196 146*    Basename 09/26/11 0406 09/23/11 1127  NA 130* 129*  K 4.2 3.7  CL 96 96  CO2 20 20  BUN 20 26*  CREATININE 1.13 1.33  GLUCOSE 130* 110*  CALCIUM 8.6 8.6    Basename 09/24/11 0350 09/23/11 1127  LABPT -- --  INR 1.23 1.22    Neurologically intact  Assessment/Plan: Consult with Infectious disease and pull drain Sat.   Joe [name] A 09/26/2011, 7:22 AM

## 2011-09-26 NOTE — Evaluation (Signed)
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 This is pt's 5th knee sx.  Skilled OT recommended to maximize independence with BADLs to supervision level in prep for d/c home with HHOT.    OT Assessment  Patient needs continued OT Services    Follow Up Recommendations  Home health OT    Barriers to Discharge      Equipment Recommendations  None recommended by OT    Recommendations for Other Services    Frequency  Min 2X/week    Precautions / Restrictions Precautions Precautions: Knee Required Braces or Orthoses: Knee Immobilizer - Left Knee Immobilizer - Left: Discontinue once straight leg raise with < 10 degree lag Restrictions Weight Bearing Restrictions: Yes LLE Weight Bearing: Partial weight bearing LLE Partial Weight Bearing Percentage or Pounds: 50%   Pertinent Vitals/Pain Reported 4/10 pain. Repositioned for comfort. Refused ice.    ADL  Grooming: Simulated;Set up Where Assessed - Grooming: Unsupported sitting Upper Body Bathing: Simulated;Set up Where Assessed - Upper Body Bathing: Unsupported sitting Lower Body Bathing: Simulated;+2 Total assistance Lower Body Bathing: Patient Percentage: 70% Where Assessed - Lower Body Bathing: Supported sit to stand Upper Body Dressing: Simulated;Set up Where Assessed - Upper Body Dressing: Unsupported sitting Lower Body Dressing: Simulated;+2 Total assistance Lower Body Dressing: Patient Percentage: 70% Where Assessed - Lower Body Dressing: Supported sit to stand Toilet Transfer: Simulated;+2 Total assistance Toilet Transfer: Patient Percentage: 70% Statistician Method: Stand pivot Acupuncturist: Other (comment) Nurse, children's) Toileting -  Clothing Manipulation and Hygiene: Simulated;+2 Total assistance Toileting - Architect and Hygiene: Patient Percentage: 70% Where Assessed - Glass blower/designer Manipulation and Hygiene: Standing Equipment Used: Rolling walker Transfers/Ambulation Related to ADLs: Pt argumentative. Took max encouragement for pt to just get OOB and sit in chair. Refused any further activity.    OT Diagnosis: Generalized weakness  OT Problem List: Decreased activity tolerance;Decreased safety awareness;Pain OT Treatment Interventions: Self-care/ADL training;Therapeutic activities;DME and/or AE instruction;Patient/family education   OT Goals Acute Rehab OT Goals OT Goal Formulation: With patient Time For Goal Achievement: 10/10/11 Potential to Achieve Goals: Good ADL Goals Pt Will Perform Grooming: with supervision;Standing at sink ADL Goal: Grooming - Progress: Goal set today Pt Will Perform Lower Body Bathing: with supervision;Sit to stand from chair;Sit to stand from bed ADL Goal: Lower Body Bathing - Progress: Goal set today Pt Will Perform Lower Body Dressing: with supervision;Sit to stand from bed;Sit to stand from chair ADL Goal: Lower Body Dressing - Progress: Goal set today Pt Will Transfer to Toilet: with supervision;Maintaining weight bearing status;Comfort height toilet;3-in-1;Ambulation ADL Goal: Toilet Transfer - Progress: Goal set today Pt Will Perform Toileting - Clothing Manipulation: with supervision;Sitting on 3-in-1 or toilet;Standing ADL Goal: Toileting - Clothing Manipulation - Progress: Goal set today Pt Will Perform Toileting - Hygiene: with supervision;Sit to stand from 3-in-1/toilet ADL Goal: Toileting - Hygiene - Progress: Goal set today Pt Will Perform Tub/Shower Transfer: Shower transfer;with supervision;Maintaining weight bearing status;Ambulation ADL Goal: Tub/Shower Transfer - Progress: Goal set today  Visit Information  Last OT Received On: 09/26/11 Assistance  Needed: +2 PT/OT Co-Evaluation/Treatment: Yes    Subjective Data  Subjective: Were not going walking are we? Patient Stated Goal: The same thing I was doing before this happened.  Prior Functioning  Vision/Perception  Home Living Lives With: Spouse Available Help at Discharge: Family Type of Home: House Home Access: Stairs to enter Secretary/administrator of Steps: 1 Home Layout: One level Bathroom Shower/Tub: Health visitor: Handicapped height Home Adaptive Equipment: Grab bars in shower;Grab bars around toilet;Walker - rolling;Other (comment);Hospital bed;Shower chair with back (overhead trapeze) Prior Function Level of Independence: Independent Driving: Yes Vocation: Retired Musician: No difficulties Dominant Hand: Right      Cognition  Overall Cognitive Status: Appears within functional limits for tasks assessed/performed Arousal/Alertness: Awake/alert Orientation Level: Appears intact for tasks assessed Behavior During Session: [facility] for tasks performed    Extremity/Trunk Assessment Right Upper Extremity Assessment RUE ROM/Strength/Tone: [facility] for tasks assessed Left Upper Extremity Assessment LUE ROM/Strength/Tone: WFL for tasks assessed Right Lower Extremity Assessment RLE ROM/Strength/Tone: [facility] for tasks assessed Left Lower Extremity Assessment LLE ROM/Strength/Tone: Deficits LLE ROM/Strength/Tone Deficits: ankle motions WFL, not able to perform SLR without assist against gravity.    Mobility  Shoulder Instructions  Bed Mobility Bed Mobility: Supine to Sit;Sitting - Scoot to Edge of Bed Supine to Sit: 1: +2 Total assist;HOB elevated;With rails (with trapeze) Supine to Sit: Patient Percentage: 60% Sitting - Scoot to Edge of Bed: 5: Supervision Details for Bed Mobility Assistance: Noted pt to put fourth little effort during session with max cues for hand placement and technique.  Assist for B LE's off of bed and for trunk to  attain sitting.  Transfers Transfers: Sit to Stand;Stand to Sit Sit to Stand: 1: +2 Total assist;From elevated surface;With upper extremity assist;From bed Sit to Stand: Patient Percentage: 70% Stand to Sit: 1: +2 Total assist;With upper extremity assist;With armrests;To chair/3-in-1 Stand to Sit: Patient Percentage: 70% Details for Transfer Assistance: Max cues for hand placement and LLE management when sitting/standing.  Only agreed to take a few steps to chair with cues for sequencing/technique, upright posture and to maintain PWB status.        Exercise     Balance     End of Session OT - End of Session Activity Tolerance: Other (comment) (refused any further activity without reason.) Patient left: in chair;with call bell/phone within reach  GO     Mouna Yager A OTR/L [phone] 09/26/2011, 10:02 AM

## 2011-09-27 LAB — WOUND CULTURE

## 2011-09-27 LAB — BASIC METABOLIC PANEL
Chloride: 97 mEq/L (ref 96–112)
GFR calc Af Amer: 63 mL/min — ABNORMAL LOW (ref >90)
GFR calc non Af Amer: 54 mL/min — ABNORMAL LOW (ref >90)
Potassium: 3.8 mEq/L (ref 3.5–5.1)
Sodium: 131 mEq/L — ABNORMAL LOW (ref 135–145)

## 2011-09-27 LAB — CBC
HCT: 27.6 % — ABNORMAL LOW (ref 39.0–52.0)
Platelets: 273 10*3/uL (ref 150–400)
RDW: 15.8 % — ABNORMAL HIGH (ref 11.5–15.5)
WBC: 7.8 10*3/uL (ref 4.0–10.5)

## 2011-09-27 LAB — VANCOMYCIN, TROUGH: Vancomycin Tr: 27 ug/mL (ref 10.0–20.0)

## 2011-09-27 MED ORDER — VANCOMYCIN HCL IN DEXTROSE 1-5 GM/200ML-% IV SOLN
1000.0000 mg | INTRAVENOUS | Status: DC
  Administered 2011-09-27 – 2011-09-28 (×2): 1000 mg via INTRAVENOUS
  Filled 2011-09-27 (×2): qty 200

## 2011-09-27 NOTE — Progress Notes (Signed)
Orthopedics Progress Note  Subjective: Pt sitting up comfortably with mild pain to left knee s/p poly exchange Iv antibiotics continue  Objective:  Filed Vitals:   09/27/11 0500  BP: 146/74  Pulse: 74  Temp: 99.2 F (37.3 C)  Resp: 16    General: Awake and alert  Musculoskeletal: left knee incision healing well. Drain had become detached thus drain pulled today Neurovascularly intact  Lab Results  Component Value Date   WBC 7.8 09/27/2011   HGB 9.0* 09/27/2011   HCT 27.6* 09/27/2011   MCV 82.6 09/27/2011   PLT 273 09/27/2011       Component Value Date/Time   NA 131* 09/27/2011 0510   K 3.8 09/27/2011 0510   CL 97 09/27/2011 0510   CO2 23 09/27/2011 0510   GLUCOSE 101* 09/27/2011 0510   BUN 18 09/27/2011 0510   CREATININE 1.26 09/27/2011 0510   CALCIUM 8.0* 09/27/2011 0510   GFRNONAA 54* 09/27/2011 0510   GFRAA 63* 09/27/2011 0510    Lab Results  Component Value Date   INR 1.23 09/24/2011   INR 1.22 09/23/2011   INR 1.18 04/24/2011    Assessment/Plan: POD # s/p Procedure(s):left IRRIGATION AND DEBRIDEMENT KNEE WITH POLY EXCHANGE  Continue Iv antibiotics Plan for d/c on Monday PT/OT as able  Viviann Spare R. Ranell Patrick, MD 09/27/2011 7:20 AM

## 2011-09-27 NOTE — Progress Notes (Signed)
09/711 Nursing 0300 Patient's hemovac partially pulled out , unable to reattach. Wrapped loose end in gauze.

## 2011-09-27 NOTE — Evaluation (Addendum)
Physical Therapy Evaluation  Patient Details Name: Joe [name] MRN: [id number] DOB: [birth year] Today's Date: 09/26/2011 (Entered on 9/7) Time: 0906-0920 PT Time Calculation (min): 14 min  PT Assessment / Plan / Recommendation Clinical Impression   Pt presents s/p I and D of L knee with polyexchange POD 1 with decreased strength, ROM and mobiltiy. Pt very adamant about not walking, and only agreed to get OOB to chair. Pt will benefit from skilled PT in acute venue to address deficits. PT recommends HHPT vs STSNF pending progress     PT Assessment  Patient needs continued PT services    Follow Up Recommendations  Home health PT;Skilled nursing facility    Barriers to Discharge        Equipment Recommendations  None recommended by PT    Recommendations for Other Services     Frequency 7X/week    Precautions / Restrictions Precautions Precautions: Knee Required Braces or Orthoses: Knee Immobilizer - Left Knee Immobilizer - Left: Discontinue once straight leg raise with < 10 degree lag Restrictions Weight Bearing Restrictions: Yes LLE Weight Bearing: Partial weight bearing LLE Partial Weight Bearing Percentage or Pounds: 50%   Pertinent Vitals/Pain 10/10 with mobility       Mobility  Bed Mobility Bed Mobility: Supine to Sit;Sitting - Scoot to Edge of Bed Supine to Sit: 1: +2 Total assist;HOB elevated;With rails (with trapeze) Supine to Sit: Patient Percentage: 60% Sitting - Scoot to Edge of Bed: 5: Supervision Details for Bed Mobility Assistance: Noted pt to put fourth little effort during session with max cues for hand placement and technique.  Assist for B LE's off of bed and for trunk to attain sitting.  Transfers Transfers: Sit to Stand;Stand to Sit;Stand Pivot Transfers Sit to Stand: 1: +2 Total assist;From elevated surface;With upper extremity assist;From bed Sit to Stand: Patient Percentage: 70% Stand to Sit: 1: +2 Total assist;With upper extremity assist;With  armrests;To chair/3-in-1 Stand to Sit: Patient Percentage: 70% Stand Pivot Transfers: 1: +2 Total assist Stand Pivot Transfers: Patient Percentage: 70% Details for Transfer Assistance: Max cues for hand placement and LLE management when sitting/standing.  Only agreed to take a few steps to chair with cues for sequencing/technique, upright posture and to maintain PWB status.  Ambulation/Gait Ambulation/Gait Assistance: 1: +2 Total assist Ambulation/Gait: Patient Percentage: 70% Ambulation Distance (Feet): 20 Feet Assistive device: Rolling walker Ambulation/Gait Assistance Details: Max cues for sequencing/technique with RW and to maintain upright posture. Pt also c/o pain in R ankle.  Gait Pattern: Step-to pattern;Trunk flexed Stairs: No Wheelchair Mobility Wheelchair Mobility: No    Exercises     PT Diagnosis: Difficulty walking;Generalized weakness;Acute pain  PT Problem List: Decreased strength;Decreased range of motion;Decreased activity tolerance;Decreased balance;Decreased mobility;Decreased knowledge of use of DME;Decreased safety awareness;Pain PT Treatment Interventions: DME instruction;Gait training;Functional mobility training;Therapeutic activities;Therapeutic exercise;Balance training;Patient/family education   PT Goals Acute Rehab PT Goals PT Goal Formulation: With patient Time For Goal Achievement: 10/01/11 Potential to Achieve Goals: Fair Pt will go Supine/Side to Sit: with supervision PT Goal: Supine/Side to Sit - Progress: Progressing toward goal Pt will go Sit to Supine/Side: with supervision PT Goal: Sit to Supine/Side - Progress: Goal set today Pt will go Sit to Stand: with supervision PT Goal: Sit to Stand - Progress: Progressing toward goal Pt will go Stand to Sit: with supervision PT Goal: Stand to Sit - Progress: Progressing toward goal Pt will Ambulate: 51 - 150 feet;with supervision;with least restrictive assistive device PT Goal: Ambulate - Progress:  Progressing toward  goal Pt will Perform Home Exercise Program: with supervision, verbal cues required/provided PT Goal: Perform Home Exercise Program - Progress: Goal set today  Visit Information  Last PT Received On: 09/27/11 Assistance Needed: +2    Subjective Data  Subjective: I'm not doing any walking today.  Patient Stated Goal: to doing what I want to do when I want to do it.    Prior Functioning  Home Living Lives With: Spouse Available Help at Discharge: Family Type of Home: House Home Access: Stairs to enter Secretary/administrator of Steps: 1 Home Layout: One level Bathroom Shower/Tub: Health visitor: Handicapped height Home Adaptive Equipment: Grab bars in shower;Grab bars around toilet;Walker - rolling;Other (comment);Hospital bed;Shower chair with back (overhead trapeze) Prior Function Level of Independence: Independent Driving: Yes Vocation: Retired Musician: No difficulties Dominant Hand: Right    Cognition  Overall Cognitive Status: Appears within functional limits for tasks assessed/performed Arousal/Alertness: Awake/alert Orientation Level: Appears intact for tasks assessed Behavior During Session: [facility] for tasks performed Cognition - Other Comments: Pt somewhat agitated, states that there is a family member here that he doesn't want to see.     Extremity/Trunk Assessment Right Lower Extremity Assessment RLE ROM/Strength/Tone: [facility] for tasks assessed Left Lower Extremity Assessment LLE ROM/Strength/Tone: Deficits LLE ROM/Strength/Tone Deficits: ankle motions WFL, not able to perform SLR without assist against gravity.    Balance    End of Session PT - End of Session Equipment Utilized During Treatment: Left knee immobilizer;Gait belt Activity Tolerance: Patient limited by pain Patient left: in chair;with call bell/phone within reach Nurse Communication: Mobility status  GP     Page, Meribeth Mattes 09/27/2011, 10:04  AM

## 2011-09-27 NOTE — Progress Notes (Signed)
09/27/11 0400 Nursing Iv team confirmed picc placement by chest xray.picc line is ready to use

## 2011-09-27 NOTE — Progress Notes (Signed)
ANTIBIOTIC CONSULT NOTE - FOLLOW UP  Pharmacy Consult for Vancomycin Indication: Infected L TKA  Allergies  Allergen Reactions  . Clarithromycin Rash    Patient Measurements: Height: 5\' 6"  (167.6 cm) Weight: 240 lb (108.863 kg) IBW/kg (Calculated) : 63.8  Adjusted Body Weight: 82kg  Vital Signs: Temp: 99.2 F (37.3 C) (09/07 0500) Temp src: Oral (09/07 0500) BP: 146/Joe mmHg (09/07 0500) Pulse Rate: Joe  (09/07 0500) Intake/Output from previous day: 09/06 0701 - 09/07 0700 In: 2805.7 [P.O.:480; I.V.:1825.7; IV Piggyback:500] Out: 2775 [Urine:2775] Intake/Output from this shift: Total I/O In: 20 [I.V.:20] Out: 300 [Urine:300]  Labs:  [facility] 09/27/11 0510 09/26/11 0406  WBC 7.8 8.4  HGB 9.0* 11.1*  PLT 273 196  LABCREA -- --  CREATININE 1.26 1.13   Estimated Creatinine Clearance: 59.5 ml/min (by C-G formula based on Cr of 1.26).  Basename 09/27/11 1055  VANCOTROUGH 27.0*  VANCOPEAK --  Drue Dun --  GENTTROUGH --  GENTPEAK --  GENTRANDOM --  TOBRATROUGH --  TOBRAPEAK --  TOBRARND --  AMIKACINPEAK --  AMIKACINTROU --  AMIKACIN --     Microbiology: Recent Results (from the past 720 hour(s))  MRSA PCR SCREENING     Status: Normal   Collection Time   09/25/11 11:00 AM      Component Value Range Status Comment   MRSA by PCR NEGATIVE  NEGATIVE Final   WOUND CULTURE     Status: Normal (Preliminary result)   Collection Time   09/25/11  2:28 PM      Component Value Range Status Comment   Specimen Description WOUND LEFT KNEE JOINT   Final    Special Requests NONE   Final    Gram Stain     Final    Value: RARE WBC PRESENT, PREDOMINANTLY PMN     NO SQUAMOUS EPITHELIAL CELLS SEEN     NO ORGANISMS SEEN   Culture NO GROWTH 1 DAY   Final    Report Status PENDING   Incomplete   ANAEROBIC CULTURE     Status: Normal (Preliminary result)   Collection Time   09/25/11  2:28 PM      Component Value Range Status Comment   Specimen Description WOUND LEFT KNEE JOINT    Final    Special Requests NONE   Final    Gram Stain     Final    Value: RARE WBC PRESENT, PREDOMINANTLY PMN     NO SQUAMOUS EPITHELIAL CELLS SEEN     NO ORGANISMS SEEN   Culture     Final    Value: NO ANAEROBES ISOLATED; CULTURE IN PROGRESS FOR 5 DAYS   Report Status PENDING   Incomplete     Anti-infectives     Start     Dose/Rate Route Frequency Ordered Stop   09/26/11 0000   vancomycin (VANCOCIN) IVPB 1000 mg/200 mL premix     Comments: Pharmacy Protocol      1,000 mg 200 mL/hr over 60 Minutes Intravenous Every 12 hours 09/25/11 1803 09/26/11 0144   09/25/11 1453   polymyxin B 500,000 Units, bacitracin 50,000 Units in sodium chloride irrigation 0.9 % 500 mL irrigation  Status:  Discontinued          As needed 09/25/11 1454 09/25/11 1620   09/25/11 1437   tobramycin (NEBCIN) powder  Status:  Discontinued          As needed 09/25/11 1437 09/25/11 1620   09/25/11 1437   vancomycin (VANCOCIN) powder  Status:  Discontinued          As needed 09/25/11 1437 09/25/11 1620   09/24/11 1200   vancomycin (VANCOCIN) 1,500 mg in sodium chloride 0.9 % 500 mL IVPB        1,500 mg 250 mL/hr over 120 Minutes Intravenous Every 24 hours 09/23/11 1244     09/23/11 1200   vancomycin (VANCOCIN) 2,000 mg in sodium chloride 0.9 % 500 mL IVPB        2,000 mg 125 mL/hr over 240 Minutes Intravenous  Once 09/23/11 1153 09/23/11 1601   09/23/11 1130   vancomycin (VANCOCIN) 2,000 mg in sodium chloride 0.9 % 500 mL IVPB  Status:  Discontinued        2,000 mg 250 mL/hr over 120 Minutes Intravenous  Once 09/23/11 1036 09/23/11 1400         Assessment:  Joe [name] presents with left knee pain, swelling and redness; reports subjective fever & chills  Admitted 9/3 for IV antibiotics s/p aspiration of frank puss from knee joint  He reports he has been taking po doxycycline for ~96months after a pin tract infection in April 2013   Now s/p I&D and inserted Stimulan antibiotic beads on 9/5  Today is Day #4  IV Vanco - per ID, plan is 6 weeks of Vanco therapy  Vanc trough is above target range.  SCr slightly elevated since admission. CrCl 34ml/min.  Afebrile and WBCs wnl.  Goal of Therapy:  Vancomycin trough level 15-20 mcg/ml  Plan:   Reduce Vancomycin to 1g IV q24h starting tonight.  Measure Vanc trough at steady state.  Follow up renal fxn and culture results.  Charolotte Eke, PharmD, pager [phone]. 09/27/2011,12:07 PM.

## 2011-09-27 NOTE — Progress Notes (Signed)
Physical Therapy Treatment Patient Details Name: Joe [name] MRN: [id number] DOB: [birth year] Today's Date: 09/27/2011 Time: 7829-5621 PT Time Calculation (min): 26 min  PT Assessment / Plan / Recommendation Comments on Treatment Session  Pt continues to demonstrate unsafe mobility during session and requires +2 for bed mobiltiy and for safety.  Spoke with pt and wife about ST SNF and wife agrees that it would be safest option.     Follow Up Recommendations  Home health PT;Skilled nursing facility    Barriers to Discharge        Equipment Recommendations  None recommended by OT;None recommended by PT    Recommendations for Other Services    Frequency 7X/week   Plan Discharge plan remains appropriate    Precautions / Restrictions Precautions Precautions: Knee;Fall Required Braces or Orthoses: Knee Immobilizer - Left Knee Immobilizer - Left: Discontinue once straight leg raise with < 10 degree lag Restrictions Weight Bearing Restrictions: Yes LLE Weight Bearing: Partial weight bearing LLE Partial Weight Bearing Percentage or Pounds: 50%   Pertinent Vitals/Pain 10/10, more pain in R ankle than L knee, RN aware and pain meds given.     Mobility  Bed Mobility Bed Mobility: Supine to Sit Supine to Sit: 1: +2 Total assist;HOB elevated;With rails Supine to Sit: Patient Percentage: 50% Details for Bed Mobility Assistance: Pt again noted to not put fourth much effort in assisting to get EOB.  Requires assist for LLE out of bed and some for trunk with MAX cuing for technique and hand placement.  Transfers Transfers: Sit to Stand;Stand to Sit;Stand Pivot Transfers Sit to Stand: 1: +2 Total assist;From bed;From elevated surface;With upper extremity assist Sit to Stand: Patient Percentage: 70% Stand to Sit: 1: +2 Total assist;To chair/3-in-1;With upper extremity assist Stand to Sit: Patient Percentage: 70% Stand Pivot Transfers: 1: +2 Total assist Stand Pivot Transfers: Patient  Percentage: 70% Details for Transfer Assistance: Pt continues to require max cuing for proper hand placement and safety when sitting/standing.  Pt also demos impulsive and unsafe behavior when getting up from bed this pm with urgency to use restroom.  Ambulation/Gait Ambulation/Gait Assistance: 1: +2 Total assist Ambulation/Gait: Patient Percentage: 70% Ambulation Distance (Feet): 12 Feet (then another 12') Assistive device: Rolling walker Ambulation/Gait Assistance Details: Max cues for sequencing/technique with RW and to maintain upright posture.  Pt continues to c/o R ankle pain.  Gait Pattern: Step-to pattern;Trunk flexed    Exercises     PT Diagnosis:    PT Problem List:   PT Treatment Interventions:     PT Goals Acute Rehab PT Goals PT Goal Formulation: With patient Time For Goal Achievement: 10/01/11 Potential to Achieve Goals: Fair Pt will go Supine/Side to Sit: with supervision PT Goal: Supine/Side to Sit - Progress: Progressing toward goal Pt will go Sit to Stand: with supervision PT Goal: Sit to Stand - Progress: Progressing toward goal Pt will go Stand to Sit: with supervision PT Goal: Stand to Sit - Progress: Progressing toward goal Pt will Ambulate: 51 - 150 feet;with supervision;with least restrictive assistive device PT Goal: Ambulate - Progress: Progressing toward goal  Visit Information  Last PT Received On: 09/27/11 Assistance Needed: +2    Subjective Data  Subjective: I can't get up right now Patient Stated Goal: to doing what I want to do when I want to do it.    Cognition  Overall Cognitive Status: Impaired Area of Impairment: Following commands;Safety/judgement Arousal/Alertness: Awake/alert Orientation Level: Appears intact for tasks assessed Behavior During Session: [facility]  for tasks performed Following Commands: Follows multi-step commands inconsistently Safety/Judgement: Decreased awareness of safety precautions;Decreased safety judgement for tasks  assessed;Impulsive;Decreased awareness of need for assistance    Balance     End of Session PT - End of Session Equipment Utilized During Treatment: Left knee immobilizer;Gait belt Activity Tolerance: Patient limited by pain Patient left: in chair;with call bell/phone within reach Nurse Communication: Mobility status   GP     Page, Meribeth Mattes 09/27/2011, 4:37 PM

## 2011-09-27 NOTE — Progress Notes (Signed)
Physical Therapy Treatment Patient Details Name: Joe [name] MRN: [id number] DOB: November 29, 1935 Today's Date: 09/27/2011 Time: 0906-0920 PT Time Calculation (min): 14 min  PT Assessment / Plan / Recommendation Comments on Treatment Session  Pt agreed to ambulate, however did not want to leave room due to pt stating that there is a family member here that he does not want to see.  Still has trouble getting to EOB and standing.  May need to consider STSNF for safety.     Follow Up Recommendations  Skilled nursing facility;Home health PT    Barriers to Discharge        Equipment Recommendations  None recommended by PT    Recommendations for Other Services    Frequency 7X/week   Plan Discharge plan remains appropriate    Precautions / Restrictions Precautions Precautions: Knee Required Braces or Orthoses: Knee Immobilizer - Left Knee Immobilizer - Left: Discontinue once straight leg raise with < 10 degree lag Restrictions Weight Bearing Restrictions: Yes LLE Weight Bearing: Partial weight bearing LLE Partial Weight Bearing Percentage or Pounds: 50%   Pertinent Vitals/Pain 7/10, did not want pain meds prior to session.     Mobility  Bed Mobility Bed Mobility: Supine to Sit Supine to Sit: 3: Mod assist Details for Bed Mobility Assistance: Did somewhat better with bed mobility today, however requires max cuing for hand placement to self assist.  Transfers Transfers: Sit to Stand;Stand to Sit Sit to Stand: 1: +2 Total assist;From elevated surface;With upper extremity assist;From bed Sit to Stand: Patient Percentage: 50% Stand to Sit: 1: +2 Total assist;With upper extremity assist;With armrests;To chair/3-in-1 Stand to Sit: Patient Percentage: 70% Details for Transfer Assistance: Continue to provide max cuing for hand placement when sitting/standing.  Some assist for LLE when sitting.  Ambulation/Gait Ambulation/Gait Assistance: 1: +2 Total assist Ambulation/Gait: Patient  Percentage: 70% Ambulation Distance (Feet): 20 Feet Assistive device: Rolling walker Ambulation/Gait Assistance Details: Max cues for sequencing/technique with RW and to maintain upright posture. Pt also c/o pain in R ankle.  Gait Pattern: Step-to pattern;Trunk flexed    Exercises     PT Diagnosis:    PT Problem List:   PT Treatment Interventions:     PT Goals Acute Rehab PT Goals PT Goal Formulation: With patient Time For Goal Achievement: 10/01/11 Potential to Achieve Goals: Fair Pt will go Supine/Side to Sit: with supervision PT Goal: Supine/Side to Sit - Progress: Progressing toward goal Pt will go Sit to Supine/Side: with supervision PT Goal: Sit to Supine/Side - Progress: Goal set today Pt will go Sit to Stand: with supervision PT Goal: Sit to Stand - Progress: Progressing toward goal Pt will go Stand to Sit: with supervision PT Goal: Stand to Sit - Progress: Progressing toward goal Pt will Ambulate: 51 - 150 feet;with supervision;with least restrictive assistive device PT Goal: Ambulate - Progress: Progressing toward goal Pt will Perform Home Exercise Program: with supervision, verbal cues required/provided PT Goal: Perform Home Exercise Program - Progress: Goal set today  Visit Information  Last PT Received On: 09/27/11 Assistance Needed: +2    Subjective Data  Subjective: I'm not walking out in the hallway Patient Stated Goal: to doing what I want to do when I want to do it.    Cognition  Overall Cognitive Status: Appears within functional limits for tasks assessed/performed Arousal/Alertness: Awake/alert Orientation Level: Appears intact for tasks assessed Behavior During Session: [facility] for tasks performed Cognition - Other Comments: Pt somewhat agitated, states that there is a family member  here that he doesn't want to see.     Balance     End of Session PT - End of Session Equipment Utilized During Treatment: Left knee immobilizer;Gait belt Activity  Tolerance: Patient limited by pain Patient left: in chair;with call bell/phone within reach Nurse Communication: Mobility status   GP     Page, Meribeth Mattes 09/27/2011, 10:00 AM

## 2011-09-27 NOTE — Progress Notes (Signed)
Occupational Therapy Treatment Patient Details Name: Joe [name] MRN: [id number] DOB: Jan 02, 1936 Today's Date: 09/27/2011 Time: 1478-2956 OT Time Calculation (min): 21 min  OT Assessment / Plan / Recommendation Comments on Treatment Session Discussed SNF option with pt/family member. Pt is currently very unsafe with ADL/functional mobility and family states she cant do all this herself. Pt, with encouragement, was agreeable to consider SNF (would like [facility]).     Follow Up Recommendations  Highly recommend Skilled nursing facility; If pt not agreeable, then Home health OT and an aide. Will need 24/7 assist if home.   Barriers to Discharge       Equipment Recommendations  None recommended by OT    Recommendations for Other Services    Frequency Min 2X/week   Plan Discharge plan needs to be updated    Precautions / Restrictions Precautions Precautions: Knee;Fall Required Braces or Orthoses: Knee Immobilizer - Left Knee Immobilizer - Left: Discontinue once straight leg raise with < 10 degree lag Restrictions Weight Bearing Restrictions: Yes LLE Weight Bearing: Partial weight bearing LLE Partial Weight Bearing Percentage or Pounds: 50%        ADL  Toilet Transfer: Simulated;+2 Total assistance Toilet Transfer: Patient Percentage: 70% Toilet Transfer Method: Other (comment) (to chair after transferring to bathroom to stand at toilet) Equipment Used: Rolling walker ADL Comments: Pt needs a lot of encouragement to participate and try to help himself gain independence. Pt displays decreased safety awareness as he tries to stand from EOB before fully turned around with feet on floor. He also uses RW unsafely at times. Pt stood in bathroom at toilet to urinate but was unsafe, reaching for wall and door frame and not holding to grab bars as directed until OT could assist with drying urine off floor. Advised pt/family that pt will be safer to sit on 3in1/toilet to urinate also.  Discussed  SNF option as pt currently not safe to discharge home.   OT Diagnosis:    OT Problem List:   OT Treatment Interventions:     OT Goals ADL Goals ADL Goal: Toilet Transfer - Progress: Progressing toward goals  Visit Information  Last OT Received On: 09/27/11 Assistance Needed: +2 PT/OT Co-Evaluation/Treatment: Yes    Subjective Data  Subjective: pt slow to wake up and acknowledge PT/OT Patient Stated Goal: with encouragement agreeable up to chair to eat   Prior Functioning       Cognition  Overall Cognitive Status: Impaired Area of Impairment: Following commands;Safety/judgement Arousal/Alertness: Awake/alert Orientation Level: Appears intact for tasks assessed Behavior During Session: [facility] for tasks performed Safety/Judgement: Decreased awareness of safety precautions;Decreased safety judgement for tasks assessed;Impulsive;Decreased awareness of need for assistance    Mobility  Shoulder Instructions Bed Mobility Bed Mobility: Supine to Sit Supine to Sit: 1: +2 Total assist;HOB elevated;With rails Supine to Sit: Patient Percentage: 50% Details for Bed Mobility Assistance: use of pad to scoot pt around to EOB, assist for trunk to upright and also assist for LEs to EOB.  Transfers Transfers: Sit to Stand;Stand to Sit Sit to Stand: 1: +2 Total assist;From bed;From elevated surface;With upper extremity assist Sit to Stand: Patient Percentage: 70% Stand to Sit: 1: +2 Total assist;To chair/3-in-1;With upper extremity assist Stand to Sit: Patient Percentage: 70% Details for Transfer Assistance: continous verbal cues for hand placement and LE management. Also needed cues for sequencing for RW use and overall safety.        Exercises      Balance     End  of Session OT - End of Session Activity Tolerance: Patient limited by pain Patient left: in chair;with call bell/phone within reach;with family/visitor present  GO     Lennox Laity [phone] 09/27/2011, 3:51  PM

## 2011-09-27 NOTE — Progress Notes (Addendum)
Subjective: Had a good night. No chills or sweats. Just walked with therapy in the room. Some knee pain Eating OK   Objective: Vital signs in last 24 hours: Temp:  [97.6 F (36.4 C)-99.2 F (37.3 C)] 99.2 F (37.3 C) (09/07 0500) Pulse Rate:  [57-74] 74  (09/07 0500) Resp:  [14-17] 16  (09/07 0800) BP: (110-157)/(56-79) 146/74 mmHg (09/07 0500) SpO2:  [98 %-100 %] 99 % (09/07 0800)  Intake/Output from previous day: 09/06 0701 - 09/07 0700 In: 2805.7 [P.O.:480; I.V.:1825.7; IV Piggyback:500] Out: 2775 [Urine:2775] Intake/Output this shift: Total I/O In: -  Out: 300 [Urine:300]  General: alert, sitting up in no distress. Face symmetric, neck supple. Lungs clear. Ht regular distant abd soft and NT, mentation is OK  Lab Results   Basename 09/27/11 0510 09/26/11 0406  WBC 7.8 8.4  RBC 3.34* 4.02*  HGB 9.0* 11.1*  HCT 27.6* 33.7*  MCV 82.6 83.8  MCH 26.9 27.6  RDW 15.8* 15.8*  PLT 273 196    Basename 09/27/11 0510 09/26/11 0406  NA 131* 130*  K 3.8 4.2  CL 97 96  CO2 23 20  GLUCOSE 101* 130*  BUN 18 20  CREATININE 1.26 1.13  CALCIUM 8.0* 8.6    Studies/Results: Dg Chest 2 View  09/27/2011  *RADIOLOGY REPORT*  Clinical Data: PICC line placement.  Postop from knee surgery.  CHEST - 2 VIEW  Comparison: 09/26/2011  Findings: A right arm PICC line is seen with tip in the mid portion of the SVC.  Cardiomegaly is stable.  Low lung volumes again seen, however both lungs are clear. Tiny old left posterior pleural effusion noted on lateral projection.  Prior median sternotomy again noted.  IMPRESSION:  1.  Right arm PICC line tip in mid SVC. 2.  Stable cardiomegaly.  No active lung disease. 3.  Tiny left posterior pleural effusion.   Original Report Authenticated By: Danae Orleans, M.D.    Dg Chest Port 1 View  09/26/2011  *RADIOLOGY REPORT*  Clinical Data: Confirm line placement  PORTABLE CHEST - 1 VIEW  Comparison: Chest radiograph 04/20/2011  Findings: The right upper  extremity PICC can be followed to the distal superior vena cava, but its distal tip is difficult to definitively visualize.  Stable cardiomegaly and postoperative changes of median sternotomy.  There is diffuse pulmonary vascular congestion without edema.  There is blunting of the right costophrenic angle for which a small right pleural effusion cannot be excluded.  Negative for pneumothorax.  IMPRESSION:  1.  Right upper extremity PICC can be followed to the distal superior vena cava, but its distal tip is difficult to definitively visualize.  Consider a follow-up PA and lateral chest radiograph in the radiology department for improved visualization. 2.  Cardiomegaly pulmonary vascular congestion. 3.  Question tiny right pleural effusion.   Original Report Authenticated By: Britta Mccreedy, M.D.    X-ray Knee Left Port  09/25/2011  *RADIOLOGY REPORT*  Clinical Data: Postop knee arthroplasty.  PORTABLE LEFT KNEE - 1-2 VIEW  Comparison: 04/15/2011  Findings: Knee arthroplasty is again seen in stable position. Chronic deformity the patella is again demonstrated. No acute fracture or dislocation.  There has been placed of numerous antibiotic beads surrounding the knee joint.  Surgical drain and overlying skin staples are noted.  IMPRESSION: Knee arthroplasty in expected position.  No acute bone abnormality. Numerous antibiotic beads surrounding the knee joint.   Original Report Authenticated By: Danae Orleans, M.D.     Scheduled Meds:   .  atorvastatin  40 mg Oral Daily  . ferrous sulfate  325 mg Oral TID PC  . isosorbide mononitrate  60 mg Oral Daily  . lisinopril  10 mg Oral Daily  . pantoprazole  40 mg Oral Daily  . rivaroxaban  10 mg Oral Q breakfast  . Tamsulosin HCl  0.4 mg Oral Daily  . vancomycin  1,500 mg Intravenous Q24H   Continuous Infusions:   . 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 WBC 7.8. Tmax 99.2 . antibiotics continue . question whether we need to get infectious disease involved for recommendations on duration of therapy but we'll leave this up to orthopedic surgery. Depending on initial culture may just need 6 weeks of IV vancomycin again with continued indefinite rifampin  #2 coronary artery disease with remote coronary artery bypass grafting currently stable no concerns  #3 essential hypertension stable  #4 hyperlipidemia stable  #5 Anemia: Hgb is down 2 grams since yesterday. Check again in AM #6 Hyponatremia: minor at 131, better than yesterday Plan antibiotics medically stable will follow    LOS: 4 days   Alleen Kehm ALAN 09/27/2011, 9:25 AM

## 2011-09-28 LAB — CBC
Hemoglobin: 9.3 g/dL — ABNORMAL LOW (ref 13.0–17.0)
Platelets: 362 10*3/uL (ref 150–400)
RBC: 3.46 MIL/uL — ABNORMAL LOW (ref 4.22–5.81)

## 2011-09-28 NOTE — Progress Notes (Signed)
Physical Therapy Treatment Patient Details Name: Joe [name] MRN: [id number] DOB: [birth year] Today's Date: 09/28/2011 Time: 1478-2956 PT Time Calculation (min): 26 min  PT Assessment / Plan / Recommendation Comments on Treatment Session  Pt continues to require +2 to stand from bed and demos unsafe behavior with mobility.  Continue to recommend SNF, however pt states that he cannot afford it.     Follow Up Recommendations  Home health PT;Skilled nursing facility    Barriers to Discharge        Equipment Recommendations  None recommended by OT;None recommended by PT    Recommendations for Other Services    Frequency 7X/week   Plan Discharge plan remains appropriate    Precautions / Restrictions Precautions Precautions: Knee;Fall Required Braces or Orthoses: Knee Immobilizer - Left Knee Immobilizer - Left: Discontinue once straight leg raise with < 10 degree lag Restrictions Weight Bearing Restrictions: Yes LLE Weight Bearing: Partial weight bearing LLE Partial Weight Bearing Percentage or Pounds: 50%   Pertinent Vitals/Pain 7/10, ice packs applied    Mobility  Bed Mobility Bed Mobility: Supine to Sit Supine to Sit: HOB flat;4: Min assist Details for Bed Mobility Assistance: Pt continues to require max cues for hand placement and technique.  Pt very slow to get EOB and also requires max cuing to not use hand rails to better simulate home environment.  Transfers Transfers: Sit to Stand;Stand to Sit Sit to Stand: 1: +2 Total assist;From bed;From elevated surface;With upper extremity assist Sit to Stand: Patient Percentage: 50% Stand to Sit: 1: +2 Total assist;To chair/3-in-1;With upper extremity assist Stand to Sit: Patient Percentage: 70% Details for Transfer Assistance: Pt continues to have increased difficulty with sit to stand from bed.  Pt states that bed is not high, however wife states bed is very high at home.  Max cuing for hand placement, technique and safety.    Ambulation/Gait Ambulation/Gait Assistance: 1: +2 Total assist Ambulation/Gait: Patient Percentage: 70% Ambulation Distance (Feet): 110 Feet Assistive device: Rolling walker Ambulation/Gait Assistance Details: Max cues for sequencing/technique and positioning inside of RW, however pt refuses to follow commands.  Gait Pattern: Step-to pattern;Trunk flexed    Exercises     PT Diagnosis:    PT Problem List:   PT Treatment Interventions:     PT Goals Acute Rehab PT Goals PT Goal Formulation: With patient Time For Goal Achievement: 10/01/11 Potential to Achieve Goals: Fair Pt will go Supine/Side to Sit: with supervision PT Goal: Supine/Side to Sit - Progress: Progressing toward goal Pt will go Sit to Stand: with supervision PT Goal: Sit to Stand - Progress: Progressing toward goal Pt will go Stand to Sit: with supervision PT Goal: Stand to Sit - Progress: Progressing toward goal Pt will Ambulate: 51 - 150 feet;with supervision;with least restrictive assistive device PT Goal: Ambulate - Progress: Progressing toward goal  Visit Information  Last PT Received On: 09/28/11 Assistance Needed: +2    Subjective Data  Subjective: I have no problems getting out of bed.  Patient Stated Goal: to doing what I want to do when I want to do it.    Cognition  Overall Cognitive Status: Impaired Area of Impairment: Safety/judgement Arousal/Alertness: Awake/alert Orientation Level: Appears intact for tasks assessed Behavior During Session: [facility] for tasks performed Following Commands: Follows multi-step commands inconsistently Safety/Judgement: Decreased awareness of safety precautions;Decreased safety judgement for tasks assessed;Impulsive;Decreased awareness of need for assistance    Balance     End of Session PT - End of Session Equipment Utilized  During Treatment: Left knee immobilizer;Gait belt Activity Tolerance: Patient limited by fatigue;Patient limited by pain Patient left: in  chair;with call bell/phone within reach Nurse Communication: Mobility status   GP     Page, Meribeth Mattes 09/28/2011, 10:31 AM

## 2011-09-28 NOTE — Progress Notes (Signed)
   Subjective: 3 Days Post-Op Procedure(s) (LRB): IRRIGATION AND DEBRIDEMENT KNEE WITH POLY EXCHANGE (Left)   Patient reports pain as mild, pain well controlled. No events throughout the night.  Objective:   VITALS:   Filed Vitals:   09/28/11   BP: 123/68  Pulse: 59  Temp: 97.8 F (36.6 C)   Resp: 18    Neurovascular intact Dorsiflexion/Plantar flexion intact Incision: dressing C/D/I No cellulitis present Compartment soft  LABS  Basename 09/28/11 0510 09/27/11 0510 09/26/11 0406  HGB 9.3* 9.0* 11.1*  HCT 28.7* 27.6* 33.7*  WBC 9.3 7.8 8.4  PLT 362 273 196     Basename 09/27/11 0510 09/26/11 0406  NA 131* 130*  K 3.8 4.2  BUN 18 20  CREATININE 1.26 1.13  GLUCOSE 101* 130*     Assessment/Plan: 3 Days Post-Op Procedure(s) (LRB): IRRIGATION AND DEBRIDEMENT KNEE WITH POLY EXCHANGE (Left)   Continue Iv antibiotics Up with therapy Plan for discharge tomorrow if continues to do well    Anastasio Auerbach. Raivyn Kabler   PAC  09/28/2011, 8:30 AM

## 2011-09-28 NOTE — Progress Notes (Signed)
Occupational Therapy Treatment Patient Details Name: Joe [name] MRN: [id number] DOB: Jun 13, 1935 Today's Date: 09/28/2011 Time: 4098-1191 OT Time Calculation (min): 22 min  OT Assessment / Plan / Recommendation Comments on Treatment Session Pt adamantly refusing snf at this time. Pt continues to demo very unsafe behaviors with ADLs and functional mobility. Feel snf would be safest option. Pt presents as a high fall risk.    Follow Up Recommendations  Skilled nursing facility;Home health OT;Supervision/Assistance - 24 hour    Barriers to Discharge       Equipment Recommendations  None recommended by OT    Recommendations for Other Services    Frequency Min 2X/week   Plan Discharge plan remains appropriate    Precautions / Restrictions Precautions Precautions: Knee;Fall Required Braces or Orthoses: Knee Immobilizer - Left Knee Immobilizer - Left: Discontinue once straight leg raise with < 10 degree lag Restrictions Weight Bearing Restrictions: Yes LLE Weight Bearing: Partial weight bearing LLE Partial Weight Bearing Percentage or Pounds: 50%   Pertinent Vitals/Pain Pt reported 6/10 pain with activity. Repositioned for comfort.    ADL  Toilet Transfer: Performed;+2 Total assistance Toilet Transfer: Patient Percentage: 70% Toilet Transfer Method: Sit to stand Toilet Transfer Equipment: Raised toilet seat with arms (or 3-in-1 over toilet) Toileting - Clothing Manipulation and Hygiene: Performed;Maximal assistance Where Assessed - Toileting Clothing Manipulation and Hygiene: Sit to stand from 3-in-1 or toilet ADL Comments: Pt continues to demo POOR safety awareness. Again attempted to urinate standing. Advised pt to sit on commode.Pt stated it was too "uncomfortable." Pt also attempts to abandon RW before reaching destination, grabbing onto the sink to steady self. MAX cues needed for safety.    OT Diagnosis:    OT Problem List:   OT Treatment Interventions:     OT Goals ADL  Goals ADL Goal: Toilet Transfer - Progress: Not progressing ADL Goal: Toileting - Clothing Manipulation - Progress: Not progressing ADL Goal: Toileting - Hygiene - Progress: Not progressing  Visit Information  Last OT Received On: 09/28/11 Assistance Needed: +2 PT/OT Co-Evaluation/Treatment: Yes    Subjective Data  Subjective: I am NOT going to a nursing home!   Prior Functioning       Cognition  Overall Cognitive Status: Impaired Area of Impairment: Safety/judgement;Awareness of deficits Arousal/Alertness: Awake/alert Orientation Level: Appears intact for tasks assessed Behavior During Session: [facility] for tasks performed Following Commands: Follows multi-step commands inconsistently Safety/Judgement: Decreased awareness of safety precautions;Decreased safety judgement for tasks assessed;Decreased awareness of need for assistance Cognition - Other Comments: Pt told therapist that he had a hospital bed and overhead trapeze. This is not true per wife.    Mobility  Shoulder Instructions Bed Mobility Bed Mobility: Supine to Sit Supine to Sit: HOB flat;4: Min assist Details for Bed Mobility Assistance: Pt continues to require max cues for hand placement and technique.  Pt very slow to get EOB and also requires max cuing to not use hand rails to better simulate home environment.  Transfers Sit to Stand: 1: +2 Total assist;From bed;From elevated surface;With upper extremity assist Sit to Stand: Patient Percentage: 50% Stand to Sit: 1: +2 Total assist;To chair/3-in-1;With upper extremity assist Stand to Sit: Patient Percentage: 70% Details for Transfer Assistance: Pt continues to have increased difficulty with sit to stand from bed.  Pt states that bed is not high, however wife states bed is very high at home.  Max cuing for hand placement, technique and safety.        Exercises  Balance     End of Session OT - End of Session Equipment Utilized During Treatment: Gait  belt Activity Tolerance: Patient tolerated treatment well Patient left: in chair;with call bell/phone within reach  GO     Shunda Rabadi A OTR/L [phone] 09/28/2011, 10:46 AM

## 2011-09-28 NOTE — Progress Notes (Signed)
Subjective: Didn't sleep much. Some knee pain. Eating OK. Bowels OK. No CP or SOB.   Objective: Vital signs in last 24 hours: Temp:  [97.8 F (36.6 C)-99.4 F (37.4 C)] 97.8 F (36.6 C) (09/08 0643) Pulse Rate:  [59-91] 59  (09/08 0643) Resp:  [16-18] 18  (09/08 0800) BP: (123-178)/(68-77) 123/68 mmHg (09/08 0643) SpO2:  [93 %-100 %] 98 % (09/08 0800)  Intake/Output from previous day: 09/07 0701 - 09/08 0700 In: 500 [P.O.:480; I.V.:20] Out: 2050 [Urine:2050] Intake/Output this shift: Total I/O In: 360 [P.O.:360] Out: 350 [Urine:350]  General: alert, lying flat, no dyspnea, mentating well  Lab Results   [facility] 09/28/11 0510 09/27/11 0510  WBC 9.3 7.8  RBC 3.46* 3.34*  HGB 9.3* 9.0*  HCT 28.7* 27.6*  MCV 82.9 82.6  MCH 26.9 26.9  RDW 15.8* 15.8*  PLT 362 273    Basename 09/27/11 0510 09/26/11 0406  NA 131* 130*  K 3.8 4.2  CL 97 96  CO2 23 20  GLUCOSE 101* 130*  BUN 18 20  CREATININE 1.26 1.13  CALCIUM 8.0* 8.6    Studies/Results: Dg Chest 2 View  09/27/2011  *RADIOLOGY REPORT*  Clinical Data: PICC line placement.  Postop from knee surgery.  CHEST - 2 VIEW  Comparison: 09/26/2011  Findings: A right arm PICC line is seen with tip in the mid portion of the SVC.  Cardiomegaly is stable.  Low lung volumes again seen, however both lungs are clear. Tiny old left posterior pleural effusion noted on lateral projection.  Prior median sternotomy again noted.  IMPRESSION:  1.  Right arm PICC line tip in mid SVC. 2.  Stable cardiomegaly.  No active lung disease. 3.  Tiny left posterior pleural effusion.   Original Report Authenticated By: Danae Orleans, M.D.    Dg Chest Port 1 View  09/26/2011  *RADIOLOGY REPORT*  Clinical Data: Confirm line placement  PORTABLE CHEST - 1 VIEW  Comparison: Chest radiograph 04/20/2011  Findings: The right upper extremity PICC can be followed to the distal superior vena cava, but its distal tip is difficult to definitively visualize.  Stable  cardiomegaly and postoperative changes of median sternotomy.  There is diffuse pulmonary vascular congestion without edema.  There is blunting of the right costophrenic angle for which a small right pleural effusion cannot be excluded.  Negative for pneumothorax.  IMPRESSION:  1.  Right upper extremity PICC can be followed to the distal superior vena cava, but its distal tip is difficult to definitively visualize.  Consider a follow-up PA and lateral chest radiograph in the radiology department for improved visualization. 2.  Cardiomegaly pulmonary vascular congestion. 3.  Question tiny right pleural effusion.   Original Report Authenticated By: Britta Mccreedy, M.D.     Scheduled Meds:   . atorvastatin  40 mg Oral Daily  . ferrous sulfate  325 mg Oral TID PC  . isosorbide mononitrate  60 mg Oral Daily  . lisinopril  10 mg Oral Daily  . pantoprazole  40 mg Oral Daily  . rivaroxaban  10 mg Oral Q breakfast  . Tamsulosin HCl  0.4 mg Oral Daily  . vancomycin  1,000 mg Intravenous Q24H  . DISCONTD: vancomycin  1,500 mg Intravenous Q24H   Continuous Infusions:   . 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   #2 coronary artery disease with remote coronary artery bypass grafting currently stable no concerns  #3 essential hypertension stable  #4 hyperlipidemia stable  #5 Anemia: Hgb is down 2 grams since yesterday. Stable at 9.3. Check once again in AM #6 Hyponatremia: minor at 131,   Plan antibiotics medically stable will follow     LOS: 5 days   Calista Crain  ALAN 09/28/2011, 9:34 AM

## 2011-09-28 NOTE — Progress Notes (Signed)
PT note:  Pt declined OOB or bed exercises for pm session and states "I'm cooling down for the day."  Thanks,  Clovia Cuff, PT

## 2011-09-29 DIAGNOSIS — D62 Acute posthemorrhagic anemia: Secondary | ICD-10-CM | POA: Diagnosis not present

## 2011-09-29 LAB — CBC
MCV: 82.4 fL (ref 78.0–100.0)
Platelets: 380 10*3/uL (ref 150–400)
RBC: 3.24 MIL/uL — ABNORMAL LOW (ref 4.22–5.81)
WBC: 9.2 10*3/uL (ref 4.0–10.5)

## 2011-09-29 LAB — BASIC METABOLIC PANEL
CO2: 25 mEq/L (ref 19–32)
Calcium: 8.7 mg/dL (ref 8.4–10.5)
Chloride: 100 mEq/L (ref 96–112)
Sodium: 133 mEq/L — ABNORMAL LOW (ref 135–145)

## 2011-09-29 MED ORDER — OXYCODONE-ACETAMINOPHEN 5-325 MG PO TABS: 2.0000 | ORAL_TABLET | ORAL | Status: AC | PRN

## 2011-09-29 MED ORDER — HEPARIN SOD (PORK) LOCK FLUSH 100 UNIT/ML IV SOLN
250.0000 [IU] | INTRAVENOUS | Status: DC | PRN
  Administered 2011-09-29: 250 [IU]
  Filled 2011-09-29: qty 3

## 2011-09-29 MED ORDER — RIVAROXABAN 10 MG PO TABS: 10.0000 mg | ORAL_TABLET | Freq: Every day | ORAL | Status: DC

## 2011-09-29 MED ORDER — METHOCARBAMOL 500 MG PO TABS: 500.0000 mg | ORAL_TABLET | Freq: Four times a day (QID) | ORAL | Status: AC | PRN

## 2011-09-29 MED ORDER — VANCOMYCIN HCL IN DEXTROSE 1-5 GM/200ML-% IV SOLN: 1000.0000 mg | INTRAVENOUS | Status: DC

## 2011-09-29 MED ORDER — HEPARIN SOD (PORK) LOCK FLUSH 100 UNIT/ML IV SOLN
250.0000 [IU] | Freq: Every day | INTRAVENOUS | Status: DC
  Filled 2011-09-29: qty 3

## 2011-09-29 MED ORDER — FERROUS SULFATE 325 (65 FE) MG PO TABS: 325.0000 mg | ORAL_TABLET | Freq: Three times a day (TID) | ORAL | Status: DC

## 2011-09-29 NOTE — Discharge Summary (Signed)
Physician Discharge Summary   Patient ID: Joe [name] MRN: [id number] DOB/AGE: [birth year] 76 y.o.  Admit date: 09/23/2011 Discharge date: 09/29/2011  Primary Diagnosis:  Infection of left total knee replacment Admission Diagnoses:  Past Medical History  Diagnosis Date  . Hypertension   . Hypercholesteremia   . Anxiety   . Obesity (BMI 35.0-39.9 without comorbidity)    Discharge Diagnoses:   Active Problems:  Infection of total knee replacement S/P I&D left total knee with poly exchange  Procedure:  Procedure(s) (LRB): IRRIGATION AND DEBRIDEMENT KNEE WITH POLY EXCHANGE (Left)   Consults: ID and endicrinology  HPI: Joe [name] presented with the chief complaint of left knee pain. He reports that on Friday of week before last he started having trouble with fever and chills. He reports that over that weekend he was feeling terrible. He also reports that the left total knee has been swollen and red. He denies any injury to the knee. He reports a history of a pin tract infection, with subsequent I&D by Dr. Thomasena Edis in April. Since then he has been on antibiotics for the infection. He reports feeling well until this past week. He denies nausea and vomiting and no bowel troubles. He denies recent infections, bladder, sinus, etc. He presented to the office, where 60cc of frank pus was aspirated from the left knee joint.    Laboratory Data: Admission on 04/15/2011, Discharged on 04/28/2011  No results displayed because visit has over 200 results.      Basename 09/29/11 0640 09/28/11 0510 09/27/11 0510  HGB 8.8* 9.3* 9.0*    Basename 09/29/11 0640 09/28/11 0510  WBC 9.2 9.3  RBC 3.24* 3.46*  HCT 26.7* 28.7*  PLT 380 362    Basename 09/29/11 0640 09/27/11 0510  NA 133* 131*  K 3.8 3.8  CL 100 97  CO2 25 23  BUN 19 18  CREATININE 1.41* 1.26  GLUCOSE 97 101*  CALCIUM 8.7 8.0*    X-Rays:Dg Chest 2 View  09/27/2011  *RADIOLOGY REPORT*  Clinical Data: PICC line placement.  Postop  from knee surgery.  CHEST - 2 VIEW  Comparison: 09/26/2011  Findings: A right arm PICC line is seen with tip in the mid portion of the SVC.  Cardiomegaly is stable.  Low lung volumes again seen, however both lungs are clear. Tiny old left posterior pleural effusion noted on lateral projection.  Prior median sternotomy again noted.  IMPRESSION:  1.  Right arm PICC line tip in mid SVC. 2.  Stable cardiomegaly.  No active lung disease. 3.  Tiny left posterior pleural effusion.   Original Report Authenticated By: Danae Orleans, M.D.    Dg Chest Port 1 View  09/26/2011  *RADIOLOGY REPORT*  Clinical Data: Confirm line placement  PORTABLE CHEST - 1 VIEW  Comparison: Chest radiograph 04/20/2011  Findings: The right upper extremity PICC can be followed to the distal superior vena cava, but its distal tip is difficult to definitively visualize.  Stable cardiomegaly and postoperative changes of median sternotomy.  There is diffuse pulmonary vascular congestion without edema.  There is blunting of the right costophrenic angle for which a small right pleural effusion cannot be excluded.  Negative for pneumothorax.  IMPRESSION:  1.  Right upper extremity PICC can be followed to the distal superior vena cava, but its distal tip is difficult to definitively visualize.  Consider a follow-up PA and lateral chest radiograph in the radiology department for improved visualization. 2.  Cardiomegaly pulmonary vascular congestion. 3.  Question  tiny right pleural effusion.   Original Report Authenticated By: Britta Mccreedy, M.D.    X-ray Knee Left Port  09/25/2011  *RADIOLOGY REPORT*  Clinical Data: Postop knee arthroplasty.  PORTABLE LEFT KNEE - 1-2 VIEW  Comparison: 04/15/2011  Findings: Knee arthroplasty is again seen in stable position. Chronic deformity the patella is again demonstrated. No acute fracture or dislocation.  There has been placed of numerous antibiotic beads surrounding the knee joint.  Surgical drain and overlying skin  staples are noted.  IMPRESSION: Knee arthroplasty in expected position.  No acute bone abnormality. Numerous antibiotic beads surrounding the knee joint.   Original Report Authenticated By: Danae Orleans, M.D.     EKG: Orders placed during the hospital encounter of 04/15/11  . EKG     Hospital Course: Patient was admitted to [facility]. He was started on IV Vancomycin while preoperative testing commenced. Two days after admission, he was taken to the OR and underwent the above state procedure without complications.  Patient tolerated the procedure well and was later transferred to the recovery room and then to the orthopaedic floor for postoperative care.  They were given PO and IV analgesics for pain control following their surgery.  They were started on DVT prophylaxis in the form of Xarelto. He was continued on IV antibiotics per pharmacy protocol due to the infection in the left knee.  PT and OT were ordered for total joint protocol.  Discharge planning consulted to help with postop disposition and equipment needs. Patient has planned and wanted to go home after hospital stay.  Patient had a decent night on the evening of surgery and started to get up OOB with therapy on day one post op.  Hemovac drain was pulled without difficulty on day two.  Continued to work with therapy into day two but required 2+ assistance. PT and family recommended SNF placement. Dressing was changed on day two and the incision was clean and dry.  Patient developed acute blood loss anemia but Hgb holding steady. By day three, the patient had decided to go to SNF .  Incision was healing well.  Patient was seen in rounds and was ready to go to SNF on IV Vancomycin per recommendation of ID.  Discharge Medications: Prior to Admission medications   Medication Sig Start Date End Date Taking? Authorizing Provider  ALPRAZolam (XANAX) 0.25 MG tablet Take 0.25 mg by mouth at bedtime as needed. insomnia   Yes Historical  Provider, MD  atorvastatin (LIPITOR) 40 MG tablet Take 40 mg by mouth daily.   Yes Historical Provider, MD  isosorbide mononitrate (IMDUR) 60 MG 24 hr tablet Take 60 mg by mouth daily.   Yes Historical Provider, MD  lisinopril (PRINIVIL,ZESTRIL) 10 MG tablet Take 10 mg by mouth daily.   Yes Historical Provider, MD  Tamsulosin HCl (FLOMAX) 0.4 MG CAPS Take 0.4 mg by mouth daily.   Yes Historical Provider, MD  ferrous sulfate 325 (65 FE) MG tablet Take 1 tablet (325 mg total) by mouth 3 (three) times daily after meals. 09/29/11 09/28/12  Eliga Arvie Tamala Ser, PA  methocarbamol (ROBAXIN) 500 MG tablet Take 1 tablet (500 mg total) by mouth every 6 (six) hours as needed. 09/29/11 10/09/11  Mattisen Pohlmann Tamala Ser, PA  oxyCODONE-acetaminophen (PERCOCET/ROXICET) 5-325 MG per tablet Take 2 tablets by mouth every 4 (four) hours as needed (Q4-6 hours PRN). 09/29/11 10/09/11  Melissaann Dizdarevic Tamala Ser, PA  rivaroxaban (XARELTO) 10 MG TABS tablet Take 1 tablet (10 mg total) by  mouth daily with breakfast. 09/29/11   Perrie Ragin Tamala Ser, PA  vancomycin (VANCOCIN) 1 GM/200ML SOLN Inject 200 mLs (1,000 mg total) into the vein daily. 09/29/11   Maesyn Frisinger Tamala Ser, PA    Diet: Cardiac diet Activity:WBAT Follow-up:in 2 weeks Disposition - Skilled nursing facility Discharged Condition: fair   Discharge Orders    Future Appointments: Provider: Department: Dept Phone: Center:   10/20/2011 8:45 AM Sherrie George, MD [facility] [phone] None   10/23/2011 11:15 AM Ginnie Smart, MD [facility] [phone] [facility]     Future Orders Please Complete By Expires   Diet - low sodium heart healthy      Continue PICC at discharge      Questions: Responses:   Flush PICC line per home health policy Yes   Call MD / Call 911      Comments:   If you experience chest pain or shortness of breath, CALL 911 and be transported to the hospital emergency room.  If you develope a fever above 101 F, pus (white  drainage) or increased drainage or redness at the wound, or calf pain, call your surgeon's office.   Constipation Prevention      Comments:   Drink plenty of fluids.  Prune juice may be helpful.  You may use a stool softener, such as Colace (over the counter) 100 mg twice a day.  Use MiraLax (over the counter) for constipation as needed.   Increase activity slowly as tolerated      Discharge instructions      Comments:   .Walk with your walker. Weight bearing as instructed. Home Health Agency will follow you at home for your therapy and IV Vancomycin Change your dressing daily. Shower only, no tub bath. Call if any temperatures greater than 101 or any wound complications: [phone] during the day and ask for Dr. Jeannetta Ellis nurse, [facility] [place].   Patient may shower      Comments:   You may shower without a dressing. Do not wash over the wound.  If drainage remains, cover wound with plastic wrap and then shower.   Driving restrictions      Comments:   No driving   Do not put a pillow under the knee. Place it under the heel.      Change dressing      Comments:   Change dressing daily with sterile 4 x 4 inch gauze dressing     Medication List  As of 09/29/2011  7:58 AM   STOP taking these medications         aspirin 81 MG chewable tablet      meloxicam 15 MG tablet      pantoprazole 40 MG tablet      rifampin 300 MG capsule      traMADol 50 MG tablet      zolpidem 5 MG tablet         TAKE these medications         ALPRAZolam 0.25 MG tablet   Commonly known as: XANAX   Take 0.25 mg by mouth at bedtime as needed. insomnia      atorvastatin 40 MG tablet   Commonly known as: LIPITOR   Take 40 mg by mouth daily.      ferrous sulfate 325 (65 FE) MG tablet   Take 1 tablet (325 mg total) by mouth 3 (three) times daily after meals.      isosorbide mononitrate 60 MG 24 hr tablet  Commonly known as: IMDUR   Take 60 mg by mouth daily.      lisinopril 10 MG tablet   Commonly  known as: PRINIVIL,ZESTRIL   Take 10 mg by mouth daily.      methocarbamol 500 MG tablet   Commonly known as: ROBAXIN   Take 1 tablet (500 mg total) by mouth every 6 (six) hours as needed.      oxyCODONE-acetaminophen 5-325 MG per tablet   Commonly known as: PERCOCET/ROXICET   Take 2 tablets by mouth every 4 (four) hours as needed (Q4-6 hours PRN).      rivaroxaban 10 MG Tabs tablet   Commonly known as: XARELTO   Take 1 tablet (10 mg total) by mouth daily with breakfast.      Tamsulosin HCl 0.4 MG Caps   Commonly known as: FLOMAX   Take 0.4 mg by mouth daily.      vancomycin 1 GM/200ML Soln   Commonly known as: VANCOCIN   Inject 200 mLs (1,000 mg total) into the vein daily.             Signed: Ainhoa Rallo LAUREN 09/29/2011, 7:58 AM

## 2011-09-29 NOTE — Progress Notes (Signed)
ANTIBIOTIC CONSULT NOTE   Pharmacy Consult for Vancomycin Indication: Infected L TKA  Allergies  Allergen Reactions  . Clarithromycin Rash    Patient Measurements: Height: 5\' 6"  (167.6 cm) Weight: 240 lb (108.863 kg) IBW/kg (Calculated) : 63.8  Adjusted Body Weight: 81.6kg  Vital Signs: Temp: 99 F (37.2 C) (09/09 0626) Temp src: Oral (09/09 0626) BP: 155/61 mmHg (09/09 0626) Pulse Rate: 66  (09/09 0626) Intake/Output from previous day: 09/08 0701 - 09/09 0700 In: 1000 [P.O.:600; IV Piggyback:400] Out: 1950 [Urine:1950] Intake/Output from this shift: Total I/O In: -  Out: 200 [Urine:200]  Labs:  [facility] 09/29/11 0640 09/28/11 0510 09/27/11 0510  WBC 9.2 9.3 7.8  HGB 8.8* 9.3* 9.0*  PLT 380 362 273  LABCREA -- -- --  CREATININE 1.41* -- 1.26   Estimated Creatinine Clearance: 53.2 ml/min (by C-G formula based on Cr of 1.41).  49 ml/min/1.29m2 (normalized)   Microbiology: Recent Results (from the past 720 hour(s))  MRSA PCR SCREENING     Status: Normal   Collection Time   09/25/11 11:00 AM      Component Value Range Status Comment   MRSA by PCR NEGATIVE  NEGATIVE Final   WOUND CULTURE     Status: Normal   Collection Time   09/25/11  2:28 PM      Component Value Range Status Comment   Specimen Description WOUND LEFT KNEE JOINT   Final    Special Requests NONE   Final    Gram Stain     Final    Value: RARE WBC PRESENT, PREDOMINANTLY PMN     NO SQUAMOUS EPITHELIAL CELLS SEEN     NO ORGANISMS SEEN   Culture FEW STREPTOCOCCUS GROUP G   Final    Report Status 09/27/2011 FINAL   Final   ANAEROBIC CULTURE     Status: Normal (Preliminary result)   Collection Time   09/25/11  2:28 PM      Component Value Range Status Comment   Specimen Description WOUND LEFT KNEE JOINT   Final    Special Requests NONE   Final    Gram Stain     Final    Value: RARE WBC PRESENT, PREDOMINANTLY PMN     NO SQUAMOUS EPITHELIAL CELLS SEEN     NO ORGANISMS SEEN   Culture     Final    Value: NO ANAEROBES ISOLATED; CULTURE IN PROGRESS FOR 5 DAYS   Report Status PENDING   Incomplete     Urine culture ordered  Medical History: Past Medical History  Diagnosis Date  . Hypertension   . Hypercholesteremia   . Anxiety   . Obesity (BMI 35.0-39.9 without comorbidity)     Medications:  Scheduled:     . atorvastatin  40 mg Oral Daily  . ferrous sulfate  325 mg Oral TID PC  . isosorbide mononitrate  60 mg Oral Daily  . lisinopril  10 mg Oral Daily  . pantoprazole  40 mg Oral Daily  . rivaroxaban  10 mg Oral Q breakfast  . Tamsulosin HCl  0.4 mg Oral Daily  . vancomycin  1,000 mg Intravenous Q24H   Infusions:     . lactated ringers 20 mL/hr at 09/29/11 1610   Assessment:  75 y/o M with persistent L prosthetic knee infection, now on D#7 Vancomycin 1000mg  IV q24h (dosage reduced 09/27/11 based on trough) .   Planning 6 weeks of postoperative antibiotic (underwent debridement and synovectomy with placement of antibiotic beads 09/26/11).  SCr rising despite Vancomycin dosage reduction.  Next Vancomycin dosage currently scheduled for 1800 tonight.    Goal of Therapy:  Vancomycin trough level 15-20 mcg/ml  Recommend:   Delay next Vancomycin dose until 10am tomorrow (09/30/11).  Check Vancomycin level (will not be steady-state) and serum creatinine tomorrow at 9am Reassess appropriateness of current Vancomycin dosage based upon Vancomycin level and serum creatinine tomorrow. Check Vancomycin trough and serum creatinine at least twice a week during the 6 week course of Vancomycin; adjust dosage to maintain trough 15-20.  Hope Budds, PharmD, BCPS Pager: [phone] 09/29/2011 9:49 AM

## 2011-09-29 NOTE — Progress Notes (Addendum)
Occupational Therapy Treatment Patient Details Name: Joe [name] MRN: [id number] DOB: 1935-06-27 Today's Date: 09/29/2011 Time: 8416-6063 OT Time Calculation (min): 22 min  OT Assessment / Plan / Recommendation Comments on Treatment Session Pt is continuing to demo unsafe behaviors and is telling therapists that he plans on driving himself home. Pt still presents as a high fall risk during functional ADL tasks, refusing to follow safety cues. Recommend SNF at d/c. If pt continues to refuse, recommend HHOT with 24/7 supervision.    Follow Up Recommendations  Skilled nursing facility;Home health OT;Supervision/Assistance - 24 hour    Barriers to Discharge       Equipment Recommendations  None recommended by OT    Recommendations for Other Services    Frequency Min 2X/week   Plan Discharge plan remains appropriate    Precautions / Restrictions Precautions Precautions: Knee;Fall Restrictions Weight Bearing Restrictions: Yes LLE Weight Bearing: Partial weight bearing LLE Partial Weight Bearing Percentage or Pounds: 50%   Pertinent Vitals/Pain Reported 3/10 knee pain. Repositioned for comfort.    ADL  Tub/Shower Transfer: Performed;Moderate assistance Tub/Shower Transfer Method: Science writer: Walk in shower Equipment Used: Gait belt;Rolling walker ADL Comments: Attepmpted to educate pt how to safely step into shower using backwards method. Pt refused education attemting to walk in forwards and stumbling and losing his balance. Had to stabilize self with grab bars. Pt remained argumentative when therapist gave him suggestions on how to complete the task safely.     OT Diagnosis:    OT Problem List:   OT Treatment Interventions:     OT Goals ADL Goals ADL Goal: Toilet Transfer - Progress: Progressing toward goals ADL Goal: Tub/Shower Transfer - Progress: Not progressing  Visit Information  Last OT Received On: 09/29/11 Assistance Needed: +2 (for  bathroom safety) PT/OT Co-Evaluation/Treatment: Yes    Subjective Data  Subjective: I'm planning on driving myself home today.   Prior Functioning       Cognition  Overall Cognitive Status: Impaired Area of Impairment: Safety/judgement;Awareness of errors;Awareness of deficits Arousal/Alertness: Awake/alert Orientation Level: Appears intact for tasks assessed Behavior During Session: [facility] for tasks performed Following Commands: Follows multi-step commands inconsistently Safety/Judgement: Decreased awareness of safety precautions;Decreased safety judgement for tasks assessed;Impulsive;Decreased awareness of need for assistance Awareness of Errors: Assistance required to identify errors made;Assistance required to correct errors made    Mobility  Shoulder Instructions Bed Mobility Bed Mobility: Supine to Sit Supine to Sit: 4: Min assist;HOB flat Sitting - Scoot to Edge of Bed: 5: Supervision Details for Bed Mobility Assistance: Cues not to use rails or to grab onto furniture to simulate home environment. Transfers Sit to Stand: 4: Min assist;With upper extremity assist;From bed Stand to Sit: 4: Min assist;With upper extremity assist;With armrests;To chair/3-in-1 Details for Transfer Assistance: Pt continues to demo unsafe behaviors during mobility, max cues for hand placement and safety.       Exercises      Balance     End of Session OT - End of Session Equipment Utilized During Treatment: Gait belt Activity Tolerance: Patient tolerated treatment well Patient left: in chair;with call bell/phone within reach  GO     Rilla Buckman A OTR/L [phone] 09/29/2011, 9:04 AM

## 2011-09-29 NOTE — Progress Notes (Signed)
Clinical Social Work Department BRIEF PSYCHOSOCIAL ASSESSMENT 09/29/2011  Patient:  CAMARON, CAMMACK     Account Number:  [id number]     Admit date:  09/23/2011  Clinical Social Worker:  Candie Chroman  Date/Time:  09/29/2011 04:52 PM  Referred by:  Physician  Date Referred:  09/29/2011 Referred for  SNF Placement   Other Referral:   Interview type:  Patient Other interview type:    PSYCHOSOCIAL DATA Living Status:  WIFE Admitted from facility:   Level of care:   Primary support name:   Primary support relationship to patient:  SPOUSE Degree of support available:   unclear    CURRENT CONCERNS Current Concerns  Post-Acute Placement   Other Concerns:    SOCIAL WORK ASSESSMENT / PLAN Pt is a 76 yr old gentleman living at home prior to hospitalization. CSW met with pt to assist with d/c planning. ST SNF placement is needed . Pt has requested rehab at [facility] [place]. Pt is ready for d/c today. [facility] is able offer pt a ST rehab bed today, which pt has accepted. CSW has made arrangements for pt to be transported to [facility] via [facility] transport today.   Assessment/plan status:  No Further Intervention Required Other assessment/ plan:   Information/referral to community resources:   None needed at this time.    PATIENT'S/FAMILY'S RESPONSE TO PLAN OF CARE: Pt accepted bed at [facility] for ST rehab. Pt agreeable with [facility] transport.    Cori Razor LCSW [phone]

## 2011-09-29 NOTE — Progress Notes (Addendum)
Physical Therapy Treatment Patient Details Name: Joe [name] MRN: [id number] DOB: 04/19/1935 Today's Date: 09/29/2011 Time: 2952-8413 PT Time Calculation (min): 25 min  PT Assessment / Plan / Recommendation Comments on Treatment Session  shower transfer with OT and also for instruction in up/down one step to enter home; pt states he is driving himself home; RN notified; If D/C's home pt will need 24hr supervision/assist    Follow Up Recommendations  Skilled nursing facility    Barriers to Discharge        Equipment Recommendations       Recommendations for Other Services    Frequency 7X/week   Plan Discharge plan remains appropriate    Precautions / Restrictions Precautions Precautions: Knee;Fall Restrictions Weight Bearing Restrictions: Yes LLE Weight Bearing: Partial weight bearing LLE Partial Weight Bearing Percentage or Pounds: 50%   Pertinent Vitals/Pain     Mobility  Bed Mobility Bed Mobility: Supine to Sit Supine to Sit: 4: Min assist;HOB flat Sitting - Scoot to Edge of Bed: 5: Supervision Details for Bed Mobility Assistance: Cues not to use rails or to grab onto furniture to simulate home environment. Transfers Transfers: Sit to Stand;Stand to Sit Sit to Stand: 4: Min assist;With upper extremity assist;From bed Stand to Sit: 4: Min assist;With upper extremity assist;With armrests;To chair/3-in-1 Details for Transfer Assistance: Pt continues to demo unsafe behaviors during mobility, max cues for hand placement and safety. Ambulation/Gait Ambulation/Gait Assistance: 4: Min guard Ambulation Distance (Feet): 160 Feet Assistive device: Rolling walker Ambulation/Gait Assistance Details: Max cues for sequencing/technique and positioning inside of RW, however pt refuses to follow commands.  Gait Pattern: Step-to pattern;Trunk flexed    Exercises     PT Diagnosis:    PT Problem List:   PT Treatment Interventions:     PT Goals Acute Rehab PT Goals Time For Goal  Achievement: 10/01/11 Potential to Achieve Goals: Fair Pt will go Supine/Side to Sit: with supervision PT Goal: Supine/Side to Sit - Progress: Progressing toward goal Pt will go Sit to Stand: with supervision PT Goal: Sit to Stand - Progress: Progressing toward goal Pt will go Stand to Sit: with supervision PT Goal: Stand to Sit - Progress: Progressing toward goal Pt will Ambulate: 51 - 150 feet;with supervision;with least restrictive assistive device PT Goal: Ambulate - Progress: Progressing toward goal  Visit Information  Last PT Received On: 09/29/11 Assistance Needed: +2 (for bathroom safety)    Subjective Data  Patient Stated Goal: to doing what I want to do when I want to do it.    Cognition  Overall Cognitive Status: Impaired Area of Impairment: Safety/judgement;Awareness of errors;Awareness of deficits Arousal/Alertness: Awake/alert Orientation Level: Appears intact for tasks assessed Behavior During Session: [facility] for tasks performed Following Commands: Follows multi-step commands inconsistently Safety/Judgement: Decreased awareness of safety precautions;Decreased safety judgement for tasks assessed;Impulsive;Decreased awareness of need for assistance Awareness of Errors: Assistance required to identify errors made;Assistance required to correct errors made    Balance     End of Session PT - End of Session Equipment Utilized During Treatment: Left knee immobilizer;Gait belt Activity Tolerance: Patient tolerated treatment well Patient left: in chair;with call bell/phone within reach   GP     [facility] 09/29/2011, 9:01 AM

## 2011-09-29 NOTE — Progress Notes (Signed)
   Subjective: 4 Days Post-Op Procedure(s) (LRB): IRRIGATION AND DEBRIDEMENT KNEE WITH POLY EXCHANGE (Left) Patient reports pain as mild.   Patient seen in rounds with Dr. Darrelyn Hillock. Patient is well, and has had no acute complaints or problems. He reports that he has been able to get up with therapy some. No events overnight. He denies chest pain and shortness of breath. He has suddenly decided that he wants to go to SNF instead of home due to his needs for IV treatment at home.    Objective: Vital signs in last 24 hours: Temp:  [97.9 F (36.6 C)-99 F (37.2 C)] 99 F (37.2 C) (09/09 0626) Pulse Rate:  [62-66] 66  (09/09 0626) Resp:  [17-18] 17  (09/09 0741) BP: (148-163)/(55-91) 155/61 mmHg (09/09 0626) SpO2:  [96 %-100 %] 96 % (09/09 0626)  Intake/Output from previous day:  Intake/Output Summary (Last 24 hours) at 09/29/11 0755 Last data filed at 09/29/11 0744  Gross per 24 hour  Intake   1000 ml  Output   2150 ml  Net  -1150 ml    Intake/Output this shift: Total I/O In: -  Out: 200 [Urine:200]  Labs:  [facility] 09/29/11 0640 09/28/11 0510 09/27/11 0510  HGB 8.8* 9.3* 9.0*    Basename 09/29/11 0640 09/28/11 0510  WBC 9.2 9.3  RBC 3.24* 3.46*  HCT 26.7* 28.7*  PLT 380 362    Basename 09/29/11 0640 09/27/11 0510  NA 133* 131*  K 3.8 3.8  CL 100 97  CO2 25 23  BUN 19 18  CREATININE 1.41* 1.26  GLUCOSE 97 101*  CALCIUM 8.7 8.0*   EXAM General - Patient is Alert and Oriented Extremity - Neurologically intact Sensation intact distally Intact pulses distally Dorsiflexion/Plantar flexion intact Dressing/Incision - clean, dry, no drainage Motor Function - intact, moving foot and toes well on exam.   Past Medical History  Diagnosis Date  . Hypertension   . Hypercholesteremia   . Anxiety   . Obesity (BMI 35.0-39.9 without comorbidity)     Assessment/Plan: 4 Days Post-Op Procedure(s) (LRB): IRRIGATION AND DEBRIDEMENT KNEE WITH POLY EXCHANGE  (Left) Active Problems:  Infection of total knee replacement Acute blood loss anemia, postoperative  Advance diet Up with therapy D/C IV fluids Continue ABX therapy due to infection in left total knee  DVT Prophylaxis - Xarelto Weight-Bearing as tolerated to left leg Will continue to plan for discharge today but will hopefully be able to get SNF placement, preferably [facility] [place] per the patient  Annel Zunker LAUREN 09/29/2011, 7:55 AM

## 2011-09-29 NOTE — Progress Notes (Signed)
Subjective: Much better today, Strept, in wound.Dr. Orvan Falconer recommends Vancomycin IV. Patient now informs [place] that he prefers SNF instead of going home.   Objective: Vital signs in last 24 hours: Temp:  [97.9 F (36.6 C)-99 F (37.2 C)] 99 F (37.2 C) (09/09 0626) Pulse Rate:  [62-66] 66  (09/09 0626) Resp:  [17-18] 17  (09/09 0741) BP: (148-163)/(55-91) 155/61 mmHg (09/09 0626) SpO2:  [96 %-100 %] 96 % (09/09 0626)  Intake/Output from previous day: 09/08 0701 - 09/09 0700 In: 1000 [P.O.:600; IV Piggyback:400] Out: 1950 [Urine:1950] Intake/Output this shift: Total I/O In: -  Out: 200 [Urine:200]   Basename 09/29/11 0640 09/28/11 0510 09/27/11 0510  HGB 8.8* 9.3* 9.0*    Basename 09/29/11 0640 09/28/11 0510  WBC 9.2 9.3  RBC 3.24* 3.46*  HCT 26.7* 28.7*  PLT 380 362    Basename 09/29/11 0640 09/27/11 0510  NA 133* 131*  K 3.8 3.8  CL 100 97  CO2 25 23  BUN 19 18  CREATININE 1.41* 1.26  GLUCOSE 97 101*  CALCIUM 8.7 8.0*   No results found for this basename: LABPT:2,INR:2 in the last 72 hours  Neurologically intact  Assessment/Plan: SNF Today.   Alyssabeth Bruster A 09/29/2011, 9:41 AM

## 2011-09-29 NOTE — Progress Notes (Signed)
Patient ID: Joe [name], male   DOB: 09-Jan-1936, 76 y.o.   MRN: [id number]    [facility]    Date of Admission:  09/23/2011           Day 7 vancomycin Active Problems:  Infection of total knee replacement  Postoperative anemia due to acute blood loss      . atorvastatin  40 mg Oral Daily  . ferrous sulfate  325 mg Oral TID PC  . isosorbide mononitrate  60 mg Oral Daily  . lisinopril  10 mg Oral Daily  . pantoprazole  40 mg Oral Daily  . rivaroxaban  10 mg Oral Q breakfast  . Tamsulosin HCl  0.4 mg Oral Daily  . vancomycin  1,000 mg Intravenous Q24H  . DISCONTD: vancomycin  1,000 mg Intravenous Q24H    Subjective: He is having some pain in his knee but overall is feeling better and is working with physical therapy. He plans on being discharged to [facility] nursing home later today.  Objective: Temp:  [98 F (36.7 C)-99 F (37.2 C)] 98.4 F (36.9 C) (09/09 1334) Pulse Rate:  [61-66] 61  (09/09 1334) Resp:  [16-18] 16  (09/09 1334) BP: (123-155)/(55-66) 123/66 mmHg (09/09 1334) SpO2:  [96 %-100 %] 98 % (09/09 1334)  General: He is alert and comfortable laying in bed Skin: New right arm PICC His knee is bandaged in his drain is out  Lab Results Lab Results  Component Value Date   WBC 9.2 09/29/2011   HGB 8.8* 09/29/2011   HCT 26.7* 09/29/2011   MCV 82.4 09/29/2011   PLT 380 09/29/2011    Lab Results  Component Value Date   CREATININE 1.41* 09/29/2011   BUN 19 09/29/2011   NA 133* 09/29/2011   K 3.8 09/29/2011   CL 100 09/29/2011   CO2 25 09/29/2011    Lab Results  Component Value Date   ALT 10 09/23/2011   AST 16 09/23/2011   ALKPHOS 78 09/23/2011   BILITOT 0.3 09/23/2011      Microbiology: Recent Results (from the past 240 hour(s))  MRSA PCR SCREENING     Status: Normal   Collection Time   09/25/11 11:00 AM      Component Value Range Status Comment   MRSA by PCR NEGATIVE  NEGATIVE Final   WOUND CULTURE     Status: Normal   Collection Time   09/25/11  2:28  PM      Component Value Range Status Comment   Specimen Description WOUND LEFT KNEE JOINT   Final    Special Requests NONE   Final    Gram Stain     Final    Value: RARE WBC PRESENT, PREDOMINANTLY PMN     NO SQUAMOUS EPITHELIAL CELLS SEEN     NO ORGANISMS SEEN   Culture FEW STREPTOCOCCUS GROUP G   Final    Report Status 09/27/2011 FINAL   Final   ANAEROBIC CULTURE     Status: Normal (Preliminary result)   Collection Time   09/25/11  2:28 PM      Component Value Range Status Comment   Specimen Description WOUND LEFT KNEE JOINT   Final    Special Requests NONE   Final    Gram Stain     Final    Value: RARE WBC PRESENT, PREDOMINANTLY PMN     NO SQUAMOUS EPITHELIAL CELLS SEEN     NO ORGANISMS SEEN   Culture  Final    Value: NO ANAEROBES ISOLATED; CULTURE IN PROGRESS FOR 5 DAYS   Report Status PENDING   Incomplete     Assessment: He is improving on IV vancomycin for her relapsed group G streptococcal prosthetic knee infection. I will plan on 6 weeks of IV vancomycin before converting to a new oral, suppressive regimen such as amoxicillin.  Plan: 1. Continue vancomycin for 5 more weeks 2. Followup in her clinic in 4 weeks 3. Please call if I can be of any further assistance while he is here  Cliffton Asters, MD [facility] [phone] pager   [phone] cell 09/29/2011, 2:32 PM

## 2011-09-29 NOTE — Progress Notes (Signed)
Pt d/c to [facility]. Gave report to the nurse, Britt Boozer.

## 2011-09-29 NOTE — Progress Notes (Signed)
Subjective: Joe [name] looks great supine pain control ambulating with assistance according to his report. He has no concerns. Not certain what ultimate cultures grew as the initial aspirate was out of the hospital for orthopedic surgery obviously follows closely. He is having no chest pain or shortness of breath and is clearly medically stable at this point.  Objective: Vital signs in last 24 hours: Temp:  [97.9 F (36.6 C)-99 F (37.2 C)] 99 F (37.2 C) (09/09 0626) Pulse Rate:  [62-66] 66  (09/09 0626) Resp:  [17-18] 18  (09/09 0626) BP: (148-163)/(55-91) 155/61 mmHg (09/09 0626) SpO2:  [96 %-100 %] 96 % (09/09 0626) Weight change:   CBG (last 3)  No results found for this basename: GLUCAP:3 in the last 72 hours  Intake/Output from previous day: 09/08 0701 - 09/09 0700 In: 1000 [P.O.:600; IV Piggyback:400] Out: 1950 [Urine:1950]  Physical Exam: Patient is awake alert no distress. No JVD or bruits. Face symmetric. Lungs clear. Cardiovascular exam regular rate and rhythm no murmur. Abdomen is benign. Calves soft and nontender. Intact distal pulses. Left knee remains stressed with an ice pack applied. Neurologically he is normal.   Lab Results:  Basename 09/29/11 0640 09/27/11 0510  NA 133* 131*  K 3.8 3.8  CL 100 97  CO2 25 23  GLUCOSE 97 101*  BUN 19 18  CREATININE 1.41* 1.26  CALCIUM 8.7 8.0*  MG -- --  PHOS -- --   No results found for this basename: AST:2,ALT:2,ALKPHOS:2,BILITOT:2,PROT:2,ALBUMIN:2 in the last 72 hours  Basename 09/29/11 0640 09/28/11 0510  WBC 9.2 9.3  NEUTROABS -- --  HGB 8.8* 9.3*  HCT 26.7* 28.7*  MCV 82.4 82.9  PLT 380 362   Lab Results  Component Value Date   INR 1.23 09/24/2011   INR 1.22 09/23/2011   INR 1.18 04/24/2011   No results found for this basename: CKTOTAL:3,CKMB:3,CKMBINDEX:3,TROPONINI:3 in the last 72 hours No results found for this basename: TSH,T4TOTAL,FREET3,T3FREE,THYROIDAB in the last 72 hours No results found for this  basename: VITAMINB12:2,FOLATE:2,FERRITIN:2,TIBC:2,IRON:2,RETICCTPCT:2 in the last 72 hours  Studies/Results: No results found.   Assessment/Plan: #1 infected left knee prosthesis status post revision and clean out on antibiotics at this point currently doing well we'll leave ultimate guidance on duration of antibiotics and the need for infectious disease consultation totally up to orthopedic surgery  #2 coronary artery disease with remote coronary artery bypass grafting all stable  #3 essential hypertension acceptable control in the perioperative period  #4 hyperlipidemia stable  #5 postoperative anemia currently overall stable the upper 8-9 range would not transfuse as asymptomatic at this time and stable  Plan disposition up to orthopedic surgery. The other decisions pending her antibiotic therapy which orthopedics should become coordinating based upon outpatient aspirate and perioperative findings.   LOS: 6 days   Jered Heiny A 09/29/2011, 7:24 AM

## 2011-09-30 LAB — ANAEROBIC CULTURE

## 2011-10-20 ENCOUNTER — Encounter (INDEPENDENT_AMBULATORY_CARE_PROVIDER_SITE_OTHER): Payer: Medicare Other | Admitting: Ophthalmology

## 2011-10-23 ENCOUNTER — Inpatient Hospital Stay: Payer: Medicare Other | Admitting: Infectious Diseases

## 2011-10-29 ENCOUNTER — Inpatient Hospital Stay: Payer: Medicare Other | Admitting: Internal Medicine

## 2012-02-06 ENCOUNTER — Other Ambulatory Visit: Payer: Self-pay | Admitting: Family Medicine

## 2012-02-06 DIAGNOSIS — R42 Dizziness and giddiness: Secondary | ICD-10-CM

## 2012-02-10 ENCOUNTER — Ambulatory Visit
Admission: RE | Admit: 2012-02-10 | Discharge: 2012-02-10 | Disposition: A | Discharge status: Home / Self Care | Payer: Medicare PPO | Source: Ambulatory Visit | Attending: Family Medicine | Admitting: Family Medicine

## 2012-02-10 DIAGNOSIS — R42 Dizziness and giddiness: Secondary | ICD-10-CM

## 2012-04-09 ENCOUNTER — Inpatient Hospital Stay (HOSPITAL_COMMUNITY)
Admission: EM | Admit: 2012-04-09 | Discharge: 2012-04-12 | DRG: 313 | Disposition: A | Discharge status: Home / Self Care | Payer: Medicare PPO | Attending: Endocrinology | Admitting: Endocrinology

## 2012-04-09 ENCOUNTER — Encounter (HOSPITAL_COMMUNITY): Payer: Self-pay | Admitting: Adult Health

## 2012-04-09 ENCOUNTER — Emergency Department (HOSPITAL_COMMUNITY): Payer: Medicare PPO

## 2012-04-09 DIAGNOSIS — Z792 Long term (current) use of antibiotics: Secondary | ICD-10-CM

## 2012-04-09 DIAGNOSIS — Z96659 Presence of unspecified artificial knee joint: Secondary | ICD-10-CM

## 2012-04-09 DIAGNOSIS — I4430 Unspecified atrioventricular block: Secondary | ICD-10-CM | POA: Diagnosis present

## 2012-04-09 DIAGNOSIS — D649 Anemia, unspecified: Secondary | ICD-10-CM | POA: Diagnosis present

## 2012-04-09 DIAGNOSIS — F411 Generalized anxiety disorder: Secondary | ICD-10-CM | POA: Diagnosis present

## 2012-04-09 DIAGNOSIS — E871 Hypo-osmolality and hyponatremia: Secondary | ICD-10-CM | POA: Diagnosis present

## 2012-04-09 DIAGNOSIS — E669 Obesity, unspecified: Secondary | ICD-10-CM | POA: Diagnosis present

## 2012-04-09 DIAGNOSIS — E875 Hyperkalemia: Secondary | ICD-10-CM | POA: Diagnosis present

## 2012-04-09 DIAGNOSIS — N400 Enlarged prostate without lower urinary tract symptoms: Secondary | ICD-10-CM | POA: Diagnosis present

## 2012-04-09 DIAGNOSIS — I2510 Atherosclerotic heart disease of native coronary artery without angina pectoris: Secondary | ICD-10-CM | POA: Diagnosis present

## 2012-04-09 DIAGNOSIS — I10 Essential (primary) hypertension: Secondary | ICD-10-CM | POA: Diagnosis present

## 2012-04-09 DIAGNOSIS — Z8719 Personal history of other diseases of the digestive system: Secondary | ICD-10-CM

## 2012-04-09 DIAGNOSIS — Z9089 Acquired absence of other organs: Secondary | ICD-10-CM

## 2012-04-09 DIAGNOSIS — I4891 Unspecified atrial fibrillation: Secondary | ICD-10-CM

## 2012-04-09 DIAGNOSIS — N179 Acute kidney failure, unspecified: Secondary | ICD-10-CM | POA: Diagnosis present

## 2012-04-09 DIAGNOSIS — R634 Abnormal weight loss: Secondary | ICD-10-CM | POA: Diagnosis present

## 2012-04-09 DIAGNOSIS — I498 Other specified cardiac arrhythmias: Secondary | ICD-10-CM | POA: Diagnosis present

## 2012-04-09 DIAGNOSIS — Z79899 Other long term (current) drug therapy: Secondary | ICD-10-CM

## 2012-04-09 DIAGNOSIS — R0789 Other chest pain: Principal | ICD-10-CM | POA: Diagnosis present

## 2012-04-09 DIAGNOSIS — Z881 Allergy status to other antibiotic agents status: Secondary | ICD-10-CM

## 2012-04-09 DIAGNOSIS — E785 Hyperlipidemia, unspecified: Secondary | ICD-10-CM | POA: Insufficient documentation

## 2012-04-09 DIAGNOSIS — Z6835 Body mass index (BMI) 35.0-35.9, adult: Secondary | ICD-10-CM

## 2012-04-09 DIAGNOSIS — R112 Nausea with vomiting, unspecified: Secondary | ICD-10-CM | POA: Diagnosis present

## 2012-04-09 DIAGNOSIS — N289 Disorder of kidney and ureter, unspecified: Secondary | ICD-10-CM | POA: Diagnosis present

## 2012-04-09 DIAGNOSIS — Z951 Presence of aortocoronary bypass graft: Secondary | ICD-10-CM

## 2012-04-09 DIAGNOSIS — R079 Chest pain, unspecified: Secondary | ICD-10-CM

## 2012-04-09 HISTORY — DX: Infection and inflammatory reaction due to other internal joint prosthesis, initial encounter: Z96.659

## 2012-04-09 HISTORY — DX: Gastrointestinal hemorrhage, unspecified: K92.2

## 2012-04-09 HISTORY — DX: Hyperlipidemia, unspecified: E78.5

## 2012-04-09 HISTORY — DX: Chronic atrial fibrillation, unspecified: I48.20

## 2012-04-09 HISTORY — DX: Chronic kidney disease, stage 3 unspecified: N18.30

## 2012-04-09 HISTORY — DX: Atherosclerotic heart disease of native coronary artery without angina pectoris: I25.10

## 2012-04-09 HISTORY — DX: Chronic kidney disease, stage 3 (moderate): N18.3

## 2012-04-09 HISTORY — DX: Infection and inflammatory reaction due to other internal joint prosthesis, initial encounter: T84.59XA

## 2012-04-09 HISTORY — DX: Bradycardia, unspecified: R00.1

## 2012-04-09 LAB — BASIC METABOLIC PANEL
BUN: 43 mg/dL — ABNORMAL HIGH (ref 6–23)
Calcium: 9.5 mg/dL (ref 8.4–10.5)
Chloride: 97 mEq/L (ref 96–112)
Chloride: 98 mEq/L (ref 96–112)
Creatinine, Ser: 1.68 mg/dL — ABNORMAL HIGH (ref 0.50–1.35)
GFR calc Af Amer: 45 mL/min — ABNORMAL LOW (ref >90)
GFR calc Af Amer: 45 mL/min — ABNORMAL LOW (ref >90)
Potassium: 7.3 mEq/L (ref 3.5–5.1)

## 2012-04-09 LAB — CBC
HCT: 35.8 % — ABNORMAL LOW (ref 39.0–52.0)
Hemoglobin: 11.9 g/dL — ABNORMAL LOW (ref 13.0–17.0)
RDW: 15.8 % — ABNORMAL HIGH (ref 11.5–15.5)
WBC: 10.6 10*3/uL — ABNORMAL HIGH (ref 4.0–10.5)

## 2012-04-09 LAB — POCT I-STAT TROPONIN I: Troponin i, poc: 0 ng/mL (ref 0.00–0.08)

## 2012-04-09 LAB — PRO B NATRIURETIC PEPTIDE: Pro B Natriuretic peptide (BNP): 1960 pg/mL — ABNORMAL HIGH (ref 0–125)

## 2012-04-09 MED ORDER — DEXTROSE 50 % IV SOLN
1.0000 | Freq: Once | INTRAVENOUS | Status: AC
  Administered 2012-04-10: 50 mL via INTRAVENOUS
  Filled 2012-04-09: qty 50

## 2012-04-09 MED ORDER — ONDANSETRON HCL 4 MG PO TABS: 4.0000 mg | ORAL_TABLET | Freq: Four times a day (QID) | ORAL | Status: DC | PRN

## 2012-04-09 MED ORDER — ONDANSETRON HCL 4 MG/2ML IJ SOLN
4.0000 mg | Freq: Four times a day (QID) | INTRAMUSCULAR | Status: DC | PRN
  Administered 2012-04-10: 4 mg via INTRAVENOUS
  Filled 2012-04-09: qty 2

## 2012-04-09 MED ORDER — ZOLPIDEM TARTRATE 5 MG PO TABS: 5.0000 mg | ORAL_TABLET | Freq: Every evening | ORAL | Status: DC | PRN

## 2012-04-09 MED ORDER — SODIUM POLYSTYRENE SULFONATE 15 GM/60ML PO SUSP
30.0000 g | Freq: Once | ORAL | Status: AC
  Administered 2012-04-10: 30 g via ORAL
  Filled 2012-04-09: qty 120

## 2012-04-09 MED ORDER — INSULIN ASPART 100 UNIT/ML ~~LOC~~ SOLN
10.0000 [IU] | Freq: Once | SUBCUTANEOUS | Status: AC
  Administered 2012-04-10: 10 [IU] via INTRAVENOUS

## 2012-04-09 MED ORDER — TAMSULOSIN HCL 0.4 MG PO CAPS
0.4000 mg | ORAL_CAPSULE | Freq: Every day | ORAL | Status: DC
  Administered 2012-04-10 – 2012-04-12 (×3): 0.4 mg via ORAL
  Filled 2012-04-09 (×3): qty 1

## 2012-04-09 MED ORDER — HEPARIN SODIUM (PORCINE) 5000 UNIT/ML IJ SOLN
5000.0000 [IU] | Freq: Three times a day (TID) | INTRAMUSCULAR | Status: DC
  Administered 2012-04-10: 5000 [IU] via SUBCUTANEOUS
  Filled 2012-04-09 (×4): qty 1

## 2012-04-09 MED ORDER — SODIUM CHLORIDE 0.9 % IJ SOLN
3.0000 mL | Freq: Two times a day (BID) | INTRAMUSCULAR | Status: DC
  Administered 2012-04-09 – 2012-04-12 (×3): 3 mL via INTRAVENOUS

## 2012-04-09 MED ORDER — DEXTROSE-NACL 5-0.9 % IV SOLN
INTRAVENOUS | Status: DC
  Administered 2012-04-09: via INTRAVENOUS

## 2012-04-09 MED ORDER — ASPIRIN 81 MG PO CHEW
324.0000 mg | CHEWABLE_TABLET | Freq: Once | ORAL | Status: AC
  Administered 2012-04-09: 324 mg via ORAL
  Filled 2012-04-09: qty 4

## 2012-04-09 MED ORDER — ISOSORBIDE MONONITRATE ER 30 MG PO TB24
30.0000 mg | ORAL_TABLET | Freq: Every day | ORAL | Status: DC
  Administered 2012-04-10 – 2012-04-12 (×3): 30 mg via ORAL
  Filled 2012-04-09 (×3): qty 1

## 2012-04-09 MED ORDER — MORPHINE SULFATE 2 MG/ML IJ SOLN
2.0000 mg | INTRAMUSCULAR | Status: DC | PRN
  Administered 2012-04-11: 2 mg via INTRAVENOUS
  Filled 2012-04-09: qty 1

## 2012-04-09 MED ORDER — ATORVASTATIN CALCIUM 40 MG PO TABS
40.0000 mg | ORAL_TABLET | Freq: Every day | ORAL | Status: DC
  Administered 2012-04-10 – 2012-04-11 (×2): 40 mg via ORAL
  Filled 2012-04-09 (×3): qty 1

## 2012-04-09 MED ORDER — ALPRAZOLAM 0.25 MG PO TABS: 0.2500 mg | ORAL_TABLET | Freq: Every evening | ORAL | Status: DC | PRN

## 2012-04-09 MED ORDER — NITROGLYCERIN 0.4 MG SL SUBL
0.4000 mg | SUBLINGUAL_TABLET | SUBLINGUAL | Status: DC | PRN
  Administered 2012-04-09 – 2012-04-11 (×2): 0.4 mg via SUBLINGUAL
  Filled 2012-04-09 (×3): qty 25

## 2012-04-09 NOTE — ED Notes (Signed)
4 Attempts made at starting an IV on patient. IV team paged.

## 2012-04-09 NOTE — ED Notes (Signed)
Presents with 3 days of chest pain, generalized to entire front of chest described as sharp associated with SOB. Nothing makes pain better, getting upset makes pain worse. Pt was sent here by his primary doctor. Denies radiation.

## 2012-04-09 NOTE — ED Provider Notes (Signed)
History     CSN: [id number]  Arrival date & time 04/09/12  1742   First MD Initiated Contact with Patient 04/09/12 1914      Chief Complaint  Patient presents with  . Chest Pain    (Consider location/radiation/quality/duration/timing/severity/associated sxs/prior treatment) HPI Comments: Patient presents with left-sided chest pain. He describes it as aching feeling it sharp at times. It's worse with exertion and occasional it's worse with deep breathing. He also feels more short of breath than his baseline. He states it's been worsening and intermittent over the last 3 days. He does have a history of coronary artery disease status post bypass surgery several years ago. Has a has a history of facial fibrillation and is on xarelto. [name] He has no associated nausea or diaphoresis with the pain. He was seen by his primary care physician today and sent over here for evaluation.  Patient is a 77 y.o. male presenting with chest pain.  Chest Pain Associated symptoms: shortness of breath   Associated symptoms: no abdominal pain, no back pain, no cough, no diaphoresis, no dizziness, no fatigue, no fever, no headache, no nausea, no numbness, not vomiting and no weakness     Past Medical History  Diagnosis Date  . Hypertension   . Hypercholesteremia   . Anxiety   . Obesity (BMI 35.0-39.9 without comorbidity)     Past Surgical History  Procedure Laterality Date  . Coronary artery bypass graft    . Joint replacement    . Foot surgery    . Leg surgery    . Appendectomy    . I&d extremity  04/19/2011    Procedure: IRRIGATION AND DEBRIDEMENT EXTREMITY;  Surgeon: Eugenia Mcalpine, MD;  Location: [facility];  Service: Orthopedics;  Laterality: Left;  aspiration of left total knee fluid and irrigation and debridement of left calf abscess  . Esophagogastroduodenoscopy  04/21/2011    Procedure: ESOPHAGOGASTRODUODENOSCOPY (EGD);  Surgeon: Beverley Fiedler, MD;  Location: [facility];  Service: Gastroenterology;   Laterality: N/A;  . Esophagogastroduodenoscopy  04/22/2011    Procedure: ESOPHAGOGASTRODUODENOSCOPY (EGD);  Surgeon: Beverley Fiedler, MD;  Location: [facility];  Service: Gastroenterology;  Laterality: N/A;  travel case  . I&d knee with poly exchange  09/25/2011    Procedure: IRRIGATION AND DEBRIDEMENT KNEE WITH POLY EXCHANGE;  Surgeon: Jacki Cones, MD;  Location: [facility];  Service: Orthopedics;  Laterality: Left;  removal patella    History reviewed. No pertinent family history.  History  Substance Use Topics  . Smoking status: Never Smoker   . Smokeless tobacco: Never Used  . Alcohol Use: No      Review of Systems  Constitutional: Negative for fever, chills, diaphoresis and fatigue.  HENT: Negative for congestion, rhinorrhea and sneezing.   Eyes: Negative.   Respiratory: Positive for shortness of breath. Negative for cough and chest tightness.   Cardiovascular: Positive for chest pain. Negative for leg swelling.  Gastrointestinal: Negative for nausea, vomiting, abdominal pain, diarrhea and blood in stool.  Genitourinary: Negative for frequency, hematuria, flank pain and difficulty urinating.  Musculoskeletal: Negative for back pain and arthralgias.  Skin: Negative for rash.  Neurological: Negative for dizziness, speech difficulty, weakness, numbness and headaches.    Allergies  Clarithromycin  Home Medications   Current Outpatient Rx  Name  Route  Sig  Dispense  Refill  . ALPRAZolam (XANAX) 0.25 MG tablet   Oral   Take 0.25 mg by mouth at bedtime as needed. insomnia         .  atorvastatin (LIPITOR) 40 MG tablet   Oral   Take 40 mg by mouth daily.         . ferrous sulfate 325 (65 FE) MG tablet   Oral   Take 325 mg by mouth daily with breakfast.         . isosorbide mononitrate (IMDUR) 60 MG 24 hr tablet   Oral   Take 30 mg by mouth daily.          [name] lisinopril (PRINIVIL,ZESTRIL) 10 MG tablet   Oral   Take 10 mg by mouth daily.         . Tamsulosin  HCl (FLOMAX) 0.4 MG CAPS   Oral   Take 0.4 mg by mouth daily.           BP 134/104  Pulse 67  Temp(Src) 98.5 F (36.9 C) (Oral)  Resp 18  SpO2 100%  Physical Exam  Constitutional: He is oriented to person, place, and time. He appears well-developed and well-nourished.  HENT:  Head: Normocephalic and atraumatic.  Eyes: Pupils are equal, round, and reactive to light.  Neck: Normal range of motion. Neck supple.  Cardiovascular: Normal rate, regular rhythm and normal heart sounds.   Pulmonary/Chest: Effort normal and breath sounds normal. No respiratory distress. He has no wheezes. He has no rales. He exhibits no tenderness.  Abdominal: Soft. Bowel sounds are normal. There is no tenderness. There is no rebound and no guarding.  Musculoskeletal: Normal range of motion. He exhibits edema.  Lymphadenopathy:    He has no cervical adenopathy.  Neurological: He is alert and oriented to person, place, and time.  Skin: Skin is warm and dry. No rash noted.  Psychiatric: He has a normal mood and affect.    ED Course  Procedures (including critical care time)   Dg Chest 2 View  04/09/2012  *RADIOLOGY REPORT*  Clinical Data: Chest pain  CHEST - 2 VIEW  Comparison: 09/26/2011  Findings: Cardiomediastinal silhouette is stable.  Status post median sternotomy again noted.  Dextroscoliosis thoracic spine again noted.  No acute infiltrate or pulmonary edema.  Stable degenerative changes.  IMPRESSION: No active disease.  No significant change.   Original Report Authenticated By: Natasha Mead, M.D.     Dg Chest 2 View  04/09/2012  *RADIOLOGY REPORT*  Clinical Data: Chest pain  CHEST - 2 VIEW  Comparison: 09/26/2011  Findings: Cardiomediastinal silhouette is stable.  Status post median sternotomy again noted.  Dextroscoliosis thoracic spine again noted.  No acute infiltrate or pulmonary edema.  Stable degenerative changes.  IMPRESSION: No active disease.  No significant change.   Original Report  Authenticated By: Natasha Mead, M.D.     Date: 04/09/2012  Rate: 67  Rhythm: normal sinus rhythm and premature ventricular contractions (PVC)  QRS Axis: normal  Intervals: normal  ST/T Wave abnormalities: ST depressions inferiorly, mild, t wave inversions laterally  Conduction Disutrbances:none  Narrative Interpretation:   Old EKG Reviewed: changes noted  Results for orders placed during the hospital encounter of 04/09/12  CBC      Result Value Range   WBC 10.6 (*) 4.0 - 10.5 K/uL   RBC 4.35  4.22 - 5.81 MIL/uL   Hemoglobin 11.9 (*) 13.0 - 17.0 g/dL   HCT 16.1 (*) 09.6 - 04.5 %   MCV 82.3  78.0 - 100.0 fL   MCH 27.4  26.0 - 34.0 pg   MCHC 33.2  30.0 - 36.0 g/dL   RDW 40.9 (*) 81.1 - 91.4 %  Platelets 368  150 - 400 K/uL  BASIC METABOLIC PANEL      Result Value Range   Sodium 125 (*) 135 - 145 mEq/L   Potassium 7.3 (*) 3.5 - 5.1 mEq/L   Chloride 97  96 - 112 mEq/L   CO2 13 (*) 19 - 32 mEq/L   Glucose, Bld 92  70 - 99 mg/dL   BUN 43 (*) 6 - 23 mg/dL   Creatinine, Ser 1.61 (*) 0.50 - 1.35 mg/dL   Calcium 9.9  8.4 - 09.6 mg/dL   GFR calc non Af Amer 39 (*) >90 mL/min   GFR calc Af Amer 45 (*) >90 mL/min  PRO B NATRIURETIC PEPTIDE      Result Value Range   Pro B Natriuretic peptide (BNP) 1960.0 (*) 0 - 125 pg/mL  POCT I-STAT TROPONIN I      Result Value Range   Troponin i, poc 0.00  0.00 - 0.08 ng/mL   Comment 3            Dg Chest 2 View  04/09/2012  *RADIOLOGY REPORT*  Clinical Data: Chest pain  CHEST - 2 VIEW  Comparison: 09/26/2011  Findings: Cardiomediastinal silhouette is stable.  Status post median sternotomy again noted.  Dextroscoliosis thoracic spine again noted.  No acute infiltrate or pulmonary edema.  Stable degenerative changes.  IMPRESSION: No active disease.  No significant change.   Original Report Authenticated By: Natasha Mead, M.D.         1. Chest pain       MDM  Patient with left-sided chest pain and some mild EKG changes. His troponin is  negative. His BNP is elevated and his chest x-ray is clear. He currently is denying any pain. He was given aspirin here I initially ordered nitroglycerin but currently is denying chest pain. His potassium was elevated however I was there when they were drawn the blood in it did take a long time to try to prevent so I suspect this might be a hemolyzed specimen however in repeating a test. This is currently pending. I discussed the case with Dr. Dierdre Searles who will admit the patient.        Rolan Bucco, MD 04/09/12 2043

## 2012-04-09 NOTE — ED Notes (Signed)
Unsuccessful x 2 iv sticks: 20 ga rt. fa and 22 ga. lt. Hand.

## 2012-04-09 NOTE — H&P (Signed)
[facility] History and Physical  Joe [name] ZOX:[id number] DOB: 02/15/1935    PCP:  Michail Jewels.  Chief Complaint: chest pain and EKG changes.  HPI: Joe [name] is an 77 y.o. male with hx of HTN, hyperlipidemia, known CAD s/p CABG, anxiety, obesity, presents to the ER with chest pain.  His complaint has been rather vague and inconsistent.  He was saying that he regularly has intermittent CP, but then said he hadn't had any CP in 2 years.  He has no pleuritic CP.  No associated N/V, diaphoresis, or shortness of breath.  Evaluation in the ER included a D-Dimer of 8.0, CTPA pending. Cr 1.66, K of 7.3, repeat 6.9, His EKG showed slight ST depression that's new.  No QTc prolongation and no peak T's.  He was painfree when I saw him.  Hospitalist was asked to admit him for hyperkalemia, and for chest pain work up.  Rewiew of Systems:  Constitutional: Negative for malaise, fever and chills. No significant weight loss or weight gain Eyes: Negative for eye pain, redness and discharge, diplopia, visual changes, or flashes of light. ENMT: Negative for ear pain, hoarseness, nasal congestion, sinus pressure and sore throat. No headaches; tinnitus, drooling, or problem swallowing. Cardiovascular: Negative for chest pain, palpitations, diaphoresis, dyspnea and peripheral edema. ; No orthopnea, PND Respiratory: Negative for cough, hemoptysis, wheezing and stridor. No pleuritic chestpain. Gastrointestinal: Negative for nausea, vomiting, diarrhea, constipation, abdominal pain, melena, blood in stool, hematemesis, jaundice and rectal bleeding.    Genitourinary: Negative for frequency, dysuria, incontinence,flank pain and hematuria; Musculoskeletal: Negative for back pain and neck pain. Negative for swelling and trauma.;  Skin: . Negative for pruritus, rash, abrasions, bruising and skin lesion.; ulcerations Neuro: Negative for headache, lightheadedness and neck stiffness. Negative for weakness,  altered level of consciousness , altered mental status, extremity weakness, burning feet, involuntary movement, seizure and syncope.  Psych: negative for anxiety, depression, insomnia, tearfulness, panic attacks, hallucinations, paranoia, suicidal or homicidal ideation    Past Medical History  Diagnosis Date  . Hypertension   . Hypercholesteremia   . Anxiety   . Obesity (BMI 35.0-39.9 without comorbidity)     Past Surgical History  Procedure Laterality Date  . Coronary artery bypass graft    . Joint replacement    . Foot surgery    . Leg surgery    . Appendectomy    . I&d extremity  04/19/2011    Procedure: IRRIGATION AND DEBRIDEMENT EXTREMITY;  Surgeon: Eugenia Mcalpine, MD;  Location: [facility];  Service: Orthopedics;  Laterality: Left;  aspiration of left total knee fluid and irrigation and debridement of left calf abscess  . Esophagogastroduodenoscopy  04/21/2011    Procedure: ESOPHAGOGASTRODUODENOSCOPY (EGD);  Surgeon: Beverley Fiedler, MD;  Location: [facility];  Service: Gastroenterology;  Laterality: N/A;  . Esophagogastroduodenoscopy  04/22/2011    Procedure: ESOPHAGOGASTRODUODENOSCOPY (EGD);  Surgeon: Beverley Fiedler, MD;  Location: [facility];  Service: Gastroenterology;  Laterality: N/A;  travel case  . I&d knee with poly exchange  09/25/2011    Procedure: IRRIGATION AND DEBRIDEMENT KNEE WITH POLY EXCHANGE;  Surgeon: Jacki Cones, MD;  Location: [facility];  Service: Orthopedics;  Laterality: Left;  removal patella    Medications:  HOME MEDS: Prior to Admission medications   Medication Sig Start Date End Date Taking? Authorizing Provider  ALPRAZolam (XANAX) 0.25 MG tablet Take 0.25 mg by mouth at bedtime as needed. insomnia   Yes Historical Provider, MD  atorvastatin (LIPITOR) 40 MG tablet Take  40 mg by mouth daily.   Yes Historical Provider, MD  ferrous sulfate 325 (65 FE) MG tablet Take 325 mg by mouth daily with breakfast. 09/29/11 09/28/12 Yes Amber Tamala Ser, PA-C   isosorbide mononitrate (IMDUR) 60 MG 24 hr tablet Take 30 mg by mouth daily.    Yes Historical Provider, MD  lisinopril (PRINIVIL,ZESTRIL) 10 MG tablet Take 10 mg by mouth daily.   Yes Historical Provider, MD  Tamsulosin HCl (FLOMAX) 0.4 MG CAPS Take 0.4 mg by mouth daily.   Yes Historical Provider, MD     Allergies:  Allergies  Allergen Reactions  . Clarithromycin Rash    Social History:   reports that he has never smoked. He has never used smokeless tobacco. He reports that he does not drink alcohol or use illicit drugs.  Family History: History reviewed. No pertinent family history.   Physical Exam: Filed Vitals:   04/09/12 2030 04/09/12 2100 04/09/12 2130 04/09/12 2232  BP: 134/104 109/53 129/48 126/47  Pulse:  65 63 61  Temp:    98.4 F (36.9 C)  TempSrc:    Oral  Resp: 18 15 20 16   Height:    5\' 3"  (1.6 m)  Weight:    88.043 kg (194 lb 1.6 oz)  SpO2:  100% 100% 100%   Blood pressure 126/47, pulse 61, temperature 98.4 F (36.9 C), temperature source Oral, resp. rate 16, height 5\' 3"  (1.6 m), weight 88.043 kg (194 lb 1.6 oz), SpO2 100.00%.  GEN:  Pleasant  patient lying in the stretcher in no acute distress; cooperative with exam. PSYCH:  alert and oriented x4; does not appear anxious or depressed; affect is appropriate. HEENT: Mucous membranes pink and anicteric; PERRLA; EOM intact; no cervical lymphadenopathy nor thyromegaly or carotid bruit; no JVD; There were no stridor. Neck is very supple. Breasts:: Not examined CHEST WALL: No tenderness CHEST: Normal respiration, clear to auscultation bilaterally.  HEART: Regular rate and rhythm.  There are no murmur, rub, or gallops.   BACK: No kyphosis or scoliosis; no CVA tenderness ABDOMEN: soft and non-tender; no masses, no organomegaly, normal abdominal bowel sounds; no pannus; no intertriginous candida. There is no rebound and no distention. Rectal Exam: Not done EXTREMITIES: No bone or joint deformity; age-appropriate  arthropathy of the hands and knees; no edema; no ulcerations.  There is no calf tenderness. Genitalia: not examined PULSES: 2+ and symmetric SKIN: Normal hydration no rash or ulceration CNS: Cranial nerves 2-12 grossly intact no focal lateralizing neurologic deficit.  Speech is fluent; uvula elevated with phonation, facial symmetry and tongue midline. DTR are normal bilaterally, cerebella exam is intact, barbinski is negative and strengths are equaled bilaterally.  No sensory loss.   Labs on Admission:  Basic Metabolic Panel:  Recent Labs Lab 04/09/12 1858 04/09/12 2031  NA 125* 125*  K 7.3* 6.9*  CL 97 98  CO2 13* 15*  GLUCOSE 92 102*  BUN 43* 43*  CREATININE 1.66* 1.68*  CALCIUM 9.9 9.5   Liver Function Tests: No results found for this basename: AST, ALT, ALKPHOS, BILITOT, PROT, ALBUMIN,  in the last 168 hours No results found for this basename: LIPASE, AMYLASE,  in the last 168 hours No results found for this basename: AMMONIA,  in the last 168 hours CBC:  Recent Labs Lab 04/09/12 1858  WBC 10.6*  HGB 11.9*  HCT 35.8*  MCV 82.3  PLT 368   Cardiac Enzymes: No results found for this basename: CKTOTAL, CKMB, CKMBINDEX, TROPONINI,  in the  last 168 hours  CBG: No results found for this basename: GLUCAP,  in the last 168 hours   Radiological Exams on Admission: Dg Chest 2 View  04/09/2012  *RADIOLOGY REPORT*  Clinical Data: Chest pain  CHEST - 2 VIEW  Comparison: 09/26/2011  Findings: Cardiomediastinal silhouette is stable.  Status post median sternotomy again noted.  Dextroscoliosis thoracic spine again noted.  No acute infiltrate or pulmonary edema.  Stable degenerative changes.  IMPRESSION: No active disease.  No significant change.   Original Report Authenticated By: Natasha Mead, M.D.     EKG: Independently reviewed. NSR with slight ST-T depression.  No peak T's.   Assessment/Plan Present on Admission:  . Chest pain . Hyperkalemia . CAD (coronary artery  disease) . HTN (hypertension) . Hyperlipidemia  PLAN:  Will admit this patient for chest pain work up.  Since D dimer is so elevated, will obtain CTPA.  Will cycle his troponin.  His HyperK will be treated with Kayexalate and D50 with insulin.  I have stopped his ace-I for now.  I have continued his other home meds. He is stable, full code, and will be admitted to Dr Rinaldo Cloud service since he is with [facility].  I spoke with Dr Rinaldo Cloud tonight.  Other plans as per orders.  Code Status: FULL Unk Lightning, MD. [facility] Pager [phone] 7pm to 7am.  04/09/2012, 11:54 PM

## 2012-04-10 ENCOUNTER — Inpatient Hospital Stay (HOSPITAL_COMMUNITY): Payer: Medicare PPO

## 2012-04-10 ENCOUNTER — Encounter (HOSPITAL_COMMUNITY): Payer: Self-pay | Admitting: Physician Assistant

## 2012-04-10 DIAGNOSIS — N289 Disorder of kidney and ureter, unspecified: Secondary | ICD-10-CM | POA: Diagnosis present

## 2012-04-10 LAB — BASIC METABOLIC PANEL
BUN: 40 mg/dL — ABNORMAL HIGH (ref 6–23)
CO2: 14 mEq/L — ABNORMAL LOW (ref 19–32)
Chloride: 101 mEq/L (ref 96–112)
GFR calc Af Amer: 45 mL/min — ABNORMAL LOW (ref >90)
GFR calc non Af Amer: 41 mL/min — ABNORMAL LOW (ref >90)
Glucose, Bld: 105 mg/dL — ABNORMAL HIGH (ref 70–99)
Glucose, Bld: 106 mg/dL — ABNORMAL HIGH (ref 70–99)
Potassium: 5.8 mEq/L — ABNORMAL HIGH (ref 3.5–5.1)
Potassium: 5.3 mEq/L — ABNORMAL HIGH (ref 3.5–5.1)
Sodium: 130 mEq/L — ABNORMAL LOW (ref 135–145)
Sodium: 132 mEq/L — ABNORMAL LOW (ref 135–145)

## 2012-04-10 LAB — HEPATIC FUNCTION PANEL
Albumin: 2.5 g/dL — ABNORMAL LOW (ref 3.5–5.2)
Alkaline Phosphatase: 78 U/L (ref 39–117)
Total Protein: 8.1 g/dL (ref 6.0–8.3)

## 2012-04-10 LAB — CBC
Platelets: 359 10*3/uL (ref 150–400)
RDW: 15.8 % — ABNORMAL HIGH (ref 11.5–15.5)
WBC: 9.3 10*3/uL (ref 4.0–10.5)

## 2012-04-10 LAB — MAGNESIUM: Magnesium: 1.4 mg/dL — ABNORMAL LOW (ref 1.5–2.5)

## 2012-04-10 LAB — CREATININE, SERUM
GFR calc Af Amer: 43 mL/min — ABNORMAL LOW (ref >90)
GFR calc non Af Amer: 37 mL/min — ABNORMAL LOW (ref >90)

## 2012-04-10 LAB — LIPASE, BLOOD: Lipase: 31 U/L (ref 11–59)

## 2012-04-10 LAB — PROTIME-INR: INR: 1.16 (ref 0.00–1.49)

## 2012-04-10 MED ORDER — IOHEXOL 350 MG/ML SOLN
80.0000 mL | Freq: Once | INTRAVENOUS | Status: AC | PRN
  Administered 2012-04-10: 75 mL via INTRAVENOUS

## 2012-04-10 MED ORDER — SODIUM CHLORIDE 0.9 % IV SOLN
INTRAVENOUS | Status: DC
  Administered 2012-04-10 – 2012-04-11 (×2): via INTRAVENOUS
  Administered 2012-04-12: 1000 mL via INTRAVENOUS

## 2012-04-10 MED ORDER — PANTOPRAZOLE SODIUM 40 MG PO TBEC
40.0000 mg | DELAYED_RELEASE_TABLET | Freq: Every day | ORAL | Status: DC
  Administered 2012-04-10: 40 mg via ORAL
  Filled 2012-04-10: qty 1

## 2012-04-10 MED ORDER — SODIUM POLYSTYRENE SULFONATE 15 GM/60ML PO SUSP
30.0000 g | Freq: Once | ORAL | Status: AC
  Administered 2012-04-10: 30 g via ORAL
  Filled 2012-04-10: qty 120

## 2012-04-10 MED ORDER — ASPIRIN EC 81 MG PO TBEC
81.0000 mg | DELAYED_RELEASE_TABLET | Freq: Every day | ORAL | Status: DC
  Administered 2012-04-10 – 2012-04-12 (×3): 81 mg via ORAL
  Filled 2012-04-10 (×4): qty 1

## 2012-04-10 NOTE — Progress Notes (Signed)
Nutrition Brief Note  Patient identified on the Malnutrition Screening Tool (MST) Report with a score of 2.   Body mass index is 34.3 kg/(m^2). Patient meets criteria for obesity, unspecified based on current BMI.   Current diet order is Heart Healthy, patient did not each much lunch today due to flavor preferences, but reports he has a good appetite. Labs and medications reviewed.   Patient advised to review the menu and order foods per preference. No nutrition additional interventions warranted at this time. If nutrition issues arise, please consult RD.   Linnell Fulling, RD, LDN Pager #: [phone] After-Hours Pager #: [phone]

## 2012-04-10 NOTE — Progress Notes (Signed)
Spoke with Dr. Evlyn Kanner with prior note information.  Will be contacting cardiology.  Prn Zofran ordered for nausea.

## 2012-04-10 NOTE — Progress Notes (Signed)
Pt had episode of 3rd degree heart block @ 1417.   Was actively vomiting @ time of arrhythmia.  HR down to 30s.  Pt denied chest pain.  Skin warm and dry.  1020 BMET  Had potassium level of 5.8.  Stat dose kayexalate given after this specimen obtained.  Pt has had lg bowel movement s/p kayexalate - this was 2nd dose - had one on night shift. 12 Lead EKG obtained and showed NSR with rate of 62 @ 1442.  Dr. Evlyn Kanner paged to report.

## 2012-04-10 NOTE — Plan of Care (Signed)
Problem: Phase I Progression Outcomes Goal: Aspirin unless contraindicated Outcome: Completed/Met Date Met:  04/10/12 Given in ED

## 2012-04-10 NOTE — Progress Notes (Signed)
Subjective: Feels a little better. Still some vague lower bilat chest pain on and off. No radiation or upper CP. Breathing OK. No nausea. No abd pain. Ate well at breakfast  Objective: Vital signs in last 24 hours: Temp:  [97.5 F (36.4 C)-99.2 F (37.3 C)] 97.5 F (36.4 C) (03/22 0437) Pulse Rate:  [59-67] 59 (03/22 0437) Resp:  [14-20] 17 (03/22 0437) BP: (109-162)/(47-125) 113/49 mmHg (03/22 0437) SpO2:  [96 %-100 %] 100 % (03/22 0437) Weight:  [87.816 kg (193 lb 9.6 oz)-88.043 kg (194 lb 1.6 oz)] 87.816 kg (193 lb 9.6 oz) (03/22 0437)  Intake/Output from previous day: 03/21 0701 - 03/22 0700 In: 355 [I.V.:355] Out: 325 [Urine:325] Intake/Output this shift:    General: alert, sitting at 30 degrees in no distress. Face symmetric, neck supple. Lungs clear no wheeze, ht regular distant. abd soft NT, extrems no edema. Awake, mildly confused.   Lab Results   Recent Labs  04/09/12 1858 04/09/12 2249  WBC 10.6* 9.3  RBC 4.35 3.77*  HGB 11.9* 10.3*  HCT 35.8* 30.9*  MCV 82.3 82.0  MCH 27.4 27.3  RDW 15.8* 15.8*  PLT 368 359    Recent Labs  04/09/12 1858 04/09/12 2031 04/09/12 2249  NA 125* 125*  --   K 7.3* 6.9*  --   CL 97 98  --   CO2 13* 15*  --   GLUCOSE 92 102*  --   BUN 43* 43*  --   CREATININE 1.66* 1.68* 1.74*  CALCIUM 9.9 9.5  --   Sinus rhythm with frequent Premature ventricular complexes Nonspecific T wave abnormality Abnormal ECG Results for Joe [name], Joe [name] (MRN [id number]) as of 04/10/2012 10:00  Ref. Range 04/09/2012 22:49 04/10/2012 05:25  Troponin I Latest Range: <0.30 ng/mL <0.30 <0.30  2013  ECHO Left ventricle: The cavity size was normal. Systolic function was normal. The estimated ejection fraction was in the range of 55% to 65%. Wall motion was normal; there were no obvious regional wall motion abnormalities when assessed with contrast. The study is not technically sufficient to allow evaluation of LV diastolic  function.   Studies/Results: Dg Chest 2 View  04/09/2012  *RADIOLOGY REPORT*  Clinical Data: Chest pain  CHEST - 2 VIEW  Comparison: 09/26/2011  Findings: Cardiomediastinal silhouette is stable.  Status post median sternotomy again noted.  Dextroscoliosis thoracic spine again noted.  No acute infiltrate or pulmonary edema.  Stable degenerative changes.  IMPRESSION: No active disease.  No significant change.   Original Report Authenticated By: Joe [name], M.D.    Ct Angio Chest Pe W/cm &/or Wo Cm  04/10/2012  *RADIOLOGY REPORT*  Clinical Data: 77 year old male with chest pain and elevated D- dimer.  CT ANGIOGRAPHY CHEST  Technique:  Multidetector CT imaging of the chest using the standard protocol during bolus administration of intravenous contrast. Multiplanar reconstructed images including MIPs were obtained and reviewed to evaluate the vascular anatomy.  Contrast: 75mL OMNIPAQUE IOHEXOL 350 MG/ML SOLN  Comparison: 04/09/2012 and prior chest radiographs  Findings: This is a technically adequate study but respiratory motion artifact in the lower lungs limits sensitivity.  No pulmonary emboli are identified.  Ectasia of the thoracic aorta identified within the ascending aorta measuring 3.8 cm and the descending aorta measuring 2.8 cm. Cardiomegaly, cardiac surgical changes and heavy coronary artery calcifications noted. There is no evidence of pleural or pericardial effusion. No enlarged lymph nodes are identified.  There is no evidence of airspace disease, consolidation, nodule, mass or endobronchial/endotracheal lesion.  No acute or suspicious bony abnormalities are present.  Remote rib fractures are present. No upper abdominal abnormalities are identified except for cholelithiasis.  IMPRESSION: No evidence of acute abnormality - no evidence of pulmonary emboli.  Ectatic thoracic aorta, cardiomegaly and cardiac surgical changes.  Cholelithiasis.   Original Report Authenticated By: Joe [name], M.D.      Scheduled Meds: . atorvastatin  40 mg Oral q1800  . isosorbide mononitrate  30 mg Oral Daily  . sodium chloride  3 mL Intravenous Q12H  . sodium polystyrene  30 g Oral Once  . tamsulosin  0.4 mg Oral Daily   Continuous Infusions: . dextrose 5 % and 0.9% NaCl 50 mL/hr at 04/09/12 2354   PRN Meds:morphine injection, nitroGLYCERIN, ondansetron (ZOFRAN) IV, ondansetron, zolpidem  Assessment/Plan: Patient Active Problem List  Diagnosis  .    .   .   .   .   .   .   . Chest pain: first 2 troponins are fine. CT angio showed no PE despite increased D-Dimer  . Hyperkalemia: quite unusual. Will stop heparin since this can cause high K at times. Go with compression stocking. Repeat K and Kayexylate  . CAD (coronary artery disease): see above, No CHF  . :Hypertension: BP reasonable  . Hyperlipidemia: on Rx  . Acute renal insufficiency: repeat pending, on fluids Hx Afib: In SR/SB at the present BPH: on Rx Anemia: mild      LOS: 1 day   Joe [name] Joe [name] 04/10/2012, 9:57 AM

## 2012-04-10 NOTE — Progress Notes (Signed)
  Echocardiogram 2D Echocardiogram has been performed.  Joe [name] [name] 04/10/2012, 2:15 PM

## 2012-04-10 NOTE — Consult Note (Signed)
CARDIOLOGY CONSULT NOTE  Patient ID: Joe [name], MRN: [id number], DOB/AGE: 1935/03/13 77 y.o. Admit date: 04/09/2012   Date of Consult: 04/10/2012 Primary Physician: No primary provider on file. Primary Cardiologist: Consult note in 03/2011 reports Dr. Donnie Aho as primary but he was seen by [facility] throughout that admission.  Chief Complaint: chest pain Reason for Consult: CP, transient CHB during vomiting  HPI: Joe [name] is a 77 y/o M with history of CAD s/p CABG 1995, HTN, HL, obesity, reported history atrial fibrillation, whom we are asked to see for high grade AVB/CHB during vomiting. He had a similar episode during an admission last year 03/2012 for infected knee prosthesis complicated by upper GI bleed, ARF, transient high-grade AVB during vomiting in setting of hyperkalemia, renal insufficiency. He had recurrence of knee infection in Sept 2013 with group G strep and required several weeks of IV vanc. He was admitted 04/09/12 with complaints of chest pain. Labs were markedly abnormal on admisson with Na 125, K 7.3, BUN/Cr 43/1.66 (previously 1.2-1.4 in Sept 2013), troponins neg x 4, mildly anemic at 10.3, d-dimer 8.02. CT angio without PE, + cholelithasis, ectatic thoracic aorta, cardiomegaly. He has been treated with Kayexalate with K of 5.8 today. This afternoon he developed nausea and vomiting and had transient complete heart block that resolved without intervention. He denies dizziness or syncope at that time. He reports that he came to the ER because he went to his PCP's office and they ran an EKG. He is a vague historian and his history changes slightly if repeat questions are asked. He reports intermittent chest pain, once every 2-3 weeks with the most recent episode starting 2 weeks ago. It has been fairly constant since it started. It is described as a blunt pain. He sometimes has SOB with it. CP is not worse with exertion. It is somewhat worse with inspiration, getting upset, positional  changes, and to a mild degree with palpation. He also has occasional nausea and vomiting that are unrelated to the chest pain. Abdomen is tender. He endorses sweating with the chest pain "especially in the summer." He is currently maintaining NSR. He reports significant weight loss -- weight is 193 here; was 262 in 04/2011.   Past Medical History  Diagnosis Date  . Hypertension   . Hypercholesteremia   . Anxiety   . Obesity (BMI 35.0-39.9 without comorbidity)   . CAD (coronary artery disease)     a. s/p 2vCABG 1995 at [facility] (SVG-diag and LAD seq, SVG-RCA). b. Patent grafts 2001.   [name] Chronic a-fib   . Upper GI bleed     a. 03/2011: EGD x 2 showed large blood clot in duodenal bulb and gastric antrum, likely DU -> felt NSAID induced, H pylori AB neg.   . Bradycardia     a. 03/2011: Transient episodes of bradycardia/high-grade A-V block probably related to increased vagal tone during n/v.  . Renal insufficiency   . Infection of prosthetic knee joint     a. 03/2011 tx with abx, abscess of L calf s/p surgery. b. Recurrence 09/2011 s/p debridement/synovectomy, rx with vanc (group G strep).      Most Recent Cardiac Studies: 2D Echo 03/2011 Study Conclusions Left ventricle: The cavity size was normal. Systolic function was normal. The estimated ejection fraction was in the range of 55% to 65%. Wall motion was normal; there were no obvious regional wall motion abnormalities when assessed with contrast. The study is not technically sufficient to allow evaluation of LV  diastolic function.  Cardiac Cath 2001 INDICATIONS: Joe [name] is a 77 year old gentleman with a history of a coronary artery bypass grafting at [facility]. He had two-vessel coronary artery disease by heart catheterization back in 1995. He went to [facility] and  had a saphenous vein graft to his diagonal and LAD sequentially, and then a saphenous vein graft to his right coronary artery. He presented to the office last  week ith a months long history of progressive chest tightness and shortness of breath. He is referred for a heart catheterization, for further evaluation.  DESCRIPTION OF PROCEDURE: The right femoral artery was cannulated using a  modified Seldinger technique.  HEMODYNAMICS:  LV pressure: 125/16.  Aortic pressure: 121/71.  ANGIOGRAPHY:  1. Left main coronary artery: The left main is rather long.  2. Left anterior descending coronary artery: The LAD is occluded proximally.  3. Circumflex coronary artery: The circumflex artery has only minor luminal irregularities. There is an OM-I that is fairly normal.  4. Right coronary artery: Is occluded proximally.  5. Saphenous vein graft to the first diagonal artery and then the left anterior descending coronary artery: Is fairly normal. The anastomosis to the first diagonal artery has minor irregularities, but no critical stenosis. The anastomosis to the LAD is normal. Flow down into the LAD is quite normal, and this vessel appears to be normal distal to the graft. It is clear to say that the LAD has an 80% stenosis in the very proximal aspect of this vessel as it fills in a retrograde fashion.  6. Saphenous vein graft to the right coronary artery: Is a very large and somewhat ectatic vessel. It was fairly difficult to fill this large artery with the catheter, but the flow through it appears to be normal. This graft appears to be 5.0 to 6.0 mm in diameter. The anastomosis to the distal right coronary artery appears to be normal. The flow into the PDA and posterior lateral segment artery is normal.  LEFT VENTRICULOGRAM: Is performed in a 30-degree RAO position. It reveals normal left ventricular systolic function with an ejection fraction of around 65%-70%. The heart is mildly dilated.  COMPLICATIONS: None.  CONCLUSIONS:  1. Patent saphenous vein grafts to the diagonal and left anterior descending coronary artery, and a patent saphenous vein graft to the right  coronary artery.  2. Mildly dilated but rather normally-functioning left ventricle.  3. Morbid obesity.  PLAN: We will continue with medical therapy and weight loss therapy for Mr. Nuon.   Surgical History:  Past Surgical History  Procedure Laterality Date  . Coronary artery bypass graft    . Joint replacement    . Foot surgery    . Leg surgery    . Appendectomy    . I&d extremity  04/19/2011    Procedure: IRRIGATION AND DEBRIDEMENT EXTREMITY;  Surgeon: Eugenia Mcalpine, MD;  Location: [facility];  Service: Orthopedics;  Laterality: Left;  aspiration of left total knee fluid and irrigation and debridement of left calf abscess  . Esophagogastroduodenoscopy  04/21/2011    Procedure: ESOPHAGOGASTRODUODENOSCOPY (EGD);  Surgeon: Beverley Fiedler, MD;  Location: [facility];  Service: Gastroenterology;  Laterality: N/A;  . Esophagogastroduodenoscopy  04/22/2011    Procedure: ESOPHAGOGASTRODUODENOSCOPY (EGD);  Surgeon: Beverley Fiedler, MD;  Location: [facility];  Service: Gastroenterology;  Laterality: N/A;  travel case  . I&d knee with poly exchange  09/25/2011    Procedure: IRRIGATION AND DEBRIDEMENT KNEE WITH POLY EXCHANGE;  Surgeon: Jacki Cones, MD;  Location: [facility];  Service: Orthopedics;  Laterality: Left;  removal patella     Home Meds: Prior to Admission medications   Medication Sig Start Date End Date Taking? Authorizing Provider  ALPRAZolam (XANAX) 0.25 MG tablet Take 0.25 mg by mouth at bedtime as needed. insomnia   Yes Historical Provider, MD  atorvastatin (LIPITOR) 40 MG tablet Take 40 mg by mouth daily.   Yes Historical Provider, MD  ferrous sulfate 325 (65 FE) MG tablet Take 325 mg by mouth daily with breakfast. 09/29/11 09/28/12 Yes Amber Tamala Ser, PA-C  isosorbide mononitrate (IMDUR) 60 MG 24 hr tablet Take 30 mg by mouth daily.    Yes Historical Provider, MD  lisinopril (PRINIVIL,ZESTRIL) 10 MG tablet Take 10 mg by mouth daily.   Yes Historical Provider, MD  Tamsulosin HCl (FLOMAX)  0.4 MG CAPS Take 0.4 mg by mouth daily.   Yes Historical Provider, MD    Inpatient Medications:  . atorvastatin  40 mg Oral q1800  . isosorbide mononitrate  30 mg Oral Daily  . sodium chloride  3 mL Intravenous Q12H  . tamsulosin  0.4 mg Oral Daily    Allergies:  Allergies  Allergen Reactions  . Clarithromycin Rash    History   Social History  . Marital Status: Married    Spouse Name: N/A    Number of Children: N/A  . Years of Education: N/A   Occupational History  . Not on file.   Social History Main Topics  . Smoking status: Never Smoker   . Smokeless tobacco: Never Used  . Alcohol Use: No  . Drug Use: No  . Sexually Active: No   Other Topics Concern  . Not on file   Social History Narrative  . No narrative on file     Family History  Problem Relation Age of Onset  . Heart disease Mother      Review of Systems: General: negative for chills, fever, night sweats .  Cardiovascular: see above Dermatological: negative for rash Respiratory: negative for wheezing. + coughing occasionally Urologic: negative for hematuria Abdominal: negative for diarrhea, bright red blood per rectum, melena, or hematemesis Neurologic: negative for visual changes, syncope. Occasional dizziness when he's out working All other systems reviewed and are otherwise negative except as noted above.  Labs:  Recent Labs  04/09/12 2249 04/10/12 0525 04/10/12 1030  TROPONINI <0.30 <0.30 <0.30   Lab Results  Component Value Date   WBC 9.3 04/09/2012   HGB 10.3* 04/09/2012   HCT 30.9* 04/09/2012   MCV 82.0 04/09/2012   PLT 359 04/09/2012     Recent Labs Lab 04/10/12 1020  NA 130*  K 5.8*  CL 101  CO2 16*  BUN 40*  CREATININE 1.66*  CALCIUM 9.5  GLUCOSE 105*   Lab Results  Component Value Date   DDIMER 8.02* 04/09/2012    Radiology/Studies:  Dg Chest 2 View 04/09/2012  *RADIOLOGY REPORT*  Clinical Data: Chest pain  CHEST - 2 VIEW  Comparison: 09/26/2011  Findings:  Cardiomediastinal silhouette is stable.  Status post median sternotomy again noted.  Dextroscoliosis thoracic spine again noted.  No acute infiltrate or pulmonary edema.  Stable degenerative changes.  IMPRESSION: No active disease.  No significant change.   Original Report Authenticated By: Natasha Mead, M.D.    Ct Angio Chest Pe W/cm &/or Wo Cm 04/10/2012  *RADIOLOGY REPORT*  Clinical Data: 77 year old male with chest pain and elevated D- dimer.  CT ANGIOGRAPHY CHEST  Technique:  Multidetector CT imaging  of the chest using the standard protocol during bolus administration of intravenous contrast. Multiplanar reconstructed images including MIPs were obtained and reviewed to evaluate the vascular anatomy.  Contrast: 75mL OMNIPAQUE IOHEXOL 350 MG/ML SOLN  Comparison: 04/09/2012 and prior chest radiographs  Findings: This is a technically adequate study but respiratory motion artifact in the lower lungs limits sensitivity.  No pulmonary emboli are identified.  Ectasia of the thoracic aorta identified within the ascending aorta measuring 3.8 cm and the descending aorta measuring 2.8 cm. Cardiomegaly, cardiac surgical changes and heavy coronary artery calcifications noted. There is no evidence of pleural or pericardial effusion. No enlarged lymph nodes are identified.  There is no evidence of airspace disease, consolidation, nodule, mass or endobronchial/endotracheal lesion. No acute or suspicious bony abnormalities are present.  Remote rib fractures are present. No upper abdominal abnormalities are identified except for cholelithiasis.  IMPRESSION: No evidence of acute abnormality - no evidence of pulmonary emboli.  Ectatic thoracic aorta, cardiomegaly and cardiac surgical changes.  Cholelithiasis.   Original Report Authenticated By: Harmon Pier, M.D.    EKG:  04/09/12: NSR 67bpm freq PVCs, inerolateral ST-T changes 04/10/12: NSR 62bpm nonspecific T wave changes V5-V6, inferior changes improved  Physical Exam: Blood  pressure 107/66, pulse 60, temperature 97 F (36.1 C), temperature source Oral, resp. rate 18, height 5\' 3"  (1.6 m), weight 193 lb 9.6 oz (87.816 kg), SpO2 100.00%. General: Well developed, well nourished WM in no acute distress. Head: Normocephalic, atraumatic, sclera non-icteric, no xanthomas, nares are without discharge.  Neck: JVD not elevated. Lungs: Clear bilaterally to auscultation without wheezes, rales, or rhonchi. Breathing is unlabored. Heart: RRR with S1 S2 with 2/6 SEM LUSB and apex. No rubs or gallops appreciated. Abdomen: Soft, prominent tenderness epigastrum and RUQ, non-distended with normoactive bowel sounds. No hepatomegaly.  Msk:  Strength and tone appear normal for age. Extremities: No clubbing or cyanosis. Some effusion of L knee where pt states he has chronic inflammation, some tenderness. Otherwise no LEE. Distal pedal pulses are 2+ and equal bilaterally. Neuro: Alert and oriented X 3 but inconsistent historian at times. No facial asymmetry. No focal deficit. Moves all extremities spontaneously. Psych:  Somewhat flat affect but does answer questions appropriately.   Assessment and Plan:   1. Chest pain, atypical, with negative enzymes thus far despite very prolonged pain - atypical for ACS. Add low dose EC ASA. Difficult to interpret EKG changes in setting of hyperkalemia. Likely will need nuclear stress test at some point. Agree with 2D Echo.  2. Transient high grade AV block/CHB during vomiting - suspect vagal response in setting of electrolyte distrubance (he had very similar episode under circumstances of vomiting/hyperkalemia in 03/2011). Avoid AV nodal blocking agents and follow on tele. 3. Acute renal failure with hyperkalemia/hyponatremia - unclear precipitant. ?Dehydration from n/v. Agree with discontinuation of ACEI. Being hydrated. Watch Cr carefully given CTA. Primary team managing lytes. 4. Significant weight loss (pt reports 100lbs unintentional in 1 yr) with  nausea/vomiting - check GI labs including amylase, lipase, hepatic function panel and check abd [place]. CT angio of chest incidentally showed cholelithiasis. Symptoms are concerning and need further eval.  5. CAD s/p CABG 1995 - add low dose ASA as above, cont statin for now. No BB due to #2. 6. HTN - controlled. 7. Prior history of atrial fibrillation - currently maintaining NSR. 8. Prior significant infected knee prosthesis - defer to primary team. The patient said he was on chronic antibiotics but these are not listed. Afebrile  and normal WBC. 9. History of UGI bleed/ulcer - hemoccult stools. Follow CBC. Add Protonix.  10. Mild anemia - see above.  Signed, Ronie Spies PA-C 04/10/2012, 3:33 PM As above, patient seen and examined. Briefly he is a 77 year old male with a past medical history of coronary artery disease status post coronary artery bypass and graft, atrial fibrillation, hypertension, hyperlipidemia who am asked to evaluate for chest pain and complete heart block. Patient is a very poor historian. He describes intermittent chest pain for several months. The pain is diffuse and described as a dull sensation. Pain does not radiate. He also describes intermittent nausea and vomiting. He also describes approximately 60 pound weight loss in the past one year. He was admitted on March 21 with the above complaints. He was noted to have severe hyponatremia, hyperkalemia and renal insufficiency. He has been treated with Kayexalate and IV fluids with some improvement in his electrolyte abnormalities. Cardiac enzymes have been negative. Electrocardiogram initially showed sinus rhythm, PVCs and inferolateral T-wave inversion. Followup showed sinus rhythm with nonspecific ST changes. The patient did develop nausea and vomiting today and had transient complete heart block in the setting of his nausea/vomiting and hyperkalemia. Cardiology is asked to evaluate. Exam shows some right upper quadrant tenderness to  palpation. Agree with hydration and continued therapy for his hyponatremia/hyperkalemia. ACE inhibitor is on hold. Would pursue right upper quadrant ultrasound to exclude cholecystitis and also check amylase and lipase. He also needs further evaluation of his weight loss. Once the above evaluation is complete he should most likely have a functional study which could be performed as an outpatient. His complete heart block which was transient was most likely increased vagal tone from his nausea and vomiting occurring in the setting of hyperkalemia as well. Follow telemetry. Olga Millers 4:55 PM

## 2012-04-11 LAB — CBC
HCT: 27.2 % — ABNORMAL LOW (ref 39.0–52.0)
Hemoglobin: 9 g/dL — ABNORMAL LOW (ref 13.0–17.0)
MCHC: 33.1 g/dL (ref 30.0–36.0)
RBC: 3.38 MIL/uL — ABNORMAL LOW (ref 4.22–5.81)
WBC: 6.3 10*3/uL (ref 4.0–10.5)

## 2012-04-11 LAB — COMPREHENSIVE METABOLIC PANEL
ALT: 10 U/L (ref 0–53)
AST: 19 U/L (ref 0–37)
Albumin: 2.2 g/dL — ABNORMAL LOW (ref 3.5–5.2)
Alkaline Phosphatase: 74 U/L (ref 39–117)
Glucose, Bld: 90 mg/dL (ref 70–99)
Potassium: 4.9 mEq/L (ref 3.5–5.1)
Sodium: 134 mEq/L — ABNORMAL LOW (ref 135–145)
Total Protein: 7.6 g/dL (ref 6.0–8.3)

## 2012-04-11 MED ORDER — PANTOPRAZOLE SODIUM 40 MG PO TBEC
40.0000 mg | DELAYED_RELEASE_TABLET | Freq: Two times a day (BID) | ORAL | Status: DC
  Administered 2012-04-11 – 2012-04-12 (×3): 40 mg via ORAL
  Filled 2012-04-11 (×2): qty 1

## 2012-04-11 MED ORDER — GI COCKTAIL ~~LOC~~
30.0000 mL | Freq: Once | ORAL | Status: AC
  Administered 2012-04-11: 30 mL via ORAL
  Filled 2012-04-11: qty 30

## 2012-04-11 NOTE — Progress Notes (Signed)
Subjective: Had N/V with bradycardia yesterday. No more issues since. Ate a little supper and good breakfast. Chest wall still hurts. All Troponins neg and. CT Angio was negative. WIll double PPI   Objective: Vital signs in last 24 hours: Temp:  [97 F (36.1 C)-98 F (36.7 C)] 97.7 F (36.5 C) (03/23 0915) Pulse Rate:  [56-69] 59 (03/23 0915) Resp:  [16-20] 16 (03/23 0915) BP: (107-128)/(60-73) 108/66 mmHg (03/23 0915) SpO2:  [98 %-100 %] 99 % (03/23 0915) Weight:  [87.8 kg (193 lb 9 oz)] 87.8 kg (193 lb 9 oz) (03/23 0653)  Intake/Output from previous day: 03/22 0701 - 03/23 0700 In: 1060.8 [P.O.:50; I.V.:1010.8] Out: 475 [Urine:475] Intake/Output this shift: Total I/O In: 1376.7 [I.V.:1376.7] Out: 200 [Urine:200]  General: alert, sitting up in no distress. Face symmetric, neck supple. Lungs clear no rub. abd soft NT no edema. Awake, alert, clear speech  Lab Results   Recent Labs  04/09/12 2249 04/11/12 0620  WBC 9.3 6.3  RBC 3.77* 3.38*  HGB 10.3* 9.0*  HCT 30.9* 27.2*  MCV 82.0 80.5  MCH 27.3 26.6  RDW 15.8* 16.1*  PLT 359 315    Recent Labs  04/10/12 1531 04/11/12 0620  NA 132* 134*  K 5.3* 4.9  CL 103 103  CO2 14* 17*  GLUCOSE 106* 90  BUN 36* 29*  CREATININE 1.60* 1.49*  CALCIUM 9.1 8.7   Results for ANUP, BRIGHAM (MRN [id number]) as of 04/11/2012 09:42  Ref. Range 04/09/2012 22:49 04/10/2012 05:25 04/10/2012 10:20 04/10/2012 10:30  Troponin I Latest Range: <0.30 ng/mL <0.30 <0.30  <0.30   Studies/Results: Dg Chest 2 View  04/09/2012  *RADIOLOGY REPORT*  Clinical Data: Chest pain  CHEST - 2 VIEW  Comparison: 09/26/2011  Findings: Cardiomediastinal silhouette is stable.  Status post median sternotomy again noted.  Dextroscoliosis thoracic spine again noted.  No acute infiltrate or pulmonary edema.  Stable degenerative changes.  IMPRESSION: No active disease.  No significant change.   Original Report Authenticated By: Natasha Mead, M.D.    Ct Angio Chest Pe  W/cm &/or Wo Cm  04/10/2012  *RADIOLOGY REPORT*  Clinical Data: 77 year old male with chest pain and elevated D- dimer.  CT ANGIOGRAPHY CHEST  Technique:  Multidetector CT imaging of the chest using the standard protocol during bolus administration of intravenous contrast. Multiplanar reconstructed images including MIPs were obtained and reviewed to evaluate the vascular anatomy.  Contrast: 75mL OMNIPAQUE IOHEXOL 350 MG/ML SOLN  Comparison: 04/09/2012 and prior chest radiographs  Findings: This is a technically adequate study but respiratory motion artifact in the lower lungs limits sensitivity.  No pulmonary emboli are identified.  Ectasia of the thoracic aorta identified within the ascending aorta measuring 3.8 cm and the descending aorta measuring 2.8 cm. Cardiomegaly, cardiac surgical changes and heavy coronary artery calcifications noted. There is no evidence of pleural or pericardial effusion. No enlarged lymph nodes are identified.  There is no evidence of airspace disease, consolidation, nodule, mass or endobronchial/endotracheal lesion. No acute or suspicious bony abnormalities are present.  Remote rib fractures are present. No upper abdominal abnormalities are identified except for cholelithiasis.  IMPRESSION: No evidence of acute abnormality - no evidence of pulmonary emboli.  Ectatic thoracic aorta, cardiomegaly and cardiac surgical changes.  Cholelithiasis.   Original Report Authenticated By: Harmon Pier, M.D.    US Abdomen Complete  04/11/2012  *RADIOLOGY REPORT*  Clinical Data:  77 year old male with abdominal pain, nausea vomiting and acute renal failure.  ABDOMINAL ULTRASOUND COMPLETE  Comparison:  06/13/2009 ultrasound  Findings: Body habitus and bowel gas limits evaluation of the liver, IVC, pancreas and abdominal aorta.  Gallbladder: A 15 mm round hyperechoic structure within the gallbladder is unchanged and may represent a polyp or gallstone.There is no evidence of gallbladder wall  thickening, pericholecystic fluid or sonographic Murphy's sign.  Common Bile Duct:  There is no evidence of intrahepatic or extrahepatic biliary dilation. The CBD measures 4.1 mm in greatest diameter.  Liver: Hepatic parenchyma is heterogeneous - unchanged. No focal abnormalities are identified.  IVC: Not visualized  Pancreas: Not visualized  Spleen:  Within normal limits in size and echotexture.  Right kidney:  The right kidney is atrophic with normal parenchymal echogenicity.  There is no evidence of solid mass, hydronephrosis or definite renal calculi.  The right kidney measures 7.8 cm.  Left kidney:  The left kidney is normal in size and parenchymal echogenicity.  There is no evidence of solid mass, hydronephrosis or definite renal calculi.   The left kidney measures 9.5 cm.  Abdominal Aorta: Not visualized  There is no evidence of ascites.  IMPRESSION: No evidence of acute abnormality.  Unchanged 15 mm round echogenic area within the gallbladder - question gallstone versus polyp. No evidence of cholecystitis.  Atrophic right kidney.  Pancreas, abdominal aorta, IVC and portions of the liver not well visualized as discussed above.   Original Report Authenticated By: Harmon Pier, M.D.     Scheduled Meds: . aspirin EC  81 mg Oral Daily  . atorvastatin  40 mg Oral q1800  . isosorbide mononitrate  30 mg Oral Daily  . pantoprazole  40 mg Oral Q supper  . sodium chloride  3 mL Intravenous Q12H  . tamsulosin  0.4 mg Oral Daily   Continuous Infusions: . sodium chloride 100 mL/hr at 04/10/12 1129  . dextrose 5 % and 0.9% NaCl 50 mL/hr at 04/09/12 2354   PRN Meds:morphine injection, nitroGLYCERIN, ondansetron (ZOFRAN) IV, ondansetron, zolpidem  Assessment/Plan:  Chest pain:all 3 troponins are fine. CT angio showed no PE despite increased D-Dimer Could be a GI or musculoskeletal issue  Hyperkalemia: quite unusual.Much better at 4.9  CAD (coronary artery disease): cards help appreciated. Brief 3rd degree  heart block was likely vagal.  :Hypertension: BP reasonable   Hyperlipidemia: on Rx   Acute renal insufficiency:  on fluids with improvement to 1.49 Hx Afib: In SR/SB at the present  BPH: on Rx  Anemia: mild    LOS: 2 days   Anysa Tacey ALAN 04/11/2012, 9:40 AM

## 2012-04-11 NOTE — Progress Notes (Signed)
   Subjective:  Still with mild vague chest pain; no dyspnea   Objective:  Filed Vitals:   04/10/12 2040 04/11/12 0210 04/11/12 0653 04/11/12 0915  BP: 122/68 110/61 125/60 108/66  Pulse: 69 59 59 59  Temp: 97.5 F (36.4 C) 97.9 F (36.6 C) 98 F (36.7 C) 97.7 F (36.5 C)  TempSrc: Oral Oral Oral Oral  Resp: 18 18 20 16   Height:      Weight:   193 lb 9 oz (87.8 kg)   SpO2: 100% 99% 98% 99%    Intake/Output from previous day:  Intake/Output Summary (Last 24 hours) at 04/11/12 1101 Last data filed at 04/11/12 0837  Gross per 24 hour  Intake 2437.51 ml  Output    675 ml  Net 1762.51 ml    Physical Exam: Physical exam: Well-developed well-nourished in no acute distress.  Skin is warm and dry.  HEENT is normal.  Neck is supple.  Chest is clear to auscultation with normal expansion.  Cardiovascular exam is regular rate and rhythm.  Abdominal exam mild tenderness to palpation Extremities show effusion left knee neuro grossly intact    Lab Results: Basic Metabolic Panel:  Recent Labs  16/10/96 1531 04/10/12 1720 04/11/12 0620  NA 132*  --  134*  K 5.3*  --  4.9  CL 103  --  103  CO2 14*  --  17*  GLUCOSE 106*  --  90  BUN 36*  --  29*  CREATININE 1.60*  --  1.49*  CALCIUM 9.1  --  8.7  MG  --  1.4*  --    CBC:  Recent Labs  04/09/12 2249 04/11/12 0620  WBC 9.3 6.3  HGB 10.3* 9.0*  HCT 30.9* 27.2*  MCV 82.0 80.5  PLT 359 315   Cardiac Enzymes:  Recent Labs  04/09/12 2249 04/10/12 0525 04/10/12 1030  TROPONINI <0.30 <0.30 <0.30     Assessment/Plan:  1 chest pain-patient symptoms are atypical. He is a very difficult historian. Enzymes are negative. Would favor nuclear study for risk stratification following discharge. 2 bradycardia-the patient had complete heart block while vomiting felt most likely related to increased vagal tone. He did have transient Mobitz 1 in the early morning hours. With plan outpatient monitor. 3  hyperkalemia/hyponatremia-improving with hydration. Management per primary care. 4 acute renal failure-improving with hydration. 5 weight loss-evaluation per primary care. 6 coronary artery disease-continue aspirin and statin.  Olga Millers 04/11/2012, 11:01 AM

## 2012-04-11 NOTE — Progress Notes (Signed)
Upon assessment, patient continued to have c/o epigastric/chest pain, though he stated the morphine had helped bring the pain down to a 5-6/10.  Dr Jacky Kindle notified of the continued epigastic/chest pain-received order for one time dose GI cocktail.  On re-assessment, patient stated pain is much improved, down to 1-2/10.  Will continue to monitor.  Blood pressure 109/62, pulse 64, temperature 97.9 F (36.6 C), temperature source Oral, resp. rate 18, height 5\' 3"  (1.6 m), weight 87.8 kg (193 lb 9 oz), SpO2 100.00%. Joe [name]

## 2012-04-11 NOTE — Progress Notes (Signed)
Pt c/o chest pain 9/10 BP115/67, P60 Nitiro, 0.4 sl given. And at 1825 pain 8/10, BP 99/59. P60.   EKG NSR 65. C.Lawerence PA notified and instructed to administer Ms04 as ordered prn.  Pt A&0x3.  Will continue to monitor.  Amanda Pea, Charity fundraiser.

## 2012-04-12 ENCOUNTER — Encounter (HOSPITAL_COMMUNITY): Payer: Self-pay | Admitting: Cardiology

## 2012-04-12 DIAGNOSIS — D649 Anemia, unspecified: Secondary | ICD-10-CM | POA: Diagnosis present

## 2012-04-12 DIAGNOSIS — Z8719 Personal history of other diseases of the digestive system: Secondary | ICD-10-CM | POA: Insufficient documentation

## 2012-04-12 DIAGNOSIS — I4891 Unspecified atrial fibrillation: Secondary | ICD-10-CM

## 2012-04-12 DIAGNOSIS — I2510 Atherosclerotic heart disease of native coronary artery without angina pectoris: Secondary | ICD-10-CM | POA: Diagnosis present

## 2012-04-12 LAB — BASIC METABOLIC PANEL
BUN: 27 mg/dL — ABNORMAL HIGH (ref 6–23)
CO2: 18 mEq/L — ABNORMAL LOW (ref 19–32)
Chloride: 105 mEq/L (ref 96–112)
GFR calc non Af Amer: 44 mL/min — ABNORMAL LOW (ref >90)
Glucose, Bld: 80 mg/dL (ref 70–99)
Potassium: 5.3 mEq/L — ABNORMAL HIGH (ref 3.5–5.1)
Sodium: 134 mEq/L — ABNORMAL LOW (ref 135–145)

## 2012-04-12 LAB — CBC
HCT: 28.5 % — ABNORMAL LOW (ref 39.0–52.0)
Hemoglobin: 9.2 g/dL — ABNORMAL LOW (ref 13.0–17.0)
MCH: 26.7 pg (ref 26.0–34.0)
RBC: 3.45 MIL/uL — ABNORMAL LOW (ref 4.22–5.81)

## 2012-04-12 MED ORDER — ASPIRIN 81 MG PO TBEC: 81.0000 mg | DELAYED_RELEASE_TABLET | Freq: Every day | ORAL | Status: AC

## 2012-04-12 MED ORDER — PANTOPRAZOLE SODIUM 40 MG PO TBEC: 40.0000 mg | DELAYED_RELEASE_TABLET | Freq: Two times a day (BID) | ORAL | Status: AC

## 2012-04-12 NOTE — Progress Notes (Signed)
Subjective:  Says feels good today, not SOB and no real chest pain today.  Not vomiting today. Abdominal pain better.  Objective:  Vital Signs in the last 24 hours: BP 123/70  Pulse 53  Temp(Src) 98.1 F (36.7 C) (Oral)  Resp 18  Ht 5\' 3"  (1.6 m)  Wt 89.7 kg (197 lb 12 oz)  BMI 35.04 kg/m2  SpO2 100%  Physical Exam: Pleasant WM in NAD Lungs:  Clear Cardiac:  Regular rhythm, normal S1 and S2, no S3, 2/6 systolic murmur Abdomen:  Soft, nontender, no masses Extremities:  No edema present  Intake/Output from previous day: 03/23 0701 - 03/24 0700 In: 2077.7 [P.O.:701; I.V.:1376.7] Out: 1350 [Urine:1350]  Weight Filed Weights   04/10/12 0437 04/11/12 0653 04/12/12 0616  Weight: 87.816 kg (193 lb 9.6 oz) 87.8 kg (193 lb 9 oz) 89.7 kg (197 lb 12 oz)    Lab Results: Basic Metabolic Panel:  Recent Labs  03/47/42 0620 04/12/12 0540  NA 134* 134*  K 4.9 5.3*  CL 103 105  CO2 17* 18*  GLUCOSE 90 80  BUN 29* 27*  CREATININE 1.49* 1.50*   CBC:  Recent Labs  04/11/12 0620 04/12/12 0540  WBC 6.3 7.1  HGB 9.0* 9.2*  HCT 27.2* 28.5*  MCV 80.5 82.6  PLT 315 301   Cardiac Enzymes:  Recent Labs  04/09/12 2249 04/10/12 0525 04/10/12 1030  TROPONINI <0.30 <0.30 <0.30    Telemetry: Sinus rhythm  Assessment/Plan:  1. Admission with hyponatremia and chest pain, abdominal pain 2. CAD with prior CABG 3. Hyperkalemia 4. Hypertension  Rec:  Looks better today.  I would stop his IV fluids and let ambulate today.  Unclear about clinical presentation, Had a similar one in 2013. Repeat ECHO.      Darden Palmer  MD [facility]  04/12/2012, 9:07 AM

## 2012-04-12 NOTE — Progress Notes (Signed)
Utilization Review Completed Corrado Hymon J. Doxie Augenstein, RN, BSN, NCM [phone]  

## 2012-04-12 NOTE — Progress Notes (Signed)
All d/c instructions explained and given to pt.  Verbalized understanding.  Pt d/c to home via taxi cab.  Escorted off floor via w/c.  Amanda Pea, Charity fundraiser.

## 2012-04-12 NOTE — Discharge Summary (Signed)
DISCHARGE SUMMARY  Joe [name]  MR#: [id number]  DOB:Jun 05, 1935  Date of Admission: 04/09/2012 Date of Discharge: 04/12/2012  Attending Physician:Robet Crutchfield A  Patient's PCP:No primary provider on file.  Consults: cardiology  Discharge Diagnoses: Principal Problem:   Chest pain Active Problems:   Hyperkalemia   HTN (hypertension)   Acute renal insufficiency   Atrial fibrillation   CAD (coronary artery disease)   Anemia   Discharge Medications:   Medication List    TAKE these medications       ALPRAZolam 0.25 MG tablet  Commonly known as:  XANAX  Take 0.25 mg by mouth at bedtime as needed. insomnia     aspirin 81 MG EC tablet  Take 1 tablet (81 mg total) by mouth daily.     atorvastatin 40 MG tablet  Commonly known as:  LIPITOR  Take 40 mg by mouth daily.     ferrous sulfate 325 (65 FE) MG tablet  Take 325 mg by mouth daily with breakfast.     isosorbide mononitrate 60 MG 24 hr tablet  Commonly known as:  IMDUR  Take 30 mg by mouth daily.     lisinopril 10 MG tablet  Commonly known as:  PRINIVIL,ZESTRIL  Take 10 mg by mouth daily.     pantoprazole 40 MG tablet  Commonly known as:  PROTONIX  Take 1 tablet (40 mg total) by mouth 2 (two) times daily.     tamsulosin 0.4 MG Caps  Commonly known as:  FLOMAX  Take 0.4 mg by mouth daily.        Hospital Procedures: Dg Chest 2 View  04/09/2012  *RADIOLOGY REPORT*  Clinical Data: Chest pain  CHEST - 2 VIEW  Comparison: 09/26/2011  Findings: Cardiomediastinal silhouette is stable.  Status post median sternotomy again noted.  Dextroscoliosis thoracic spine again noted.  No acute infiltrate or pulmonary edema.  Stable degenerative changes.  IMPRESSION: No active disease.  No significant change.   Original Report Authenticated By: Natasha Mead, M.D.    Ct Angio Chest Pe W/cm &/or Wo Cm  04/10/2012  *RADIOLOGY REPORT*  Clinical Data: 77 year old male with chest pain and elevated D- dimer.  CT ANGIOGRAPHY CHEST   Technique:  Multidetector CT imaging of the chest using the standard protocol during bolus administration of intravenous contrast. Multiplanar reconstructed images including MIPs were obtained and reviewed to evaluate the vascular anatomy.  Contrast: 75mL OMNIPAQUE IOHEXOL 350 MG/ML SOLN  Comparison: 04/09/2012 and prior chest radiographs  Findings: This is a technically adequate study but respiratory motion artifact in the lower lungs limits sensitivity.  No pulmonary emboli are identified.  Ectasia of the thoracic aorta identified within the ascending aorta measuring 3.8 cm and the descending aorta measuring 2.8 cm. Cardiomegaly, cardiac surgical changes and heavy coronary artery calcifications noted. There is no evidence of pleural or pericardial effusion. No enlarged lymph nodes are identified.  There is no evidence of airspace disease, consolidation, nodule, mass or endobronchial/endotracheal lesion. No acute or suspicious bony abnormalities are present.  Remote rib fractures are present. No upper abdominal abnormalities are identified except for cholelithiasis.  IMPRESSION: No evidence of acute abnormality - no evidence of pulmonary emboli.  Ectatic thoracic aorta, cardiomegaly and cardiac surgical changes.  Cholelithiasis.   Original Report Authenticated By: Harmon Pier, M.D.    US Abdomen Complete  04/11/2012  *RADIOLOGY REPORT*  Clinical Data:  77 year old male with abdominal pain, nausea vomiting and acute renal failure.  ABDOMINAL ULTRASOUND COMPLETE  Comparison:  06/13/2009 ultrasound  Findings: Body habitus and bowel gas limits evaluation of the liver, IVC, pancreas and abdominal aorta.  Gallbladder: A 15 mm round hyperechoic structure within the gallbladder is unchanged and may represent a polyp or gallstone.There is no evidence of gallbladder wall thickening, pericholecystic fluid or sonographic Murphy's sign.  Common Bile Duct:  There is no evidence of intrahepatic or extrahepatic biliary dilation.  The CBD measures 4.1 mm in greatest diameter.  Liver: Hepatic parenchyma is heterogeneous - unchanged. No focal abnormalities are identified.  IVC: Not visualized  Pancreas: Not visualized  Spleen:  Within normal limits in size and echotexture.  Right kidney:  The right kidney is atrophic with normal parenchymal echogenicity.  There is no evidence of solid mass, hydronephrosis or definite renal calculi.  The right kidney measures 7.8 cm.  Left kidney:  The left kidney is normal in size and parenchymal echogenicity.  There is no evidence of solid mass, hydronephrosis or definite renal calculi.   The left kidney measures 9.5 cm.  Abdominal Aorta: Not visualized  There is no evidence of ascites.  IMPRESSION: No evidence of acute abnormality.  Unchanged 15 mm round echogenic area within the gallbladder - question gallstone versus polyp. No evidence of cholecystitis.  Atrophic right kidney.  Pancreas, abdominal aorta, IVC and portions of the liver not well visualized as discussed above.   Original Report Authenticated By: Harmon Pier, M.D.     History of Present Illness:  Joe [name] is an 77 y.o. male with hx of HTN, hyperlipidemia, known CAD s/p CABG, anxiety, obesity, presents to the ER with chest pain. His complaint has been rather vague and inconsistent. He was saying that he regularly has intermittent CP, but then said he hadn't had any CP in 2 years. He has no pleuritic CP. No associated N/V, diaphoresis, or shortness of breath. Evaluation in the ER included a D-Dimer of 8.0, CTPA pending. Cr 1.66, K of 7.3, repeat 6.9, His EKG showed slight ST depression that's new. No QTc prolongation and no peak T's. He was painfree when I saw him. Hospitalist was asked to admit him for hyperkalemia, and for chest pain work up.  Hospital Course: Patient was admitted to the medical service by the hospitalist and serial cardiac enzymes demonstrated no evidence of injury. Patient was hydrated ultimately renal function and  electrolytes returned to baseline. Radiographic workup revealed no evidence of pulmonary embolus and no evidence of frank cholecystitis. Cardiology was consulted and this was not felt to be a pattern consistent with underlying cardiac etiology. Cardiology left recommendations for further be conducted as an outpatient through their office. Patient has been ambulating, eating, no distress clearly back to baseline. Of note, patient was initially admitted to the hospitalist service and subtotally transferred to my service via Dr. Evlyn Kanner under the exception that he was a patient of our practice. This patient is no longer a patient of [facility] as he transferred all records to Dr. Elbert Ewings. can in [place] and this patient will not be entitled to any further care under [facility] as an inpatient. I did inform him that he does need close followup in particular cardiac evaluation within one week so they can coordinate any further outpatient cardiac evaluation. I additionally informed him that he needs to followup with his primary care physician, Dr. Shelah Lewandowsky, for further GI workup given the epigastric nature this pain potentially to include further radiographic testing and/or an endoscopy. I did stress to him that both of these followup for his  obligation and he should not miss any of them.  Day of Discharge Exam BP 123/70  Pulse 53  Temp(Src) 98.1 F (36.7 C) (Oral)  Resp 18  Ht 5\' 3"  (1.6 m)  Wt 89.7 kg (197 lb 12 oz)  BMI 35.04 kg/m2  SpO2 100%  Physical Exam: General appearance: alert and cooperative Eyes: no scleral icterus Throat: oropharynx moist without erythema Resp: clear to auscultation bilaterally Cardio: regular rate and rhythm Extremities: no clubbing, cyanosis or edema Abdomen is soft nontender good bowel sounds no masses. Neurologically he is nonlateralizing higher cortical functioning intact  Discharge Labs:  Recent Labs  04/10/12 1531 04/10/12 1720 04/11/12 0620  04/12/12 0540  NA 132*  --  134* 134*  K 5.3*  --  4.9 5.3*  CL 103  --  103 105  CO2 14*  --  17* 18*  GLUCOSE 106*  --  90 80  BUN 36*  --  29* 27*  CREATININE 1.60*  --  1.49* 1.50*  CALCIUM 9.1  --  8.7 8.6  MG  --  1.4*  --   --     Recent Labs  04/10/12 1720 04/11/12 0620  AST 21 19  ALT 12 10  ALKPHOS 78 74  BILITOT 0.1* 0.2*  PROT 8.1 7.6  ALBUMIN 2.5* 2.2*    Recent Labs  04/11/12 0620 04/12/12 0540  WBC 6.3 7.1  HGB 9.0* 9.2*  HCT 27.2* 28.5*  MCV 80.5 82.6  PLT 315 301    Recent Labs  04/09/12 2249 04/10/12 0525 04/10/12 1030  TROPONINI <0.30 <0.30 <0.30   No results found for this basename: TSH, T4TOTAL, FREET3, T3FREE, THYROIDAB,  in the last 72 hours No results found for this basename: VITAMINB12, FOLATE, FERRITIN, TIBC, IRON, RETICCTPCT,  in the last 72 hours  Discharge instructions:     Discharge Orders   Future Orders Complete By Expires     Diet - low sodium heart healthy  As directed     Increase activity slowly  As directed        Disposition: Home  Follow-up Appts: I stressed to him that he needs to followup with cardiology within the week for further testing as they believe appropriate. I further stressed that he needs to follow up with Dr. Elbert Ewings. can within the week to set up further GI workup to include possible endoscopy and CT scanning to evaluate these epigastric symptoms. He is clearly symptom-free upon disposition of note continued need for inpatient care.  Condition on Discharge: Improved  Tests Needing Follow-up: Cardiology and primary care to determine most likely echo and/or Myoview and further GI workup to include endoscopy and/or CT scanning  Signed: Rosa Gambale A 04/12/2012, 12:44 PM

## 2013-03-21 ENCOUNTER — Encounter: Payer: Self-pay | Admitting: General Surgery

## 2013-05-04 ENCOUNTER — Ambulatory Visit: Payer: Medicare PPO | Admitting: Infectious Diseases

## 2013-07-03 IMAGING — CT CT TIBIA FIBULA *L* W/O CM
3 series · 16 of 33 positions shown, 19 images · IV contrast (APPLIED)
Comparison: 04/15/2011.

CLINICAL DATA: Leg redness.  Evaluate for abscess.

CT TIBIA FIBULA LEFT WITHOUT CONTRAST
TECHNIQUE: Contiguous noncontrast axial images were obtained of
the left leg from both the knee to below the ankle.  Coronal and
sagittal reconstructions were created in bone and soft tissue
windows.

[Series 2: soft tissue · axial · 0.53mm/px · z∈[-1280,-848]mm · 8 of 166 slices shown, 10 images]
[im 13/166  soft-tissue]
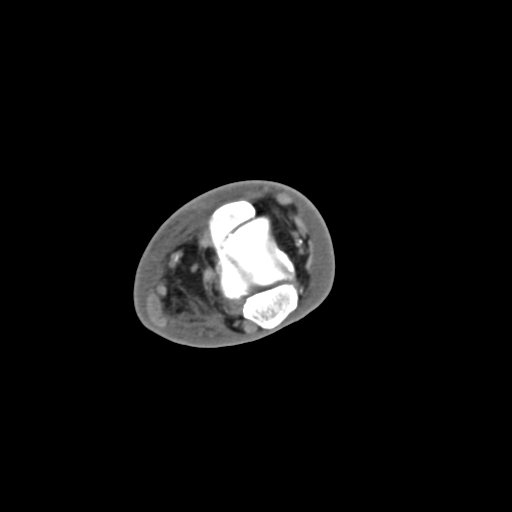
[im 13/166  bone]
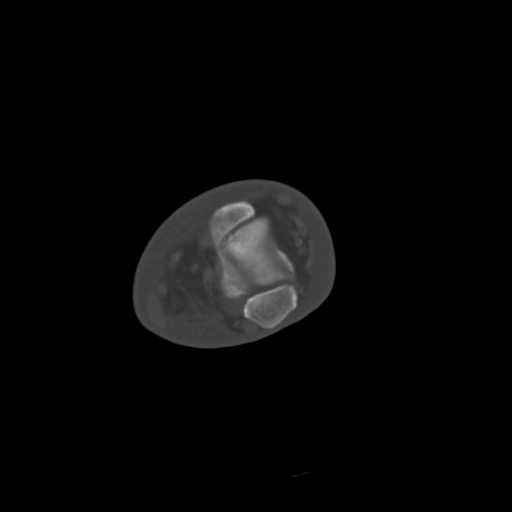
[im 39/166  bone]
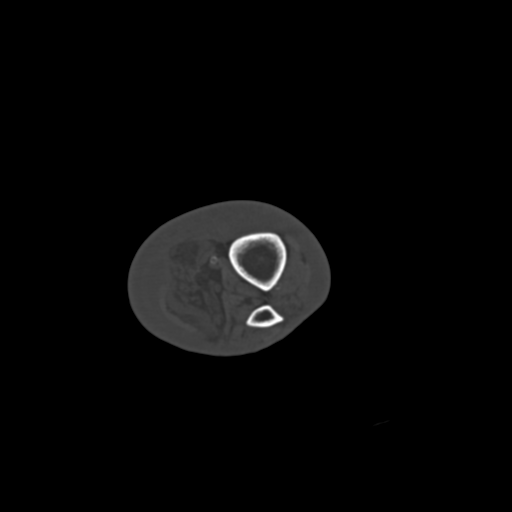
[im 51/166  bone]
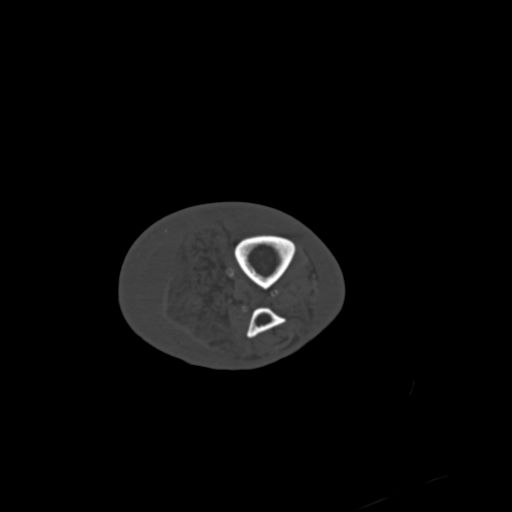
[im 77/166  bone]
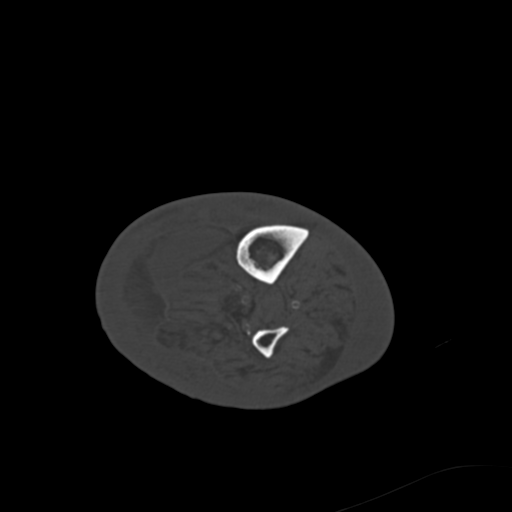
[im 89/166  soft-tissue]
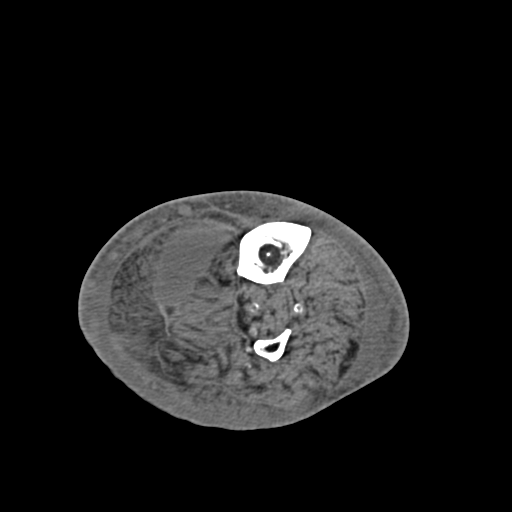
[im 89/166  bone]
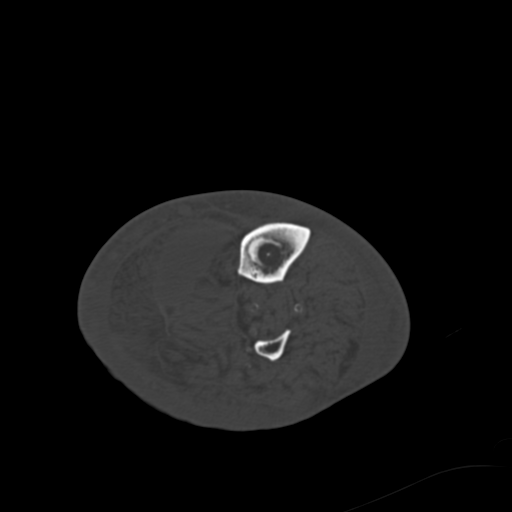
[im 115/166  bone]
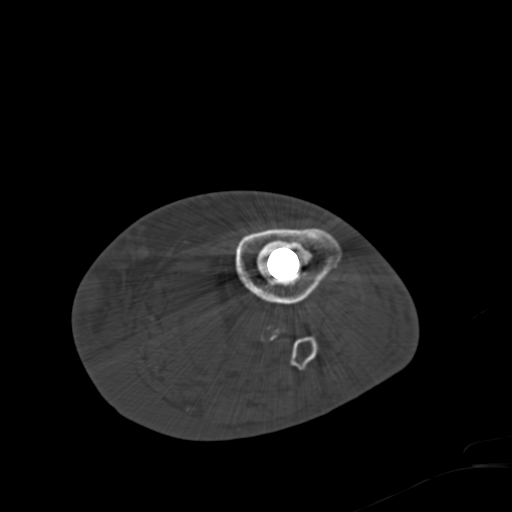
[im 127/166  bone]
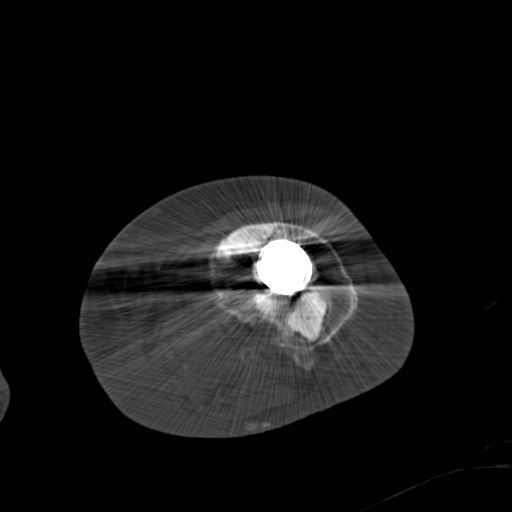
[im 153/166  bone]
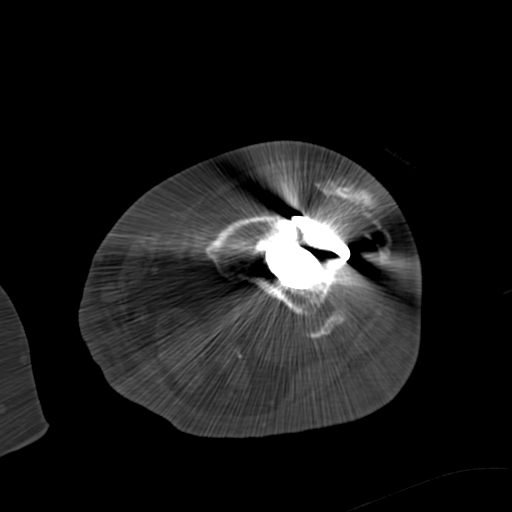

[Series 602: lt leg cor · coronal · 0.99mm/px · 3 of 86 slices shown]
[im 18/86  bone]
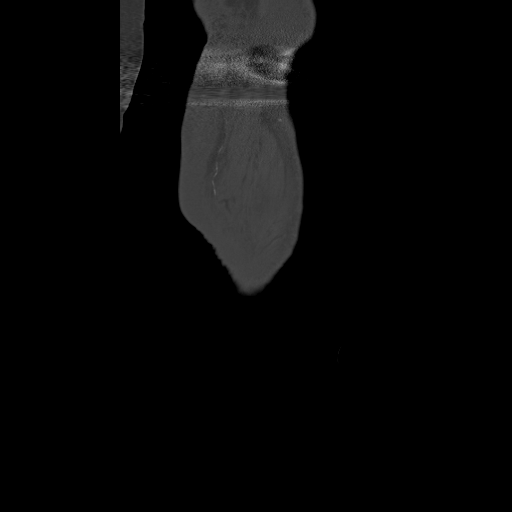
[im 35/86  bone]
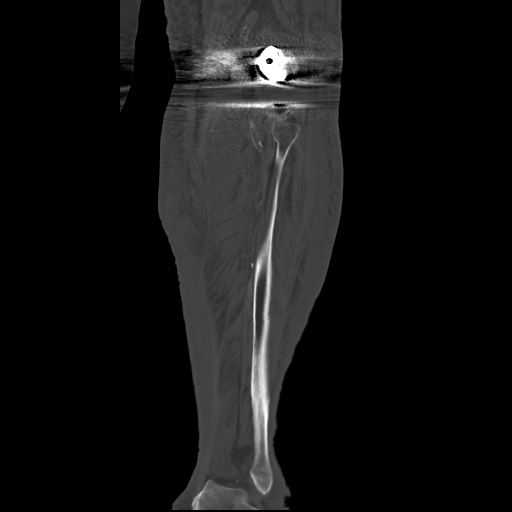
[im 52/86  bone]
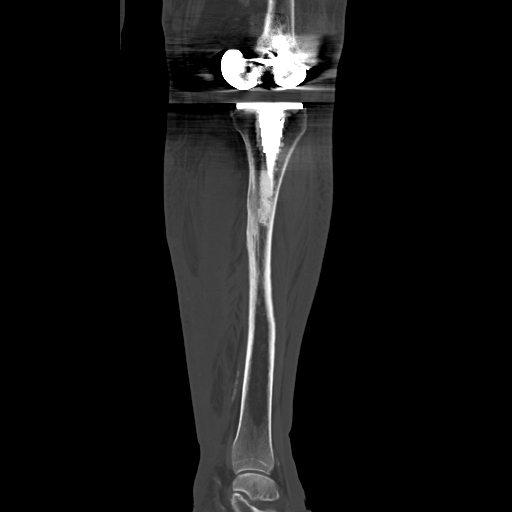

[Series 603: lt leg sag · sagittal · 0.99mm/px · 5 of 91 slices shown, 6 images]
[im 31/91  bone]
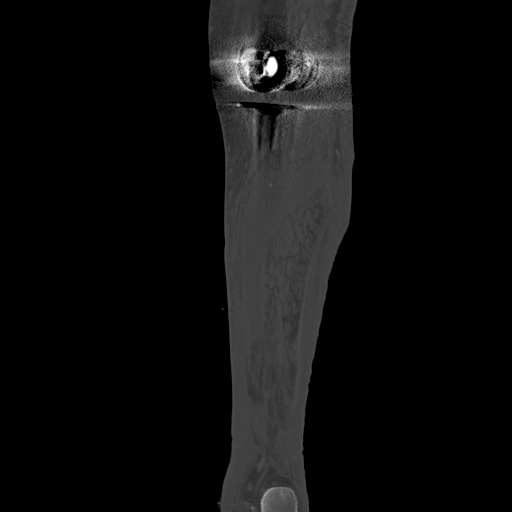
[im 38/91  bone]
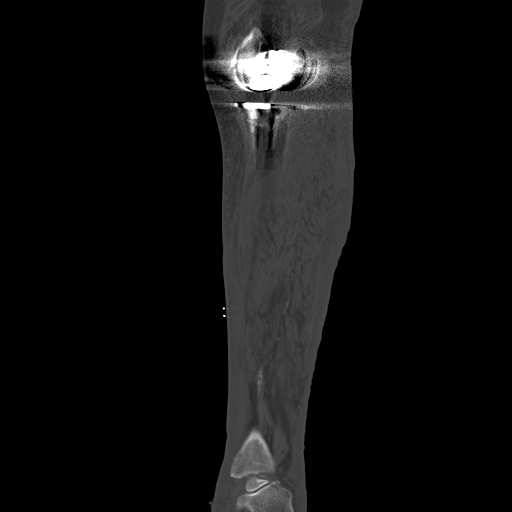
[im 46/91  soft-tissue]
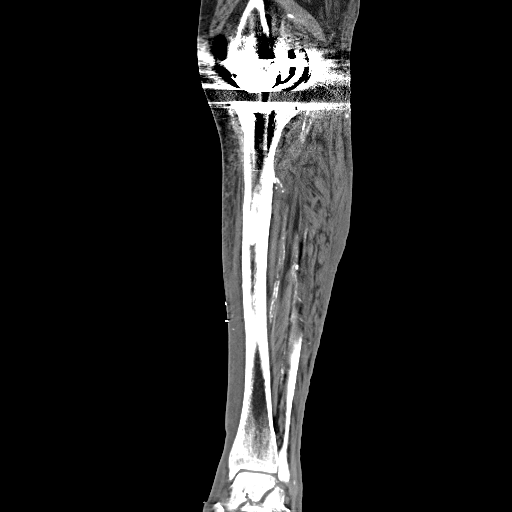
[im 46/91  bone]
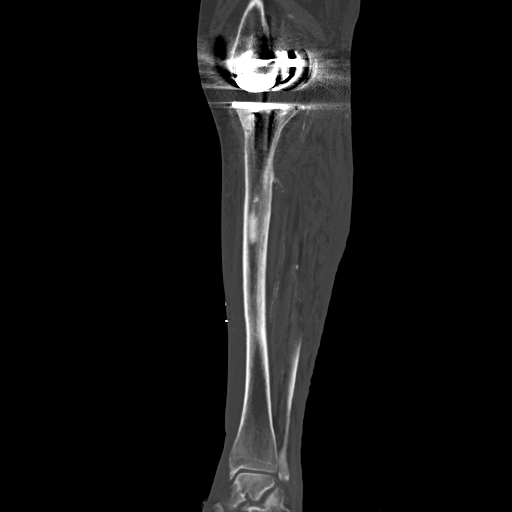
[im 53/91  bone]
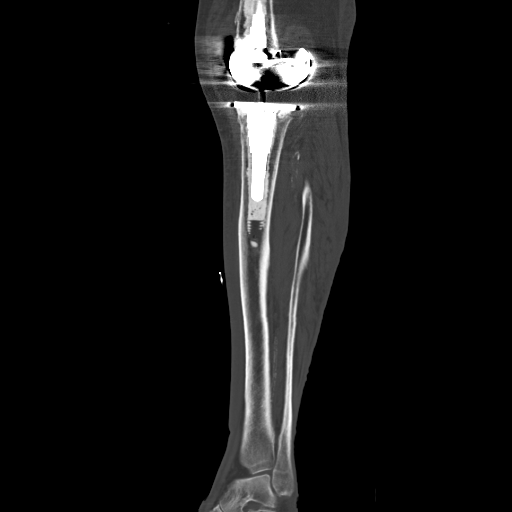
[im 61/91  bone]
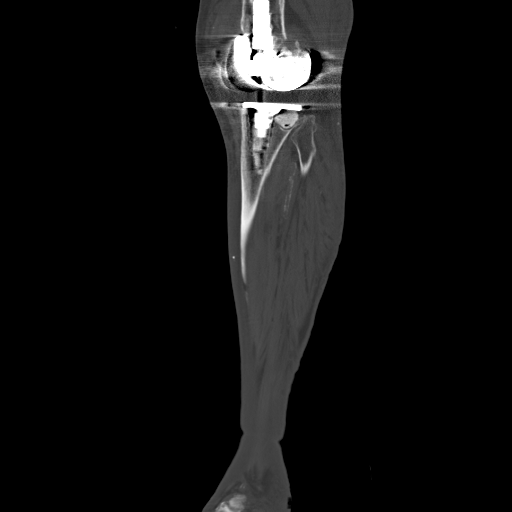

[16 of 33 positions shown; findings below may reference images not displayed]

FINDINGS: Please note that noncontrast technique gives suboptimal
visualization of vascular/enhancement of fluid.  Within this
limitation, there is pronounced infiltration of the dorsal
subcutaneous tissues of the leg.  Additionally, in the posterior
compartment, interposed between the medial head of the
gastrocnemius and the soleus muscle, there is a focal fluid
collection with a tiny locule of gas highly suspicious for multi
locular abscess.  On axial image number 65, this measures 78 mm AP
by 41 mm transverse.  Craniocaudal extent is 17 cm.  Medial leg
varicosities are present with phleboliths.  Severe below-the-knee
atherosclerosis.  The bones demonstrate osteolysis around the
cemented Constrained knee arthroplasty which may represent chronic
infection or particle disease.  Ordinary mechanical loosening is
considered less likely.  This is most pronounced around the
patellar component and femoral component.  There is also lucency
around the bone cement in the tibial plateau.
IMPRESSION: 1.  Elongated medial posterior compartment fluid collection with
gas locules suspicious for deep abscess interposed between the
medial head gastrocnemius and the soleus muscle.
2.  Severe subcutaneous edema/fluid.
3.  Osteolysis around the total knee arthroplasty, likely
representing either chronic infection or particle disease, with
mechanical loosening considered unlikely.

## 2013-09-13 ENCOUNTER — Encounter: Payer: Self-pay | Admitting: Infectious Diseases

## 2013-09-13 ENCOUNTER — Ambulatory Visit (INDEPENDENT_AMBULATORY_CARE_PROVIDER_SITE_OTHER): Payer: Medicare PPO | Admitting: Infectious Diseases

## 2013-09-13 VITALS — BP 136/66 | HR 52 | Temp 98.1°F | Ht 68.0 in | Wt 207.0 lb

## 2013-09-13 DIAGNOSIS — T8454XA Infection and inflammatory reaction due to internal left knee prosthesis, initial encounter: Secondary | ICD-10-CM | POA: Insufficient documentation

## 2013-09-13 DIAGNOSIS — T8450XA Infection and inflammatory reaction due to unspecified internal joint prosthesis, initial encounter: Secondary | ICD-10-CM

## 2013-09-13 DIAGNOSIS — Z96659 Presence of unspecified artificial knee joint: Secondary | ICD-10-CM

## 2013-09-13 MED ORDER — LEVOFLOXACIN 250 MG PO TABS: 250.0000 mg | ORAL_TABLET | Freq: Every day | ORAL | Status: AC

## 2013-09-13 NOTE — Assessment & Plan Note (Signed)
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 He is on rifampin alone, which is not typical rx for this. As noted previously, his infection is most likely resistant to this.  Will change him to levaquin  daily.  Will defer to his orthopedist and his PCP as to whether or not he needs resection of his TKR. It seems unlikely that he will ever be "cured" and that suppressive therapy (if he does not have surgery) would be his best option.  Will see him back in 3-4 months.

## 2013-09-13 NOTE — Progress Notes (Signed)
   Subjective:    Patient ID: Joe [name], male    DOB: [birth year], 78 y.o.   MRN: [id number]  HPI 78 y.o. male who has undergone multiple procedures on his left knee. He had an initial arthroplasty and then required a revision arthroplasty many years ago. In March 2013, he presented with a calf abscess and cultures from the prosthetic grew group G. strep and coagulase-negative staph. He was treated with 6 weeks of IV vancomycin and oral rifampin. It appears that he was continued on oral therapy beyond that time but it is unclear to me what exactly what he was taking. One note indicated he was on oral rifampin and another indicated he was on oral doxycycline.  He returned to the hospital 09-25-11 with worsening swelling of his knee. The joint was tapped as an outpatient and cultures are grew group G. strep. He was admitted and underwent incision and drainage with poly-exchange. He was treated with 6 weeks of vanco. He was planned to have outpt ID f/u to change to a long term suppressive rx but there is no record of his f/u.  His care since then has been marked by alzheimer's.  His notes show that he is again on rifampin monotherapy (since 2012).   Today he states that his knee continues to drain. That occasionally it heals and then a bubble forms and then "pops". No fever or chills. No proximal erythema.   States he is taking rifmapin but on further questioning believes that he has already stopped it.  He is not clear when his lat f/u with Dr Darrelyn Hillock was.   He is states he is living in [place], with his wife. Former Arts development officer at [employer]  SOChx/FHx-reviewed.   Review of Systems  Constitutional: Negative for appetite change and unexpected weight change.  Gastrointestinal: Negative for diarrhea and constipation.  Genitourinary: Negative for difficulty urinating.  he smells strongly of urine.      Objective:   Physical Exam  Constitutional: He appears well-developed and well-nourished.    HENT:  Mouth/Throat: No oropharyngeal exudate.  Eyes: EOM are normal. Pupils are equal, round, and reactive to light.  Neck: Neck supple.  Cardiovascular: Normal rate, regular rhythm and normal heart sounds.   Pulmonary/Chest: Effort normal and breath sounds normal.  Abdominal: Soft. Bowel sounds are normal. There is no tenderness.  Musculoskeletal:       Legs: Lymphadenopathy:    He has no cervical adenopathy.  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

## 2013-12-19 ENCOUNTER — Ambulatory Visit: Payer: Medicare PPO | Admitting: Infectious Diseases

## 2013-12-19 ENCOUNTER — Telehealth: Payer: Self-pay | Admitting: *Deleted

## 2013-12-19 NOTE — Telephone Encounter (Signed)
error 

## 2016-10-20 DEATH — deceased
